# Patient Record
Sex: Female | Born: 1940
Health system: Southern US, Community
[De-identification: ages and names within clinical notes are randomized; demographics above are authoritative.]

## PROBLEM LIST (undated history)

## (undated) DIAGNOSIS — I2510 Atherosclerotic heart disease of native coronary artery without angina pectoris: Secondary | ICD-10-CM

## (undated) DIAGNOSIS — I10 Essential (primary) hypertension: Secondary | ICD-10-CM

## (undated) DIAGNOSIS — K089 Disorder of teeth and supporting structures, unspecified: Secondary | ICD-10-CM

## (undated) DIAGNOSIS — Z720 Tobacco use: Secondary | ICD-10-CM

## (undated) DIAGNOSIS — J45909 Unspecified asthma, uncomplicated: Secondary | ICD-10-CM

## (undated) DIAGNOSIS — F32A Depression, unspecified: Secondary | ICD-10-CM

## (undated) DIAGNOSIS — E079 Disorder of thyroid, unspecified: Secondary | ICD-10-CM

## (undated) DIAGNOSIS — F329 Major depressive disorder, single episode, unspecified: Secondary | ICD-10-CM

## (undated) DIAGNOSIS — J449 Chronic obstructive pulmonary disease, unspecified: Secondary | ICD-10-CM

## (undated) DIAGNOSIS — H269 Unspecified cataract: Secondary | ICD-10-CM

## (undated) HISTORY — PX: THYROID SURGERY: SHX805

## (undated) HISTORY — DX: Unspecified cataract: H26.9

## (undated) HISTORY — DX: Major depressive disorder, single episode, unspecified: F32.9

## (undated) HISTORY — DX: Atherosclerotic heart disease of native coronary artery without angina pectoris: I25.10

## (undated) HISTORY — DX: Unspecified asthma, uncomplicated: J45.909

## (undated) HISTORY — PX: ABDOMINAL HYSTERECTOMY: SHX81

## (undated) HISTORY — DX: Depression, unspecified: F32.A

## (undated) HISTORY — DX: Essential (primary) hypertension: I10

---

## 2011-05-31 ENCOUNTER — Other Ambulatory Visit: Payer: Self-pay | Admitting: Family Medicine

## 2011-05-31 DIAGNOSIS — J449 Chronic obstructive pulmonary disease, unspecified: Secondary | ICD-10-CM

## 2012-10-07 ENCOUNTER — Ambulatory Visit (HOSPITAL_COMMUNITY): Admit: 2012-10-07 | Payer: Self-pay | Admitting: Cardiology

## 2012-10-07 ENCOUNTER — Encounter (HOSPITAL_COMMUNITY): Admission: EM | Disposition: A | Discharge status: Home / Self Care | Payer: Self-pay | Source: Home / Self Care

## 2012-10-07 ENCOUNTER — Encounter (HOSPITAL_COMMUNITY): Payer: Self-pay

## 2012-10-07 ENCOUNTER — Inpatient Hospital Stay (HOSPITAL_COMMUNITY)
Admission: EM | Admit: 2012-10-07 | Discharge: 2012-10-09 | DRG: 282 | Disposition: A | Discharge status: Home / Self Care | Payer: Medicare HMO | Attending: Emergency Medicine | Admitting: Emergency Medicine

## 2012-10-07 DIAGNOSIS — Z7982 Long term (current) use of aspirin: Secondary | ICD-10-CM

## 2012-10-07 DIAGNOSIS — J449 Chronic obstructive pulmonary disease, unspecified: Secondary | ICD-10-CM | POA: Diagnosis present

## 2012-10-07 DIAGNOSIS — Z7902 Long term (current) use of antithrombotics/antiplatelets: Secondary | ICD-10-CM

## 2012-10-07 DIAGNOSIS — Z79899 Other long term (current) drug therapy: Secondary | ICD-10-CM

## 2012-10-07 DIAGNOSIS — I2129 ST elevation (STEMI) myocardial infarction involving other sites: Principal | ICD-10-CM | POA: Diagnosis present

## 2012-10-07 DIAGNOSIS — Z880 Allergy status to penicillin: Secondary | ICD-10-CM

## 2012-10-07 DIAGNOSIS — I10 Essential (primary) hypertension: Secondary | ICD-10-CM | POA: Diagnosis present

## 2012-10-07 DIAGNOSIS — I213 ST elevation (STEMI) myocardial infarction of unspecified site: Secondary | ICD-10-CM

## 2012-10-07 DIAGNOSIS — J4489 Other specified chronic obstructive pulmonary disease: Secondary | ICD-10-CM | POA: Diagnosis present

## 2012-10-07 DIAGNOSIS — F17200 Nicotine dependence, unspecified, uncomplicated: Secondary | ICD-10-CM | POA: Diagnosis present

## 2012-10-07 DIAGNOSIS — Z882 Allergy status to sulfonamides status: Secondary | ICD-10-CM

## 2012-10-07 DIAGNOSIS — E039 Hypothyroidism, unspecified: Secondary | ICD-10-CM | POA: Diagnosis present

## 2012-10-07 DIAGNOSIS — M1990 Unspecified osteoarthritis, unspecified site: Secondary | ICD-10-CM | POA: Diagnosis present

## 2012-10-07 DIAGNOSIS — I2510 Atherosclerotic heart disease of native coronary artery without angina pectoris: Secondary | ICD-10-CM | POA: Diagnosis present

## 2012-10-07 DIAGNOSIS — E7800 Pure hypercholesterolemia, unspecified: Secondary | ICD-10-CM | POA: Diagnosis present

## 2012-10-07 HISTORY — PX: LEFT HEART CATHETERIZATION WITH CORONARY ANGIOGRAM: SHX5451

## 2012-10-07 HISTORY — DX: Chronic obstructive pulmonary disease, unspecified: J44.9

## 2012-10-07 HISTORY — DX: Disorder of thyroid, unspecified: E07.9

## 2012-10-07 LAB — CBC WITH DIFFERENTIAL/PLATELET
Basophils Absolute: 0 10*3/uL (ref 0.0–0.1)
Basophils Relative: 0 % (ref 0–1)
Eosinophils Absolute: 0.1 10*3/uL (ref 0.0–0.7)
Eosinophils Relative: 2 % (ref 0–5)
HCT: 36 % (ref 36.0–46.0)
Hemoglobin: 12.6 g/dL (ref 12.0–15.0)
Lymphocytes Relative: 25 % (ref 12–46)
Lymphs Abs: 1.8 10*3/uL (ref 0.7–4.0)
MCH: 30.7 pg (ref 26.0–34.0)
MCHC: 35 g/dL (ref 30.0–36.0)
MCV: 87.8 fL (ref 78.0–100.0)
Monocytes Absolute: 0.4 10*3/uL (ref 0.1–1.0)
Monocytes Relative: 6 % (ref 3–12)
Neutro Abs: 4.7 10*3/uL (ref 1.7–7.7)
Neutrophils Relative %: 67 % (ref 43–77)
Platelets: 222 10*3/uL (ref 150–400)
RBC: 4.1 MIL/uL (ref 3.87–5.11)
RDW: 13.2 % (ref 11.5–15.5)
WBC: 7.1 10*3/uL (ref 4.0–10.5)

## 2012-10-07 LAB — COMPREHENSIVE METABOLIC PANEL
ALT: 19 U/L (ref 0–35)
AST: 116 U/L — ABNORMAL HIGH (ref 0–37)
Albumin: 3.4 g/dL — ABNORMAL LOW (ref 3.5–5.2)
Alkaline Phosphatase: 87 U/L (ref 39–117)
BUN: 14 mg/dL (ref 6–23)
CO2: 22 mEq/L (ref 19–32)
Calcium: 8.7 mg/dL (ref 8.4–10.5)
Chloride: 106 mEq/L (ref 96–112)
Creatinine, Ser: 0.68 mg/dL (ref 0.50–1.10)
GFR calc Af Amer: >90 mL/min (ref >90)
GFR calc non Af Amer: 87 mL/min — ABNORMAL LOW (ref >90)
Glucose, Bld: 105 mg/dL — ABNORMAL HIGH (ref 70–99)
Potassium: 3.7 mEq/L (ref 3.5–5.1)
Sodium: 137 mEq/L (ref 135–145)
Total Bilirubin: 0.5 mg/dL (ref 0.3–1.2)
Total Protein: 6 g/dL (ref 6.0–8.3)

## 2012-10-07 LAB — POCT I-STAT, CHEM 8
BUN: 17 mg/dL (ref 6–23)
Calcium, Ion: 1.19 mmol/L (ref 1.13–1.30)
Chloride: 109 mEq/L (ref 96–112)
Creatinine, Ser: 0.7 mg/dL (ref 0.50–1.10)
Glucose, Bld: 105 mg/dL — ABNORMAL HIGH (ref 70–99)
HCT: 34 % — ABNORMAL LOW (ref 36.0–46.0)
Hemoglobin: 11.6 g/dL — ABNORMAL LOW (ref 12.0–15.0)
Potassium: 3.5 mEq/L (ref 3.5–5.1)
Sodium: 141 mEq/L (ref 135–145)
TCO2: 19 mmol/L (ref 0–100)

## 2012-10-07 LAB — APTT: aPTT: 40 seconds — ABNORMAL HIGH (ref 24–37)

## 2012-10-07 LAB — POCT ACTIVATED CLOTTING TIME
Activated Clotting Time: 459 seconds
Activated Clotting Time: 129 seconds

## 2012-10-07 LAB — TROPONIN I: Troponin I: >20 ng/mL (ref <0.30)

## 2012-10-07 LAB — PROTIME-INR
INR: 1.14 (ref 0.00–1.49)
Prothrombin Time: 14.4 seconds (ref 11.6–15.2)

## 2012-10-07 LAB — MAGNESIUM: Magnesium: 2 mg/dL (ref 1.5–2.5)

## 2012-10-07 LAB — MRSA PCR SCREENING: MRSA by PCR: NEGATIVE

## 2012-10-07 SURGERY — LEFT HEART CATHETERIZATION WITH CORONARY ANGIOGRAM
Anesthesia: LOCAL

## 2012-10-07 MED ORDER — MIDAZOLAM HCL 2 MG/2ML IJ SOLN
INTRAMUSCULAR | Status: AC
  Filled 2012-10-07: qty 2

## 2012-10-07 MED ORDER — NITROGLYCERIN 0.2 MG/ML ON CALL CATH LAB
INTRAVENOUS | Status: AC
  Filled 2012-10-07: qty 1

## 2012-10-07 MED ORDER — ONDANSETRON HCL 4 MG/2ML IJ SOLN
4.0000 mg | Freq: Four times a day (QID) | INTRAMUSCULAR | Status: DC | PRN
  Administered 2012-10-07: 4 mg via INTRAVENOUS
  Filled 2012-10-07: qty 2

## 2012-10-07 MED ORDER — ASPIRIN 81 MG PO CHEW: 324.0000 mg | CHEWABLE_TABLET | ORAL | Status: AC

## 2012-10-07 MED ORDER — MORPHINE SULFATE 2 MG/ML IJ SOLN
2.0000 mg | Freq: Once | INTRAMUSCULAR | Status: AC
  Administered 2012-10-07: 2 mg via INTRAVENOUS
  Filled 2012-10-07: qty 1

## 2012-10-07 MED ORDER — PANTOPRAZOLE SODIUM 40 MG PO TBEC
40.0000 mg | DELAYED_RELEASE_TABLET | Freq: Every day | ORAL | Status: DC
  Administered 2012-10-08 – 2012-10-09 (×2): 40 mg via ORAL
  Filled 2012-10-07 (×2): qty 1

## 2012-10-07 MED ORDER — ATROPINE SULFATE 1 MG/ML IJ SOLN
INTRAMUSCULAR | Status: AC
  Filled 2012-10-07: qty 1

## 2012-10-07 MED ORDER — ACETAMINOPHEN 325 MG PO TABS: 650.0000 mg | ORAL_TABLET | ORAL | Status: DC | PRN

## 2012-10-07 MED ORDER — ATORVASTATIN CALCIUM 80 MG PO TABS
80.0000 mg | ORAL_TABLET | Freq: Every day | ORAL | Status: DC
  Administered 2012-10-08: 80 mg via ORAL
  Filled 2012-10-07 (×3): qty 1

## 2012-10-07 MED ORDER — LIDOCAINE HCL (PF) 1 % IJ SOLN
INTRAMUSCULAR | Status: AC
  Filled 2012-10-07: qty 30

## 2012-10-07 MED ORDER — SODIUM CHLORIDE 0.9 % IV SOLN
INTRAVENOUS | Status: DC
  Administered 2012-10-07: 17:00:00 via INTRAVENOUS

## 2012-10-07 MED ORDER — NITROGLYCERIN IN D5W 200-5 MCG/ML-% IV SOLN
INTRAVENOUS | Status: AC
  Administered 2012-10-07: 10 ug/min via INTRAVENOUS
  Filled 2012-10-07: qty 250

## 2012-10-07 MED ORDER — HEPARIN SODIUM (PORCINE) 1000 UNIT/ML DIALYSIS
60.0000 [IU]/kg | Freq: Once | INTRAMUSCULAR | Status: AC
  Administered 2012-10-07: 4000 [IU] via INTRAVENOUS

## 2012-10-07 MED ORDER — METOPROLOL TARTRATE 12.5 MG HALF TABLET
12.5000 mg | ORAL_TABLET | Freq: Two times a day (BID) | ORAL | Status: DC
  Administered 2012-10-07 – 2012-10-09 (×4): 12.5 mg via ORAL
  Filled 2012-10-07 (×5): qty 1

## 2012-10-07 MED ORDER — NITROGLYCERIN IN D5W 200-5 MCG/ML-% IV SOLN
10.0000 ug/min | INTRAVENOUS | Status: DC
  Administered 2012-10-07: 10 ug/min via INTRAVENOUS

## 2012-10-07 MED ORDER — RAMIPRIL 5 MG PO CAPS
5.0000 mg | ORAL_CAPSULE | Freq: Every day | ORAL | Status: DC
  Administered 2012-10-07 – 2012-10-09 (×3): 5 mg via ORAL
  Filled 2012-10-07 (×3): qty 1

## 2012-10-07 MED ORDER — ACETAMINOPHEN 325 MG PO TABS
650.0000 mg | ORAL_TABLET | ORAL | Status: DC | PRN
  Administered 2012-10-07 – 2012-10-09 (×3): 650 mg via ORAL
  Filled 2012-10-07: qty 1
  Filled 2012-10-07 (×3): qty 2

## 2012-10-07 MED ORDER — ASPIRIN 300 MG RE SUPP
300.0000 mg | RECTAL | Status: DC
  Filled 2012-10-07: qty 1

## 2012-10-07 MED ORDER — ONDANSETRON HCL 4 MG/2ML IJ SOLN: 4.0000 mg | Freq: Four times a day (QID) | INTRAMUSCULAR | Status: DC | PRN

## 2012-10-07 MED ORDER — ASPIRIN EC 81 MG PO TBEC
81.0000 mg | DELAYED_RELEASE_TABLET | Freq: Every day | ORAL | Status: DC
  Administered 2012-10-08 – 2012-10-09 (×2): 81 mg via ORAL
  Filled 2012-10-07 (×2): qty 1

## 2012-10-07 MED ORDER — NITROGLYCERIN 0.4 MG SL SUBL: 0.4000 mg | SUBLINGUAL_TABLET | SUBLINGUAL | Status: DC | PRN

## 2012-10-07 MED ORDER — FENTANYL CITRATE 0.05 MG/ML IJ SOLN
INTRAMUSCULAR | Status: AC
  Filled 2012-10-07: qty 2

## 2012-10-07 MED ORDER — BIVALIRUDIN 250 MG IV SOLR
INTRAVENOUS | Status: AC
  Filled 2012-10-07: qty 250

## 2012-10-07 NOTE — ED Provider Notes (Signed)
History     CSN: [id number]  Arrival date & time 10/07/12  1418   First MD Initiated Contact with Patient 10/07/12 1438      Chief Complaint  Patient presents with  . Chest Pain     HPI Sudden onset chest pain with nausea and sob  Given asa and ntg enroute   Past Medical History  Diagnosis Date  . Thyroid disease   . COPD (chronic obstructive pulmonary disease)     Past Surgical History  Procedure Date  . Thyroid surgery   . Abdominal hysterectomy     History reviewed. No pertinent family history.  History  Substance Use Topics  . Smoking status: Current Some Day Smoker  . Smokeless tobacco: Not on file  . Alcohol Use: No    OB History    Grav Para Term Preterm Abortions TAB SAB Ect Mult Living                  Review of Systems Level V caveat Allergies  Coconut fatty acids; Penicillins; and Sulfa antibiotics  Home Medications  No current outpatient prescriptions on file.  BP 150/82  Pulse 58  Temp 98 F (36.7 C) (Oral)  Resp 16  Ht 5\' 9"  (1.753 m)  Wt 163 lb 2.3 oz (74 kg)  BMI 24.09 kg/m2  SpO2 98%  Physical Exam  Nursing note and vitals reviewed. Constitutional: She is oriented to person, place, and time. She appears well-developed and well-nourished. No distress.  HENT:  Head: Normocephalic and atraumatic.  Eyes: Pupils are equal, round, and reactive to light.  Neck: Normal range of motion.  Cardiovascular: Normal rate and intact distal pulses.   Pulmonary/Chest: No respiratory distress.  Abdominal: Normal appearance. She exhibits no distension. There is no tenderness. There is no rebound.  Musculoskeletal: Normal range of motion.  Neurological: She is alert and oriented to person, place, and time. No cranial nerve deficit.  Skin: Skin is warm and dry. No rash noted.  Psychiatric: She has a normal mood and affect. Her behavior is normal.    ED Course  Procedures (including critical care time) CRITICAL CARE Performed by:  Nelva Nay L   Total critical care time: 30 min  Critical care time was exclusive of separately billable procedures and treating other patients.  Critical care was necessary to treat or prevent imminent or life-threatening deterioration.  Critical care was time spent personally by me on the following activities: development of treatment plan with patient and/or surrogate as well as nursing, discussions with consultants, evaluation of patient's response to treatment, examination of patient, obtaining history from patient or surrogate, ordering and performing treatments and interventions, ordering and review of laboratory studies, ordering and review of radiographic studies, pulse oximetry and re-evaluation of patient's condition.  Labs Reviewed  POCT I-STAT, CHEM 8 - Abnormal; Notable for the following:    Glucose, Bld 105 (*)     Hemoglobin 11.6 (*)     HCT 34.0 (*)     All other components within normal limits  TROPONIN I - Abnormal; Notable for the following:    Troponin I >20.00 (*)     All other components within normal limits  APTT - Abnormal; Notable for the following:    aPTT 40 (*)     All other components within normal limits  COMPREHENSIVE METABOLIC PANEL - Abnormal; Notable for the following:    Glucose, Bld 105 (*)     Albumin 3.4 (*)     AST 116 (*)  GFR calc non Af Amer 87 (*)     All other components within normal limits  POCT ACTIVATED CLOTTING TIME  PROTIME-INR  CBC WITH DIFFERENTIAL  MAGNESIUM  POCT ACTIVATED CLOTTING TIME  MRSA PCR SCREENING  TROPONIN I  TROPONIN I  TSH  CBC  BASIC METABOLIC PANEL  LIPID PANEL  ekg showed STEMI.  Code stemi called.  Dr Sharyn Lull took pt to cath lab.   No results found.   1. STEMI (ST elevation myocardial infarction)       MDM          Nelia Shi, MD 10/07/12 2356

## 2012-10-07 NOTE — H&P (Signed)
Phyllis Caldwell is an 71 y.o. female.   Chief Complaint: Chest pain  HPI: Patient is 71 year old female with past rectal history significant for hypertension, tobacco abuse, degenerative joint disease, hypothyroidism, came to the ER by EMS complaining of retrosternal chest pain described as pressure associated with nausea and diaphoresis her EKG done on the field showed a sinus bradycardia with ST elevation in lead 1 aVL and respiratory changes in lead 23 aVF and V4 to V6 suggestive of acute high lateral wall injury. Patient received aspirin and heparin and sublingual nitroglycerin in ED with relief of chest pain.Code STEMI  WAS CALLED. Patient denies such episodes of chest pain in the past denies any palpitation lightheadedness or syncope denies PND orthopnea leg swelling states takes blood pressure medicine occasionally off and on but does not recall the name of medications.  Past Medical History  Diagnosis Date  . Thyroid disease     Past Surgical History  Procedure Date  . Thyroid surgery   . Abdominal hysterectomy     History reviewed. No pertinent family history. Social History:  reports that she has been smoking.  She does not have any smokeless tobacco history on file. She reports that she does not drink alcohol. Her drug history not on file.  Allergies:  Allergies  Allergen Reactions  . Penicillins   . Sulfa Antibiotics     No prescriptions prior to admission    Results for orders placed during the hospital encounter of 10/07/12 (from the past 48 hour(s))  POCT ACTIVATED CLOTTING TIME     Status: Normal   Collection Time   10/07/12  3:07 PM      Component Value Range Comment   Activated Clotting Time 459      No results found.  Review of Systems  Constitutional: Negative for fever and weight loss.  Eyes: Negative for blurred vision and double vision.  Respiratory: Positive for shortness of breath. Negative for cough, hemoptysis and sputum production.     Cardiovascular: Positive for chest pain. Negative for palpitations, orthopnea, claudication, leg swelling and PND.  Gastrointestinal: Negative for heartburn, nausea and vomiting.  Neurological: Negative for dizziness and headaches.    Blood pressure 155/103, pulse 43, temperature 97.9 F (36.6 C), temperature source Oral, resp. rate 18, SpO2 99.00%. Physical Exam  Constitutional: She is oriented to person, place, and time. She appears well-developed and well-nourished.  HENT:  Head: Normocephalic and atraumatic.  Eyes: Conjunctivae normal and EOM are normal. Left eye exhibits no discharge.  Neck: Neck supple. No JVD present. No tracheal deviation present. No thyromegaly present.  Cardiovascular: Normal rate and regular rhythm.        Soft systolic murmur and S4 gallop noted  Respiratory: Effort normal and breath sounds normal. No respiratory distress. She has no wheezes. She has no rales.  GI: Soft. Bowel sounds are normal. She exhibits no distension. There is no tenderness. There is no rebound and no guarding.  Musculoskeletal: She exhibits no edema and no tenderness.  Neurological: She is alert and oriented to person, place, and time.     Assessment/Plan Acute high lateral wall myocardial infarction Uncontrolled hypertension Tobacco abuse Hypothyroidism Degenerative joint disease Plan Discussed with patient regarding emergency left cath possible PTCA stenting its risk and benefits i.e. death MI stroke need for emergency CABG local last complications etc. and consents for PCI.  Burnie Therien N 10/07/2012, 3:50 PM

## 2012-10-07 NOTE — CV Procedure (Signed)
Left cardiac cath report dictated on 10/07/2012 dictation number is [id number]

## 2012-10-07 NOTE — Progress Notes (Signed)
Right groin femoral sheath removed .  Pt pain withsheath pull .  Hematoma formed at sheath site.  Two nurses holding pressure.  Pam alvord and self.  Level 0 after holding 25 min.  Dressing applied.  vss throughout sheath pull. Pt c/o nausea during sheath pull.  Bedrest start at 1830pm

## 2012-10-07 NOTE — ED Notes (Signed)
Stemi paged per Dr. Radford Pax.

## 2012-10-07 NOTE — Progress Notes (Signed)
10/07/12 2100  Clinical Encounter Type  Visited With Patient  Visit Type Critical Care     10/07/12 2100  Clinical Encounter Type  Visited With Patient  Visit Type Critical Care   Visited patient in the CCU.  Vonzella Nipple, Chaplain.

## 2012-10-07 NOTE — ED Notes (Signed)
Pt from home at rest, right chest pain with nausea and SOB

## 2012-10-08 LAB — CBC
HCT: 34.7 % — ABNORMAL LOW (ref 36.0–46.0)
Hemoglobin: 11.6 g/dL — ABNORMAL LOW (ref 12.0–15.0)
MCH: 29.9 pg (ref 26.0–34.0)
MCHC: 33.4 g/dL (ref 30.0–36.0)
MCV: 89.4 fL (ref 78.0–100.0)
Platelets: 219 10*3/uL (ref 150–400)
RBC: 3.88 MIL/uL (ref 3.87–5.11)
RDW: 13.5 % (ref 11.5–15.5)
WBC: 8.6 10*3/uL (ref 4.0–10.5)

## 2012-10-08 LAB — BASIC METABOLIC PANEL
BUN: 14 mg/dL (ref 6–23)
CO2: 22 mEq/L (ref 19–32)
Calcium: 9.1 mg/dL (ref 8.4–10.5)
Chloride: 106 mEq/L (ref 96–112)
Creatinine, Ser: 0.84 mg/dL (ref 0.50–1.10)
GFR calc Af Amer: 80 mL/min — ABNORMAL LOW (ref >90)
GFR calc non Af Amer: 69 mL/min — ABNORMAL LOW (ref >90)
Glucose, Bld: 101 mg/dL — ABNORMAL HIGH (ref 70–99)
Potassium: 3.6 mEq/L (ref 3.5–5.1)
Sodium: 139 mEq/L (ref 135–145)

## 2012-10-08 LAB — TROPONIN I
Troponin I: >20 ng/mL (ref <0.30)
Troponin I: >20 ng/mL (ref <0.30)

## 2012-10-08 LAB — LIPID PANEL
Cholesterol: 161 mg/dL (ref 0–200)
HDL: 40 mg/dL (ref >39)
LDL Cholesterol: 97 mg/dL (ref 0–99)
Total CHOL/HDL Ratio: 4 RATIO
Triglycerides: 120 mg/dL (ref <150)
VLDL: 24 mg/dL (ref 0–40)

## 2012-10-08 LAB — TSH: TSH: 0.879 u[IU]/mL (ref 0.350–4.500)

## 2012-10-08 MED ORDER — CLOPIDOGREL BISULFATE 75 MG PO TABS
75.0000 mg | ORAL_TABLET | Freq: Every day | ORAL | Status: DC
  Administered 2012-10-08 – 2012-10-09 (×2): 75 mg via ORAL
  Filled 2012-10-08 (×2): qty 1

## 2012-10-08 MED FILL — Dextrose Inj 5%: INTRAVENOUS | Qty: 50 | Status: AC

## 2012-10-08 NOTE — Progress Notes (Addendum)
Pt having active chest tightness at 1000. Will f/u tomorrow.  Ethelda Chick CES, ACSM

## 2012-10-08 NOTE — Op Note (Signed)
NAMEROZELIA, CATAPANO NO.:  [id number]  MEDICAL RECORD NO.:  [id number]  LOCATION:  2901                         FACILITY:  [facility]  PHYSICIAN:  Eduardo Osier. Sharyn Lull, M.D. DATE OF BIRTH:  [birth year]  DATE OF PROCEDURE:  10/07/2012 DATE OF DISCHARGE:                              OPERATIVE REPORT   PROCEDURE:  Left cardiac cath with selective left and right coronary angiography, left ventriculography via right groin using Judkins technique.  INDICATION FOR THE PROCEDURE:  Ms. Pember is a 71 year old female with past medical history significant for hypertension, tobacco abuse, degenerative joint disease, hypothyroidism came to the ER by EMS complaining of retrosternal chest pain, described as pressure associated with nausea and diaphoresis.  EKG done on the field showed sinus bradycardia with ST elevation in lead 1, aVL, and reciprocal changes in lead II, III, aVF and V4 to V6, suggestive of acute bilateral wall injury.  The patient received aspirin, heparin, sublingual nitro in the ED with relief of chest pain.  Code STEMI was called.  The patient denies such episodes of chest pain in the past.  Denies any palpitation, lightheadedness or syncope.  Denies PND, orthopnea, or leg swelling. States she takes some blood pressure medication occasionally off and on. Due to typical anginal chest pain and EKG changes suggestive of acute high lateral wall MI, discussed with the patient regarding emergency left cath, possible PTCA, stenting, its risks and benefits i.e. death, MI, stroke, need for emergency CABG, local vascular complications etc and consented for the procedure.  PROCEDURE IN DETAIL:  After obtaining the informed consent, the patient was brought to the cath lab and was placed on fluoroscopy table.  Right groin was prepped and draped in usual fashion.  Xylocaine 1% was used for local anesthesia in the right groin.  With the help of thin wall needle, 6-French  arterial sheath was placed.  The sheath was aspirated and flushed.  Next, 6-French right Judkins catheter was advanced over the wire under fluoroscopic guidance up to the ascending aorta.  Wire was pulled out.  The catheter was aspirated and connected to the Manifold.  Catheter was further advanced and engaged into right coronary ostium.  A single view of right coronary artery was obtained.  Next, the catheter was disengaged and was pulled out over the wire and was replaced with 6-French 3.5 XB LAD guiding catheter which was advanced over the wire under fluoroscopic guidance up to the ascending aorta. Wire was pulled out.  The catheter was aspirated and connected to the Manifold.  Catheter was further advanced and engaged into left coronary ostium.  Multiple views of the left system were taken.  Next, catheter was disengaged and was pulled out over the wire and was replaced with 6- French pigtail catheter which was advanced over the wire under fluoroscopic guidance up to the ascending aorta.  Wire was pulled out. The catheter was aspirated and connected to the Manifold.  Catheter was further advanced across the aortic valve into the LV.  LV pressures were recorded.  Next, LV graft was done in 30-degree RAO position.  Post angiographic pressures were recorded from LV and then pullback pressures were recorded from  the aorta.  There was no gradient across the aortic valve.  Next, the pigtail catheter was pulled out over the wire. Sheaths were aspirated and flushed.  FINDINGS:  LV showed mild lateral wall hypokinesia,  good LV systolic function, LVH, EF of 09-81%.  Left main was patent.  LAD has 30-40% mid and distal stenosis.  Diagonal 1 branches in the proximal portion close to LAD into superior and inferior branch.  Superior branch is large which is patent.  Inferior branch is very small which is 100% occluded right after the bifurcation which is the culprit vessel for small high lateral  wall MI.  This vessel was felt not suitable for PCI.  Diagonal 2 was small which was patent.  Left circumflex has 20-25% proximal stenosis and then tapers down and diffusely diseased in AV groove.  OM 1 is very very small.  OM 2 is very small which is diffusely diseased.  OM 3 is moderate size which has mild disease.  RCA has 20-25% proximal and 15-20% mid stenosis.  PDA has 60-70% ostial stenosis which is smooth with TIMI grade 3 distal flow.  PLV branches have mild disease.  The patient tolerated the procedure well.  There were no complications. Plan is to treat medically.  The patient is chest pain-free during the procedure.  The patient tolerated procedure well.  There were no complications.  The patient was transferred to recovery room in stable condition.     Eduardo Osier. Sharyn Lull, M.D.     MNH/MEDQ  D:  10/07/2012  T:  10/08/2012  Job:  [id number]

## 2012-10-08 NOTE — Progress Notes (Signed)
Chaplain visited patient at CICU Room 2901. Patient was referred by On call Chaplain. Patient had experienced chest pain. Patient was responsive, alert and was lying on her back in bed during Chaplain's visit. Patient was in the process of being transferred to a Step down Unit. Chaplain shared words of hope, comfort and encouragement with patient. Chaplain gave a copy of New Testament to patient upon her request. Chaplain also listened empathetically to patient's cares and concerns. Patient expressed her appreciation for Chaplain's visit and spiritual support. Chaplain will continue to visit and provide spiritual care as needed to both patient and family.

## 2012-10-08 NOTE — Progress Notes (Signed)
Subjective:  Patient complains of mild soreness in the chest without associated symptoms overall feels better. Denies any palpitation lightheadedness or shortness of breath  Objective:  Vital Signs in the last 24 hours: Temp:  [97.7 F (36.5 C)-98.5 F (36.9 C)] 98.5 F (36.9 C) (10/29 0400) Pulse Rate:  [43-106] 54  (10/29 0700) Resp:  [10-20] 14  (10/29 0700) BP: (106-160)/(62-110) 118/69 mmHg (10/29 0600) SpO2:  [94 %-100 %] 97 % (10/29 0700) Weight:  [74 kg (163 lb 2.3 oz)] 74 kg (163 lb 2.3 oz) (10/28 1709)  Intake/Output from previous day: 10/28 0701 - 10/29 0700 In: 955.4 [P.O.:100; I.V.:855.4] Out: 600 [Urine:600] Intake/Output from this shift:    Physical Exam: Neck: no adenopathy, no carotid bruit, no JVD and supple, symmetrical, trachea midline Lungs: clear to auscultation bilaterally Heart: regular rate and rhythm, S1, S2 normal and Soft systolic murmur noted Abdomen: soft, non-tender; bowel sounds normal; no masses,  no organomegaly Extremities: extremities normal, atraumatic, no cyanosis or edema and Right groin is stable  Lab Results:  Basename 10/08/12 0500 10/07/12 1743  WBC 8.6 7.1  HGB 11.6* 12.6  PLT 219 222    Basename 10/08/12 0500 10/07/12 1743  NA 139 137  K 3.6 3.7  CL 106 106  CO2 22 22  GLUCOSE 101* 105*  BUN 14 14  CREATININE 0.84 0.68    Basename 10/07/12 2328 10/07/12 1738  TROPONINI >20.00* >20.00*   Hepatic Function Panel  Basename 10/07/12 1743  PROT 6.0  ALBUMIN 3.4*  AST 116*  ALT 19  ALKPHOS 87  BILITOT 0.5  BILIDIR --  IBILI --    Basename 10/08/12 0500  CHOL 161   No results found for this basename: PROTIME in the last 72 hours  Imaging: Imaging results have been reviewed and No results found.  Cardiac Studies:  Assessment/Plan:  Status post high lateral wall myocardial infarction with occlusion of her very small branch of diagonal 1. Multivessel small vessel disease Hypertension Tobacco  abuse Hypothyroidism Degenerative joint disease Plan Continue present management Add Plavix as per orders Phase I cardiac rehabilitation Okay to ambulate Check labs in a.m.  LOS: 1 day    Phyllis Caldwell N 10/08/2012, 8:02 AM

## 2012-10-08 NOTE — Care Management Note (Addendum)
    Page 1 of 1   10/09/2012     9:35:22 AM   CARE MANAGEMENT NOTE 10/09/2012  Patient:  Phyllis Caldwell, Phyllis Caldwell   Account Number:  [id number]  Date Initiated:  10/08/2012  Documentation initiated by:  Junius Creamer  Subjective/Objective Assessment:   adm w mi     Action/Plan:   lives w fam-grandson, da   Anticipated DC Date:     Anticipated DC Plan:  HOME/SELF CARE      DC Planning Services  CM consult      Choice offered to / List presented to:             Status of service:   Medicare Important Message given?   (If response is "NO", the following Medicare IM given date fields will be blank) Date Medicare IM given:   Date Additional Medicare IM given:    Discharge Disposition:  HOME/SELF CARE  Per UR Regulation:  Reviewed for med. necessity/level of care/duration of stay  If discussed at Long Length of Stay Meetings, dates discussed:    Comments:  10/30 9:33a debbie Sharae Zappulla rn,bsn spoke w pt. she lives [place] grandson who [place]. her da and her fam also moved in w pt. she does not anticipate any need for hhc. she has [employer] ins which she has copay for meds.  10/29 9:43a debbie Noela Brothers rn,bsn [phone]

## 2012-10-09 LAB — CBC
HCT: 33.7 % — ABNORMAL LOW (ref 36.0–46.0)
Hemoglobin: 11.7 g/dL — ABNORMAL LOW (ref 12.0–15.0)
MCH: 30.9 pg (ref 26.0–34.0)
MCHC: 34.7 g/dL (ref 30.0–36.0)
MCV: 88.9 fL (ref 78.0–100.0)
Platelets: 197 10*3/uL (ref 150–400)
RBC: 3.79 MIL/uL — ABNORMAL LOW (ref 3.87–5.11)
RDW: 13.3 % (ref 11.5–15.5)
WBC: 8.4 10*3/uL (ref 4.0–10.5)

## 2012-10-09 LAB — BASIC METABOLIC PANEL
BUN: 18 mg/dL (ref 6–23)
CO2: 22 mEq/L (ref 19–32)
Calcium: 9.2 mg/dL (ref 8.4–10.5)
Chloride: 109 mEq/L (ref 96–112)
Creatinine, Ser: 0.89 mg/dL (ref 0.50–1.10)
GFR calc Af Amer: 74 mL/min — ABNORMAL LOW (ref >90)
GFR calc non Af Amer: 64 mL/min — ABNORMAL LOW (ref >90)
Glucose, Bld: 101 mg/dL — ABNORMAL HIGH (ref 70–99)
Potassium: 3.4 mEq/L — ABNORMAL LOW (ref 3.5–5.1)
Sodium: 140 mEq/L (ref 135–145)

## 2012-10-09 LAB — CK TOTAL AND CKMB (NOT AT ARMC)
CK, MB: 9.2 ng/mL (ref 0.3–4.0)
Relative Index: 5.9 — ABNORMAL HIGH (ref 0.0–2.5)
Total CK: 155 U/L (ref 7–177)

## 2012-10-09 LAB — TROPONIN I: Troponin I: 10.47 ng/mL (ref <0.30)

## 2012-10-09 MED ORDER — CLOPIDOGREL BISULFATE 75 MG PO TABS: 75.0000 mg | ORAL_TABLET | Freq: Every day | ORAL | Status: DC

## 2012-10-09 MED ORDER — ASPIRIN 81 MG PO TBEC: 81.0000 mg | DELAYED_RELEASE_TABLET | Freq: Every day | ORAL | Status: DC

## 2012-10-09 MED ORDER — NITROGLYCERIN 0.4 MG SL SUBL: 0.4000 mg | SUBLINGUAL_TABLET | SUBLINGUAL | Status: DC | PRN

## 2012-10-09 MED ORDER — RAMIPRIL 5 MG PO CAPS: 5.0000 mg | ORAL_CAPSULE | Freq: Every day | ORAL | Status: DC

## 2012-10-09 MED ORDER — ATORVASTATIN CALCIUM 20 MG PO TABS: 20.0000 mg | ORAL_TABLET | Freq: Every day | ORAL | Status: DC

## 2012-10-09 MED ORDER — PANTOPRAZOLE SODIUM 40 MG PO TBEC: 40.0000 mg | DELAYED_RELEASE_TABLET | Freq: Every day | ORAL | Status: DC

## 2012-10-09 MED ORDER — METOPROLOL TARTRATE 12.5 MG HALF TABLET: 12.5000 mg | ORAL_TABLET | Freq: Two times a day (BID) | ORAL | Status: DC

## 2012-10-09 NOTE — Progress Notes (Signed)
Patient being discharged with medication and discharge instructions.

## 2012-10-09 NOTE — Progress Notes (Signed)
CARDIAC REHAB PHASE I   PRE:  Rate/Rhythm:58 SR    BP: sitting 126/84    SaO2: 95 RA  MODE:  Ambulation: 700 ft   POST:  Rate/Rhythm: 65 SR    BP: sitting 127/69     SaO2: 100 RA  Tolerated very well. No c/o. Anxious to be up and moving (had walked last night). Ed completed. Pt sts she can quit smoking, has multiple times before, cold [place]. Gave resources. Interested in [facility] and requests her name be sent to [facility] although it will depend if she can get transportation. Told pt about [facility] program. [phone]  Harriet Masson CES, ACSM

## 2012-10-09 NOTE — Discharge Summary (Signed)
  Discharge summary dictated on 10/09/2012 dictation number is [phone]

## 2012-10-10 NOTE — Discharge Summary (Signed)
Phyllis Caldwell, Phyllis Caldwell NO.:  [id number]  MEDICAL RECORD NO.:  [id number]  LOCATION:  2901                         FACILITY:  [facility]  PHYSICIAN:  Eduardo Osier. Sharyn Lull, M.D. DATE OF BIRTH:  11/24/1941  DATE OF ADMISSION:  10/07/2012 DATE OF DISCHARGE:  10/09/2012                              DISCHARGE SUMMARY   ADMITTING DIAGNOSES: 1. Acute high lateral wall myocardial infarction. 2. Uncontrolled hypertension. 3. Tobacco abuse. 4. Hypothyroidism. 5. Degenerative joint disease.  DISCHARGE DIAGNOSES: 1. Status post acute high lateral wall myocardial infarction due to     occlusion of small branch of the diagonal 1, status post left     catheterization. 2. Hypertension. 3. Hypercholesteremia. 4. Hypothyroidism. 5. History of tobacco abuse. 6. Degenerative joint disease.  DISCHARGE HOME MEDICATIONS: 1. Enteric-coated aspirin 81 mg 1 tablet daily. 2. Atorvastatin 20 mg 1 tablet daily. 3. Clopidogrel 75 mg 1 tablet daily. 4. Lopressor 12.5 mg twice daily. 5. Nitrostat 0.4 mg sublingual use as directed. 6. Protonix 40 mg 1 tablet daily. 7. Ramipril 5 mg 1 capsule daily. 8. Synthroid 50 mcg 1 tablet daily.  DIET:  Low salt, low cholesterol, ovoid sweets.  Post cath instructions have been given.  The patient will be scheduled for phase 2 cardiac rehab as outpatient.  CONDITION AT DISCHARGE:  Stable.  BRIEF HISTORY AND HOSPITAL COURSE:  Phyllis Caldwell is a 71 year old female with past medical history significant for hypertension, tobacco abuse, degenerative joint disease, hypothyroidism.  She came to the ER by EMS complaining of retrosternal chest pain described as pressure, associated with nausea, diaphoresis.  EKG done on the field shows sinus bradycardia with ST elevation in lead 1 and aVL with reciprocal changes in leads 2, 3, aVF and V4 to V6 suggestive of acute high lateral wall injury.  The patient received aspirin, heparin, and sublingual nitroglycerin in  the ED with relief of chest pain.  Code STEMI was called.  The patient denies any such episodes of chest pain in the past.  Denies any palpitation, lightheadedness, or syncope.  Denies PND, orthopnea, or leg swelling.  States she takes her blood pressure medication occasionally off and on, but does not recall the name of the medication.  PAST MEDICAL HISTORY:  As above.  PAST SURGICAL HISTORY:  She had abdominal hysterectomy and subtotal thyroidectomy in the past.  ALLERGIES:  She is allergic to PENICILLIN and SULFA.  PHYSICAL EXAMINATION:  VITAL SIGNS:  Her blood pressure was 155/103, pulse was 43.  She was afebrile. HEENT:  Conjunctiva was pink. NECK:  Supple.  No JVD.  No bruit. LUNGS:  Clear to auscultation without rhonchi or rales. CARDIOVASCULAR:  S1, S2 was normal.  There was soft systolic murmur and S4 gallop noted. ABDOMEN:  Soft.  Bowel sounds were present.  Nontender. EXTREMITIES:  There was no clubbing, cyanosis, or edema.  LABORATORY DATA:  Sodium was 141, potassium 3.5, BUN 17, creatinine 0.70, glucose was 105.  Repeat fasting sugar was 101.  Troponin-I first three sets were above 20.  Today, troponin-I is 10.47.  CK-MB; CK is 155, MB is 9.2.  Today, her sodium is 140, potassium 3.4, which has been replaced.  BUN  is 18, creatinine 0.89.  Hemoglobin is 11.7, hematocrit 33.7, white count of 8.4, which is stable.  Her TSH was 0.87, which was in therapeutic range.  BRIEF HOSPITAL COURSE:  The patient was directly taken to the Cath Lab and underwent left cardiac cath with selective left and right coronary angiography and left ventriculography as per procedure report.  The patient was noted to have occluded branch of diagonal 1, which was small branch and was felt not suitable for PCI.  The patient remained chest pain free during the procedure and afterwards.  The patient did not have any episodes of chest pain during the hospital stay.  Her groin is stable with minimal  area of ecchymosis with no hematoma or bruit.  Phase 1 cardiac rehab was called.  The patient has been ambulating in hallway without any problems.  The patient will be scheduled for phase 2 cardiac rehab as outpatient.     Eduardo Osier. Sharyn Lull, M.D.     MNH/MEDQ  D:  10/09/2012  T:  10/10/2012  Job:  [id number]

## 2013-12-24 ENCOUNTER — Ambulatory Visit: Payer: Medicare HMO | Admitting: Interventional Cardiology

## 2013-12-30 ENCOUNTER — Encounter: Payer: Self-pay | Admitting: Interventional Cardiology

## 2013-12-30 ENCOUNTER — Encounter (INDEPENDENT_AMBULATORY_CARE_PROVIDER_SITE_OTHER): Payer: Self-pay

## 2013-12-30 ENCOUNTER — Ambulatory Visit (INDEPENDENT_AMBULATORY_CARE_PROVIDER_SITE_OTHER): Payer: Medicare HMO | Admitting: Interventional Cardiology

## 2013-12-30 VITALS — BP 120/78 | HR 60 | Ht 69.0 in | Wt 163.0 lb

## 2013-12-30 DIAGNOSIS — F17200 Nicotine dependence, unspecified, uncomplicated: Secondary | ICD-10-CM

## 2013-12-30 DIAGNOSIS — Z720 Tobacco use: Secondary | ICD-10-CM

## 2013-12-30 DIAGNOSIS — I2510 Atherosclerotic heart disease of native coronary artery without angina pectoris: Secondary | ICD-10-CM | POA: Insufficient documentation

## 2013-12-30 DIAGNOSIS — I10 Essential (primary) hypertension: Secondary | ICD-10-CM

## 2013-12-30 DIAGNOSIS — I252 Old myocardial infarction: Secondary | ICD-10-CM

## 2013-12-30 DIAGNOSIS — J449 Chronic obstructive pulmonary disease, unspecified: Secondary | ICD-10-CM

## 2013-12-30 MED ORDER — NITROGLYCERIN 0.4 MG SL SUBL: 0.4000 mg | SUBLINGUAL_TABLET | SUBLINGUAL | Status: DC | PRN

## 2013-12-30 NOTE — Patient Instructions (Signed)
Your physician recommends that you continue on your current medications as directed. Please refer to the Current Medication list given to you today.  A refill for Nitro has been sent to your pharmacy  Your physician wants you to follow-up in: 1year You will receive a reminder letter in the mail two months in advance. If you don't receive a letter, please call our office to schedule the follow-up appointment.

## 2013-12-30 NOTE — Progress Notes (Signed)
Patient ID: Phyllis Caldwell, female   DOB: 1941-08-30, 73 y.o.   MRN: [id number] Medical History: HTN, Cataracts, COPD, Asthma, Surgical hypothyroidism, Depression, DECLINES flu/pneumococcal/zostavax, DECLINES mammogram/colonoscopy, STEMI, CAD, 09/2012. Infarct was do to occlusion of a diagonal branch and intervention not performed , Arthritis.     [address] [place], [place]  [address] Phone: [phone] Fax:  [phone]  Date:  12/30/2013   ID:  Phyllis Caldwell, DOB 03-12-1941, MRN [id number]  PCP:  Emeterio Reeve, MD   ASSESSMENT:  1. Coronary artery disease with history of lateral infarction due to diagonal occlusion October 2013. Stable angina 2. Tobacco abuse, continuous 3. COPD 4. Hypertension  PLAN:  1. Encouraged cigarette smoking cessation 2. Continue active lifestyle including walking 3. Refill nitroglycerin 4. Clinical followup in one year. Earlier if symptoms recur.   SUBJECTIVE: Phyllis Caldwell is a 73 y.o. female with history of prior lateral wall infarct due to diagonal occlusion. She has occasional episodes of angina. She continues to smoke cigarettes. She walks on a near daily basis and has not experienced significant angina. Over the past year in several months she is use nitroglycerin maybe 3 times. She denies orthopnea, PND, palpitations, and syncope. There is no peripheral edema.   Wt Readings from Last 3 Encounters:  12/30/13 163 lb (73.936 kg)  10/07/12 163 lb 2.3 oz (74 kg)  10/07/12 163 lb 2.3 oz (74 kg)     Past Medical History  Diagnosis Date  . Thyroid disease   . COPD (chronic obstructive pulmonary disease)     Current Outpatient Prescriptions  Medication Sig Dispense Refill  . amLODipine (NORVASC) 5 MG tablet TAKE ONE TABLET DAILY      . aspirin EC 81 MG EC tablet Take 1 tablet (81 mg total) by mouth daily.  30 tablet  3  . atorvastatin (LIPITOR) 20 MG tablet Take 1 tablet (20 mg total) by mouth daily at 6 PM.  30 tablet   3  . HYDROcodone-acetaminophen (NORCO/VICODIN) 5-325 MG per tablet TAKE ONE PILL AS NEEDED      . levothyroxine (SYNTHROID, LEVOTHROID) 50 MCG tablet Take 50 mcg by mouth daily.      . nitroGLYCERIN (NITROSTAT) 0.4 MG SL tablet Place 1 tablet (0.4 mg total) under the tongue every 5 (five) minutes x 3 doses as needed for chest pain.  25 tablet  3  . pantoprazole (PROTONIX) 40 MG tablet Take 1 tablet (40 mg total) by mouth daily at 12 noon.  30 tablet  3   No current facility-administered medications for this visit.    Allergies:    Allergies  Allergen Reactions  . Coconut Fatty Acids     unknown  . Penicillins     unknown  . Sulfa Antibiotics     unknown    Social History:  The patient  reports that she has been smoking.  She does not have any smokeless tobacco history on file. She reports that she does not drink alcohol.   ROS:  Please see the history of present illness.   No palpitations. No syncope. Appetite is stable. Denies hemoptysis. Denies weight loss.   All other systems reviewed and negative.   OBJECTIVE: VS:  BP 120/78  Pulse 60  Ht 5\' 9"  (1.753 m)  Wt 163 lb (73.936 kg)  BMI 24.06 kg/m2 Well nourished, well developed, in no acute distress, smells of cigarette smoke HEENT: normal Neck: JVD flat. Carotid bruit absent  Cardiac:  normal  S1, S2; RRR; no murmur Lungs:  clear to auscultation bilaterally, no wheezing, rhonchi or rales Abd: soft, nontender, no hepatomegaly Ext: Edema absent. Pulses 2+ bilateral Skin: warm and dry Neuro:  CNs 2-12 intact, no focal abnormalities noted  EKG:  Normal sinus rhythm with nonspecific T wave flattening. Sinus bradycardia.       Signed, Darci NeedleHenry W. B. Arnola Crittendon III, MD 12/30/2013 1:54 PM

## 2014-11-19 ENCOUNTER — Encounter (HOSPITAL_COMMUNITY): Payer: Self-pay | Admitting: Cardiology

## 2014-12-31 DIAGNOSIS — E559 Vitamin D deficiency, unspecified: Secondary | ICD-10-CM | POA: Diagnosis not present

## 2014-12-31 DIAGNOSIS — M129 Arthropathy, unspecified: Secondary | ICD-10-CM | POA: Diagnosis not present

## 2014-12-31 DIAGNOSIS — E039 Hypothyroidism, unspecified: Secondary | ICD-10-CM | POA: Diagnosis not present

## 2015-02-23 DIAGNOSIS — R609 Edema, unspecified: Secondary | ICD-10-CM | POA: Diagnosis not present

## 2015-02-23 DIAGNOSIS — R1310 Dysphagia, unspecified: Secondary | ICD-10-CM | POA: Diagnosis not present

## 2015-02-26 ENCOUNTER — Other Ambulatory Visit: Payer: Self-pay | Admitting: Family Medicine

## 2015-02-26 DIAGNOSIS — R1310 Dysphagia, unspecified: Secondary | ICD-10-CM

## 2015-03-03 ENCOUNTER — Inpatient Hospital Stay: Admission: RE | Admit: 2015-03-03 | Payer: Medicare HMO | Source: Ambulatory Visit

## 2015-03-08 ENCOUNTER — Encounter: Payer: Self-pay | Admitting: Interventional Cardiology

## 2015-03-08 ENCOUNTER — Ambulatory Visit (INDEPENDENT_AMBULATORY_CARE_PROVIDER_SITE_OTHER): Payer: Commercial Managed Care - HMO | Admitting: Interventional Cardiology

## 2015-03-08 VITALS — BP 126/88 | HR 58 | Ht 69.0 in | Wt 158.4 lb

## 2015-03-08 DIAGNOSIS — I2510 Atherosclerotic heart disease of native coronary artery without angina pectoris: Secondary | ICD-10-CM | POA: Diagnosis not present

## 2015-03-08 DIAGNOSIS — Z720 Tobacco use: Secondary | ICD-10-CM

## 2015-03-08 DIAGNOSIS — I252 Old myocardial infarction: Secondary | ICD-10-CM | POA: Diagnosis not present

## 2015-03-08 DIAGNOSIS — I10 Essential (primary) hypertension: Secondary | ICD-10-CM | POA: Diagnosis not present

## 2015-03-08 MED ORDER — NITROGLYCERIN 0.4 MG SL SUBL: 0.4000 mg | SUBLINGUAL_TABLET | SUBLINGUAL | Status: DC | PRN

## 2015-03-08 NOTE — Patient Instructions (Signed)
Your physician recommends that you continue on your current medications as directed. Please refer to the Current Medication list given to you today.  Your physician has requested that you have a lexiscan myoview. For further information please visit [email or web address]. Please follow instruction sheet, as given.   Your physician wants you to follow-up in: 1 year with Dr.Smith You will receive a reminder letter in the mail two months in advance. If you don't receive a letter, please call our office to schedule the follow-up appointment.  

## 2015-03-08 NOTE — Progress Notes (Signed)
Cardiology Office Note   Date:  03/08/2015   ID:  Phyllis Caldwell, DOB [birth year], MRN [id number]  PCP:  Emeterio Reeve, MD  Cardiologist:   Lesleigh Noe, MD   No chief complaint on file.     History of Present Illness: Phyllis Caldwell is a 75 y.o. female who presents for 74 year old female with history of prior myocardial infarction. She had an anterior wall infarct due to occlusion of a small anterolateral branch. PCI was not performed. She had 30-40% distal left main and scattered obstructions in both the LAD and circumflex all less than 50%. There was a 60-70% ostial PDA noted.  She now notes chest tightness with emotional of set. Climbing stairs causes dyspnea and mild chest tightness. She has had no prolonged episodes of discomfort. She has not typically use nitroglycerin for the discomfort.    Past Medical History  Diagnosis Date  . Thyroid disease   . COPD (chronic obstructive pulmonary disease)   . Hypertension   . Coronary artery disease   . Cataract   . Asthma     Past Surgical History  Procedure Laterality Date  . Thyroid surgery    . Abdominal hysterectomy    . Left heart catheterization with coronary angiogram N/A 10/07/2012    Procedure: LEFT HEART CATHETERIZATION WITH CORONARY ANGIOGRAM;  Surgeon: Robynn Pane, MD;  Location: [facility] CATH LAB;  Service: Cardiovascular;  Laterality: N/A;     Current Outpatient Prescriptions  Medication Sig Dispense Refill  . ALBUTEROL SULFATE HFA IN Inhale into the lungs as needed.    Marland Kitchen amLODipine (NORVASC) 5 MG tablet TAKE ONE TABLET DAILY    . aspirin EC 81 MG EC tablet Take 1 tablet (81 mg total) by mouth daily. 30 tablet 3  . atorvastatin (LIPITOR) 20 MG tablet Take 1 tablet (20 mg total) by mouth daily at 6 PM. 30 tablet 3  . HYDROcodone-acetaminophen (NORCO/VICODIN) 5-325 MG per tablet TAKE ONE PILL AS NEEDED    . Ipratropium-Albuterol (COMBIVENT IN) Inhale into the lungs as needed.    Marland Kitchen levothyroxine  (SYNTHROID, LEVOTHROID) 50 MCG tablet Take 50 mcg by mouth daily.    . nitroGLYCERIN (NITROSTAT) 0.4 MG SL tablet Place 1 tablet (0.4 mg total) under the tongue every 5 (five) minutes x 3 doses as needed for chest pain. 25 tablet 4  . pantoprazole (PROTONIX) 40 MG tablet Take 1 tablet (40 mg total) by mouth daily at 12 noon. 30 tablet 3   No current facility-administered medications for this visit.    Allergies:   Coconut fatty acids; Penicillins; and Sulfa antibiotics    Social History:  The patient  reports that she has quit smoking. She does not have any smokeless tobacco history on file. She reports that she does not drink alcohol.   Family History:  The patient's family history includes Heart attack in her father and mother; Heart disease in her mother; Prostate cancer in her father; Stroke in her mother.    ROS:  Please see the history of present illness.   Otherwise, review of systems are positive for has occasional lower extremity swelling, vision disturbance, cough, dyspnea on exertion, bilateral hip and back discomfort, occasional dizziness, and excessive fatigue..   All other systems are reviewed and negative.    PHYSICAL EXAM: VS:  BP 126/88 mmHg  Pulse 58  Ht  (1.753 m)  Wt 158 lb 6.4 oz (71.85 kg)  BMI 23.38 kg/m2 , BMI Body mass index  is 23.38 kg/(m^2). GEN: Well nourished, well developed, in no acute distress HEENT: Terrible dental health with multiple caries noted Neck: no JVD, carotid bruits, or masses Cardiac: RRR; no murmurs, rubs, or gallops,no edema  Respiratory:  clear to auscultation bilaterally, normal work of breathing GI: soft, nontender, nondistended, + BS MS: no deformity or atrophy Skin: warm and dry, no rash Neuro:  Strength and sensation are intact Psych: euthymic mood, full affect   EKG:  EKG is ordered today. The ekg ordered today demonstrates sinus bradycardia but otherwise normal   Recent Labs: No results found for requested labs  within last 365 days.    Lipid Panel    Component Value Date/Time   CHOL 161 10/08/2012 0500   TRIG 120 10/08/2012 0500   HDL 40 10/08/2012 0500   CHOLHDL 4.0 10/08/2012 0500   VLDL 24 10/08/2012 0500   LDLCALC 97 10/08/2012 0500      Wt Readings from Last 3 Encounters:  03/08/15 158 lb 6.4 oz (71.85 kg)  12/30/13 163 lb (73.936 kg)  10/07/12 163 lb 2.3 oz (74 kg)      Other studies Reviewed: Additional studies/ records that were reviewed today include: Reviewed the angiogram from Dr Sharyn LullHarwani from 2013. Results outlined above.. Review of the above records demonstrates: Noted above.   ASSESSMENT AND PLAN:  CAD in native artery: With angina. Rule out progression of native disease.  Essential hypertension: Controlled  Old MI (myocardial infarction)  Tobacco use: Denies continued use     Current medicines are reviewed at length with the patient today.  The patient has concerns regarding medicines.  The following changes have been made:  We discussed the exact need an indication for each medication. I discouraged use of nonsteroidal anti-inflammatory drugs  Labs/ tests ordered today include:   Orders Placed This Encounter  Procedures  . Myocardial Perfusion Imaging  . EKG 12-Lead     Disposition:   FU with Mendel RyderH. Smith in one year. Earlier if myocardial perfusion study is moderate to high risk.   Signed, Lesleigh NoeSMITH III,HENRY W, MD  03/08/2015 3:06 PM    [facility] [address], [place], [place]  [address] Phone: [phone]; Fax: [phone]

## 2015-03-17 ENCOUNTER — Ambulatory Visit (HOSPITAL_COMMUNITY): Payer: Commercial Managed Care - HMO | Attending: Cardiology | Admitting: Radiology

## 2015-03-17 DIAGNOSIS — I2510 Atherosclerotic heart disease of native coronary artery without angina pectoris: Secondary | ICD-10-CM | POA: Insufficient documentation

## 2015-03-17 MED ORDER — TECHNETIUM TC 99M SESTAMIBI GENERIC - CARDIOLITE
33.0000 | Freq: Once | INTRAVENOUS | Status: AC | PRN
  Administered 2015-03-17: 33 via INTRAVENOUS

## 2015-03-17 MED ORDER — REGADENOSON 0.4 MG/5ML IV SOLN
0.4000 mg | Freq: Once | INTRAVENOUS | Status: AC
  Administered 2015-03-17: 0.4 mg via INTRAVENOUS

## 2015-03-17 MED ORDER — TECHNETIUM TC 99M SESTAMIBI GENERIC - CARDIOLITE
11.0000 | Freq: Once | INTRAVENOUS | Status: AC | PRN
  Administered 2015-03-17: 11 via INTRAVENOUS

## 2015-03-17 NOTE — Progress Notes (Signed)
[facility] SITE 3 NUCLEAR MED [address] , [place] [address] [phone]    Cardiology Nuclear Med Study  Alonna BucklerSharon L Caldwell is a 74 y.o. female     MRN : [id number]     DOB: 11/01/1941  Procedure Date: 03/17/2015  Nuclear Med Background Indication for Stress Test:  Evaluation for Ischemia History:  CAD, Asthma, COPD Cardiac Risk Factors: Hypertension  Symptoms:  Chest Pain, DOE and Light-Headedness   Nuclear Pre-Procedure Caffeine/Decaff Intake:  None NPO After: 8:00pm   Lungs:  clear O2 Sat: 98% on room air. IV 0.9% NS with Angio Cath:  22g  IV Site: R Hand  IV Started by:  Bonnita LevanJackie Smith, RN  Chest Size (in):  42 Cup Size: C  Height: 5\' 9"  (1.753 m)  Weight:  156 lb (70.761 kg)  BMI:  Body mass index is 23.03 kg/(m^2). Tech Comments:  N/A    Nuclear Med Study 1 or 2 day study: 1 day  Stress Test Type:  Lexiscan  Reading MD: N/A  Order Authorizing Provider:  Verdis PrimeHenry Smith, MD  Resting Radionuclide: Technetium 4649m Sestamibi  Resting Radionuclide Dose: 11.0 mCi   Stress Radionuclide:  Technetium 449m Sestamibi  Stress Radionuclide Dose: 33.0 mCi           Stress Protocol Rest HR: 50 Stress HR: 85  Rest BP: 124/81 Stress BP: 136/82  Exercise Time (min): n/a METS: n/a   Predicted Max HR: 147 bpm % Max HR: 57.82 bpm Rate Pressure Product: [id number]   Dose of Adenosine (mg):  n/a Dose of Lexiscan: 0.4 mg  Dose of Atropine (mg): n/a Dose of Dobutamine: n/a mcg/kg/min (at max HR)  Stress Test Technologist: Nelson ChimesSharon Goswami, BS-ES  Nuclear Technologist:  Doyne Keelonya Yount, CNMT     Rest Procedure:  Myocardial perfusion imaging was performed at rest 45 minutes following the intravenous administration of Technetium 4649m Sestamibi. Rest ECG: NSR - Normal EKG  Stress Procedure:  The patient received IV Lexiscan 0.4 mg over 15-seconds.  Technetium 3249m Sestamibi injected at 30-seconds.  Quantitative spect images were obtained after a 45 minute delay.  During the infusion of  Lexiscan the patient complained of cough, SOB, stomach pains and a headache.  These symtoms slowly began to resolve in recovery.  Stress ECG: No significant change from baseline ECG  QPS Raw Data Images:  Normal; no motion artifact; normal heart/lung ratio. Stress Images:  Normal homogeneous uptake in all areas of the myocardium. Rest Images:  Normal homogeneous uptake in all areas of the myocardium. Subtraction (SDS):  No evidence of ischemia. Transient Ischemic Dilatation (Normal <1.22):  0.92 Lung/Heart Ratio (Normal <0.45):  0.25  Quantitative Gated Spect Images QGS EDV:  104 ml QGS ESV:  42 ml  Impression Exercise Capacity:  Lexiscan with no exercise. BP Response:  Normal blood pressure response. Clinical Symptoms:  No chest pain. ECG Impression:  No significant ST segment change suggestive of ischemia. Comparison with Prior Nuclear Study: No images to compare  Overall Impression:  Normal stress nuclear study.  LV Ejection Fraction: 60%.  LV Wall Motion:  NL LV Function; NL Wall Motion  Cassell Clementhomas Onnie Hatchel  MD

## 2015-03-31 ENCOUNTER — Emergency Department (HOSPITAL_COMMUNITY): Payer: Commercial Managed Care - HMO

## 2015-03-31 ENCOUNTER — Encounter (HOSPITAL_COMMUNITY): Payer: Self-pay | Admitting: Emergency Medicine

## 2015-03-31 ENCOUNTER — Emergency Department (HOSPITAL_COMMUNITY)
Admission: EM | Admit: 2015-03-31 | Discharge: 2015-03-31 | Disposition: A | Discharge status: Home / Self Care | Payer: Commercial Managed Care - HMO | Attending: Emergency Medicine | Admitting: Emergency Medicine

## 2015-03-31 DIAGNOSIS — R079 Chest pain, unspecified: Secondary | ICD-10-CM | POA: Diagnosis not present

## 2015-03-31 DIAGNOSIS — R0789 Other chest pain: Secondary | ICD-10-CM | POA: Diagnosis not present

## 2015-03-31 DIAGNOSIS — I2510 Atherosclerotic heart disease of native coronary artery without angina pectoris: Secondary | ICD-10-CM | POA: Diagnosis not present

## 2015-03-31 DIAGNOSIS — Z7982 Long term (current) use of aspirin: Secondary | ICD-10-CM | POA: Diagnosis not present

## 2015-03-31 DIAGNOSIS — Z8669 Personal history of other diseases of the nervous system and sense organs: Secondary | ICD-10-CM | POA: Diagnosis not present

## 2015-03-31 DIAGNOSIS — J449 Chronic obstructive pulmonary disease, unspecified: Secondary | ICD-10-CM | POA: Insufficient documentation

## 2015-03-31 DIAGNOSIS — Z9889 Other specified postprocedural states: Secondary | ICD-10-CM | POA: Insufficient documentation

## 2015-03-31 DIAGNOSIS — E079 Disorder of thyroid, unspecified: Secondary | ICD-10-CM | POA: Insufficient documentation

## 2015-03-31 DIAGNOSIS — Z880 Allergy status to penicillin: Secondary | ICD-10-CM | POA: Diagnosis not present

## 2015-03-31 DIAGNOSIS — Z79899 Other long term (current) drug therapy: Secondary | ICD-10-CM | POA: Diagnosis not present

## 2015-03-31 DIAGNOSIS — I10 Essential (primary) hypertension: Secondary | ICD-10-CM | POA: Diagnosis not present

## 2015-03-31 DIAGNOSIS — R51 Headache: Secondary | ICD-10-CM | POA: Diagnosis not present

## 2015-03-31 LAB — BASIC METABOLIC PANEL WITH GFR
Anion gap: 8 (ref 5–15)
BUN: 18 mg/dL (ref 6–23)
CO2: 24 mmol/L (ref 19–32)
Calcium: 9.7 mg/dL (ref 8.4–10.5)
Chloride: 109 mmol/L (ref 96–112)
Creatinine, Ser: 0.93 mg/dL (ref 0.50–1.10)
GFR calc Af Amer: 69 mL/min — ABNORMAL LOW
GFR calc non Af Amer: 60 mL/min — ABNORMAL LOW
Glucose, Bld: 97 mg/dL (ref 70–99)
Potassium: 4 mmol/L (ref 3.5–5.1)
Sodium: 141 mmol/L (ref 135–145)

## 2015-03-31 LAB — I-STAT TROPONIN, ED
Troponin i, poc: 0 ng/mL (ref 0.00–0.08)
Troponin i, poc: 0 ng/mL (ref 0.00–0.08)

## 2015-03-31 LAB — CBG MONITORING, ED: Glucose-Capillary: 96 mg/dL (ref 70–99)

## 2015-03-31 LAB — CBC
HCT: 39.4 % (ref 36.0–46.0)
Hemoglobin: 13.1 g/dL (ref 12.0–15.0)
MCH: 30.4 pg (ref 26.0–34.0)
MCHC: 33.2 g/dL (ref 30.0–36.0)
MCV: 91.4 fL (ref 78.0–100.0)
Platelets: 231 10*3/uL (ref 150–400)
RBC: 4.31 MIL/uL (ref 3.87–5.11)
RDW: 13.5 % (ref 11.5–15.5)
WBC: 7.4 10*3/uL (ref 4.0–10.5)

## 2015-03-31 LAB — BRAIN NATRIURETIC PEPTIDE: B Natriuretic Peptide: 87.1 pg/mL (ref 0.0–100.0)

## 2015-03-31 MED ORDER — ASPIRIN 81 MG PO CHEW
324.0000 mg | CHEWABLE_TABLET | Freq: Once | ORAL | Status: AC
  Administered 2015-03-31: 324 mg via ORAL
  Filled 2015-03-31: qty 4

## 2015-03-31 NOTE — ED Provider Notes (Signed)
CSN: [id number]     Arrival date & time 03/31/15  1446 History   First MD Initiated Contact with Patient 03/31/15 1529     Chief Complaint  Patient presents with  . Chest Pain     (Consider location/radiation/quality/duration/timing/severity/associated sxs/prior Treatment) Patient is a 74 y.o. female presenting with chest pain. The history is provided by the patient. No language interpreter was used.  Chest Pain Pain location:  R chest Pain quality: pressure and shooting   Pain radiates to:  Does not radiate Pain radiates to the back: no   Pain severity:  Moderate Onset quality:  Sudden Timing:  Constant Progression:  Resolved Chronicity:  New Context: at rest   Relieved by:  Rest Worsened by:  Nothing tried Ineffective treatments:  None tried Associated symptoms: headache   Associated symptoms: no abdominal pain, no altered mental status, no anorexia, no anxiety, no back pain, no cough, no fatigue, no nausea, no palpitations, no shortness of breath, not vomiting and no weakness   Risk factors: coronary artery disease and hypertension   Risk factors: no aortic disease and no prior DVT/PE     Past Medical History  Diagnosis Date  . Thyroid disease   . COPD (chronic obstructive pulmonary disease)   . Hypertension   . Coronary artery disease   . Cataract   . Asthma    Past Surgical History  Procedure Laterality Date  . Thyroid surgery    . Abdominal hysterectomy    . Left heart catheterization with coronary angiogram N/A 10/07/2012    Procedure: LEFT HEART CATHETERIZATION WITH CORONARY ANGIOGRAM;  Surgeon: Robynn Pane, MD;  Location: [facility] CATH LAB;  Service: Cardiovascular;  Laterality: N/A;   Family History  Problem Relation Age of Onset  . Heart disease Mother   . Heart attack Mother   . Stroke Mother   . Heart attack Father   . Prostate cancer Father    History  Substance Use Topics  . Smoking status: Former Games developer  . Smokeless tobacco: Not on file  .  Alcohol Use: No   OB History    No data available     Review of Systems  Constitutional: Negative for fatigue.  Eyes: Negative for visual disturbance.  Respiratory: Negative for cough, chest tightness, shortness of breath and wheezing.   Cardiovascular: Positive for chest pain. Negative for palpitations and leg swelling.  Gastrointestinal: Negative for nausea, vomiting, abdominal pain and anorexia.  Musculoskeletal: Negative for back pain.  Neurological: Positive for headaches. Negative for tremors, facial asymmetry, speech difficulty, weakness and light-headedness.  Psychiatric/Behavioral: Negative for confusion.  All other systems reviewed and are negative.     Allergies  Coconut fatty acids; Penicillins; and Sulfa antibiotics  Home Medications   Prior to Admission medications   Medication Sig Start Date End Date Taking? Authorizing Provider  ALBUTEROL SULFATE HFA IN Inhale into the lungs as needed.    Historical Provider, MD  amLODipine (NORVASC) 5 MG tablet TAKE ONE TABLET DAILY 12/18/13   Historical Provider, MD  aspirin EC 81 MG EC tablet Take 1 tablet (81 mg total) by mouth daily. 10/09/12   Rinaldo Cloud, MD  atorvastatin (LIPITOR) 20 MG tablet Take 1 tablet (20 mg total) by mouth daily at 6 PM. 10/09/12   Rinaldo Cloud, MD  HYDROcodone-acetaminophen (NORCO/VICODIN) 5-325 MG per tablet TAKE ONE PILL AS NEEDED 12/23/13   Historical Provider, MD  Ipratropium-Albuterol (COMBIVENT IN) Inhale into the lungs as needed.    Historical Provider, MD  levothyroxine (SYNTHROID, LEVOTHROID) 50 MCG tablet Take 50 mcg by mouth daily.    Historical Provider, MD  nitroGLYCERIN (NITROSTAT) 0.4 MG SL tablet Place 1 tablet (0.4 mg total) under the tongue every 5 (five) minutes x 3 doses as needed for chest pain. 03/08/15   Lyn Records, MD  pantoprazole (PROTONIX) 40 MG tablet Take 1 tablet (40 mg total) by mouth daily at 12 noon. 10/09/12   Rinaldo Cloud, MD   BP 120/79 mmHg  Pulse 52   Temp(Src) 97.7 F (36.5 C) (Oral)  Resp 18  SpO2 100% Physical Exam  Constitutional: She appears well-developed and well-nourished. She is cooperative. She does not appear ill. No distress.  HENT:  Head: Normocephalic and atraumatic.  Nose: Nose normal.  Mouth/Throat: Oropharynx is clear and moist. No oropharyngeal exudate.  Eyes: EOM are normal. Pupils are equal, round, and reactive to light.  Neck: Normal range of motion. Neck supple.  Cardiovascular: Regular rhythm, normal heart sounds and intact distal pulses.  Bradycardia present.   No murmur heard. Pulmonary/Chest: Effort normal and breath sounds normal. No respiratory distress. She has no wheezes. She exhibits no tenderness.  No reproducible chest tenderness.  Abdominal: Soft. There is no tenderness. There is no rebound and no guarding.  Musculoskeletal: Normal range of motion. She exhibits no tenderness.  Lymphadenopathy:    She has no cervical adenopathy.  Neurological: She is alert. No cranial nerve deficit. Coordination normal.  Skin: Skin is warm and dry. She is not diaphoretic.  Psychiatric: She has a normal mood and affect. Her behavior is normal. Judgment and thought content normal.  Nursing note and vitals reviewed.   ED Course  Procedures (including critical care time) Labs Review Labs Reviewed  BASIC METABOLIC PANEL - Abnormal; Notable for the following:    GFR calc non Af Amer 60 (*)    GFR calc Af Amer 69 (*)    All other components within normal limits  CBC  BRAIN NATRIURETIC PEPTIDE  I-STAT TROPOININ, ED  CBG MONITORING, ED    Imaging Review Dg Chest 2 View  03/31/2015   CLINICAL DATA:  Chest pain.  COPD.  EXAM: CHEST  2 VIEW  COMPARISON:  None.  FINDINGS: Heart size and pulmonary vascularity are normal. The lungs are hyperinflated. No infiltrates or effusions. Tortuosity of the thoracic aorta. Slight accentuation of the thoracic kyphosis.  Surgical clips in the lower neck consistent with previous  thyroid surgery.  IMPRESSION: Emphysema.  No acute abnormality.   Electronically Signed   By: Francene Boyers M.D.   On: 03/31/2015 15:53     EKG Interpretation   Date/Time:  Wednesday March 31 2015 14:57:43 EDT Ventricular Rate:  49 PR Interval:  130 QRS Duration: 86 QT Interval:  514 QTC Calculation: 464 R Axis:   31 Text Interpretation:  Sinus bradycardia Otherwise normal ECG compared to  nonspecific ST/T abnormality no longer present Confirmed by [facility]  MD,  TREY ([id number]) on 03/31/2015 3:45:52 PM      MDM   Final diagnoses:  Chest pain, unspecified chest pain type   Pt is a 74 yo F with hx of COPD, HTN, CAD, cervical DJD, and hx of thyroid cancer s/p resection who presents with chest pain.  Was with her granddaughter in the ED when she developed sharp right lateral chest pain that radiated to sternal area.  Associated with a left sided frontal headache that occurred at the same time.  No SOB, nausea, lightheadedness.  Didn't feel like her  previous chest pain.   The pain completely resolved after a few minutes prior to being roomed in the ED.  Has a hx of CAD but had a negative stress test 2 weeks ago.    Appears well.  On room air. Bradycardia in 40-50s but otherwise normal vitals.  No reproducible chest tenderness.  No skin changes noted on right breast/chest wall.    Given ASA 325 due to risk of ACS.   EKG with sinus brady at 49, no signs of ischemia.   Will send delta trops.    CXR benign.   First set of labs were normal.   Second trop negative at [id number].   No indication that patient is having an acute emergent cause of her chest pain.  Considered stable for discharge home.  Advised that patient stay well hydrated.  NSAIDs prn for pain.  To f/u with PCP as needed.    Patient was seen with ED Attending, Dr. Loretha StaplerWofford.   Lenell AntuJamie Chanceler Pullin, MD  Lenell AntuJamie Santiago Graf, MD 04/01/15 Josefa [id number]  Blake DivineJohn Wofford, MD 04/01/15 [phone]

## 2015-03-31 NOTE — ED Notes (Signed)
Pt was in ED with family member-- with c/o chest pain that started while family member was being discharged, states "right sided near breast, radiating across midchest, with sudden sharp head pain, and ears ringing"  "tight feeling at present"

## 2015-03-31 NOTE — Discharge Instructions (Signed)

## 2015-06-22 DIAGNOSIS — E785 Hyperlipidemia, unspecified: Secondary | ICD-10-CM | POA: Diagnosis not present

## 2015-06-22 DIAGNOSIS — K219 Gastro-esophageal reflux disease without esophagitis: Secondary | ICD-10-CM | POA: Diagnosis not present

## 2015-06-22 DIAGNOSIS — J449 Chronic obstructive pulmonary disease, unspecified: Secondary | ICD-10-CM | POA: Diagnosis not present

## 2015-06-22 DIAGNOSIS — E039 Hypothyroidism, unspecified: Secondary | ICD-10-CM | POA: Diagnosis not present

## 2015-06-22 DIAGNOSIS — I10 Essential (primary) hypertension: Secondary | ICD-10-CM | POA: Diagnosis not present

## 2015-06-22 DIAGNOSIS — R739 Hyperglycemia, unspecified: Secondary | ICD-10-CM | POA: Diagnosis not present

## 2015-06-22 DIAGNOSIS — E559 Vitamin D deficiency, unspecified: Secondary | ICD-10-CM | POA: Diagnosis not present

## 2015-06-22 DIAGNOSIS — M129 Arthropathy, unspecified: Secondary | ICD-10-CM | POA: Diagnosis not present

## 2015-06-24 DIAGNOSIS — I10 Essential (primary) hypertension: Secondary | ICD-10-CM | POA: Diagnosis not present

## 2015-06-24 DIAGNOSIS — M129 Arthropathy, unspecified: Secondary | ICD-10-CM | POA: Diagnosis not present

## 2015-06-24 DIAGNOSIS — N393 Stress incontinence (female) (male): Secondary | ICD-10-CM | POA: Diagnosis not present

## 2015-06-24 DIAGNOSIS — E039 Hypothyroidism, unspecified: Secondary | ICD-10-CM | POA: Diagnosis not present

## 2015-06-24 DIAGNOSIS — Z0000 Encounter for general adult medical examination without abnormal findings: Secondary | ICD-10-CM | POA: Diagnosis not present

## 2015-06-24 DIAGNOSIS — E559 Vitamin D deficiency, unspecified: Secondary | ICD-10-CM | POA: Diagnosis not present

## 2015-08-11 ENCOUNTER — Emergency Department (HOSPITAL_COMMUNITY): Payer: Commercial Managed Care - HMO

## 2015-08-11 ENCOUNTER — Encounter (HOSPITAL_COMMUNITY): Payer: Self-pay | Admitting: Emergency Medicine

## 2015-08-11 ENCOUNTER — Emergency Department (HOSPITAL_COMMUNITY)
Admission: EM | Admit: 2015-08-11 | Discharge: 2015-08-11 | Disposition: A | Discharge status: Home / Self Care | Payer: Commercial Managed Care - HMO | Attending: Emergency Medicine | Admitting: Emergency Medicine

## 2015-08-11 DIAGNOSIS — Y929 Unspecified place or not applicable: Secondary | ICD-10-CM | POA: Diagnosis not present

## 2015-08-11 DIAGNOSIS — S8002XA Contusion of left knee, initial encounter: Secondary | ICD-10-CM | POA: Diagnosis not present

## 2015-08-11 DIAGNOSIS — S0003XA Contusion of scalp, initial encounter: Secondary | ICD-10-CM | POA: Insufficient documentation

## 2015-08-11 DIAGNOSIS — Z79899 Other long term (current) drug therapy: Secondary | ICD-10-CM | POA: Insufficient documentation

## 2015-08-11 DIAGNOSIS — S199XXA Unspecified injury of neck, initial encounter: Secondary | ICD-10-CM | POA: Diagnosis not present

## 2015-08-11 DIAGNOSIS — Z880 Allergy status to penicillin: Secondary | ICD-10-CM | POA: Diagnosis not present

## 2015-08-11 DIAGNOSIS — S6991XA Unspecified injury of right wrist, hand and finger(s), initial encounter: Secondary | ICD-10-CM | POA: Diagnosis not present

## 2015-08-11 DIAGNOSIS — J449 Chronic obstructive pulmonary disease, unspecified: Secondary | ICD-10-CM | POA: Insufficient documentation

## 2015-08-11 DIAGNOSIS — Z87891 Personal history of nicotine dependence: Secondary | ICD-10-CM | POA: Diagnosis not present

## 2015-08-11 DIAGNOSIS — M25531 Pain in right wrist: Secondary | ICD-10-CM

## 2015-08-11 DIAGNOSIS — Y9389 Activity, other specified: Secondary | ICD-10-CM | POA: Diagnosis not present

## 2015-08-11 DIAGNOSIS — Z7982 Long term (current) use of aspirin: Secondary | ICD-10-CM | POA: Insufficient documentation

## 2015-08-11 DIAGNOSIS — Z8669 Personal history of other diseases of the nervous system and sense organs: Secondary | ICD-10-CM | POA: Insufficient documentation

## 2015-08-11 DIAGNOSIS — W109XXA Fall (on) (from) unspecified stairs and steps, initial encounter: Secondary | ICD-10-CM | POA: Diagnosis not present

## 2015-08-11 DIAGNOSIS — I2510 Atherosclerotic heart disease of native coronary artery without angina pectoris: Secondary | ICD-10-CM | POA: Insufficient documentation

## 2015-08-11 DIAGNOSIS — S0990XA Unspecified injury of head, initial encounter: Secondary | ICD-10-CM | POA: Insufficient documentation

## 2015-08-11 DIAGNOSIS — Y998 Other external cause status: Secondary | ICD-10-CM | POA: Diagnosis not present

## 2015-08-11 DIAGNOSIS — I10 Essential (primary) hypertension: Secondary | ICD-10-CM | POA: Insufficient documentation

## 2015-08-11 DIAGNOSIS — E079 Disorder of thyroid, unspecified: Secondary | ICD-10-CM | POA: Diagnosis not present

## 2015-08-11 DIAGNOSIS — W19XXXA Unspecified fall, initial encounter: Secondary | ICD-10-CM

## 2015-08-11 DIAGNOSIS — J45909 Unspecified asthma, uncomplicated: Secondary | ICD-10-CM | POA: Diagnosis not present

## 2015-08-11 MED ORDER — HYDROCODONE-ACETAMINOPHEN 5-325 MG PO TABS: 1.0000 | ORAL_TABLET | Freq: Four times a day (QID) | ORAL | Status: DC | PRN

## 2015-08-11 MED ORDER — HYDROCODONE-ACETAMINOPHEN 5-325 MG PO TABS
2.0000 | ORAL_TABLET | Freq: Once | ORAL | Status: AC
  Administered 2015-08-11: 1 via ORAL
  Filled 2015-08-11: qty 2

## 2015-08-11 NOTE — Discharge Instructions (Signed)

## 2015-08-11 NOTE — ED Provider Notes (Signed)
CSN: [id number]     Arrival date & time 08/11/15  1142 History   First MD Initiated Contact with Patient 08/11/15 1145     Chief Complaint  Patient presents with  . Fall     (Consider location/radiation/quality/duration/timing/severity/associated sxs/prior Treatment) HPI Comments: Patient fell today. She has foot-drop chronically, and while walking up the steps she ahd a foot dr  Patient is a 74 y.o. female presenting with fall. The history is provided by the patient.  Fall This is a new problem. The current episode started less than 1 hour ago. Episode frequency: once. The problem has not changed since onset.Pertinent negatives include no chest pain, no abdominal pain and no shortness of breath. Nothing aggravates the symptoms. Nothing relieves the symptoms.    Past Medical History  Diagnosis Date  . Thyroid disease   . COPD (chronic obstructive pulmonary disease)   . Hypertension   . Coronary artery disease   . Cataract   . Asthma    Past Surgical History  Procedure Laterality Date  . Thyroid surgery    . Abdominal hysterectomy    . Left heart catheterization with coronary angiogram N/A 10/07/2012    Procedure: LEFT HEART CATHETERIZATION WITH CORONARY ANGIOGRAM;  Surgeon: Robynn Pane, MD;  Location: [facility] CATH LAB;  Service: Cardiovascular;  Laterality: N/A;   Family History  Problem Relation Age of Onset  . Heart disease Mother   . Heart attack Mother   . Stroke Mother   . Heart attack Father   . Prostate cancer Father    Social History  Substance Use Topics  . Smoking status: Former Games developer  . Smokeless tobacco: None  . Alcohol Use: No   OB History    No data available     Review of Systems  Constitutional: Negative for fever.  Respiratory: Negative for cough and shortness of breath.   Cardiovascular: Negative for chest pain.  Gastrointestinal: Negative for abdominal pain.  All other systems reviewed and are negative.     Allergies  Coconut fatty  acids; Penicillins; and Sulfa antibiotics  Home Medications   Prior to Admission medications   Medication Sig Start Date End Date Taking? Authorizing Provider  albuterol (PROVENTIL HFA;VENTOLIN HFA) 108 (90 BASE) MCG/ACT inhaler Inhale 1 puff into the lungs every 6 (six) hours as needed for wheezing or shortness of breath.    Historical Provider, MD  ALBUTEROL SULFATE HFA IN Inhale into the lungs as needed.    Historical Provider, MD  albuterol-ipratropium (COMBIVENT) 18-103 MCG/ACT inhaler Inhale 1 puff into the lungs every 4 (four) hours as needed for wheezing or shortness of breath.    Historical Provider, MD  amLODipine (NORVASC) 5 MG tablet TAKE ONE TABLET DAILY 12/18/13   Historical Provider, MD  aspirin EC 81 MG EC tablet Take 1 tablet (81 mg total) by mouth daily. 10/09/12   Rinaldo Cloud, MD  atorvastatin (LIPITOR) 20 MG tablet Take 1 tablet (20 mg total) by mouth daily at 6 PM. 10/09/12   Rinaldo Cloud, MD  HYDROcodone-acetaminophen (NORCO/VICODIN) 5-325 MG per tablet Take 1 tablet by mouth every 4 (four) hours as needed for moderate pain. TAKE ONE PILL AS NEEDED 12/23/13   Historical Provider, MD  levothyroxine (SYNTHROID, LEVOTHROID) 50 MCG tablet Take 50 mcg by mouth daily.    Historical Provider, MD  nitroGLYCERIN (NITROSTAT) 0.4 MG SL tablet Place 1 tablet (0.4 mg total) under the tongue every 5 (five) minutes x 3 doses as needed for chest pain. 03/08/15  Lyn Records, MD  pantoprazole (PROTONIX) 40 MG tablet Take 1 tablet (40 mg total) by mouth daily at 12 noon. 10/09/12   Rinaldo Cloud, MD   BP 125/77 mmHg  Pulse 53  Temp(Src) 98.3 F (36.8 C) (Oral)  Resp 20  SpO2 99% Physical Exam  Constitutional: She is oriented to person, place, and time. She appears well-developed and well-nourished. No distress.  HENT:  Head: Normocephalic.    Mouth/Throat: Oropharynx is clear and moist.  Eyes: EOM are normal. Pupils are equal, round, and reactive to light.  Neck: Normal range of  motion. Neck supple.  Cardiovascular: Normal rate and regular rhythm.  Exam reveals no friction rub.   No murmur heard. Pulmonary/Chest: Effort normal and breath sounds normal. No respiratory distress. She has no wheezes. She has no rales.  Abdominal: Soft. She exhibits no distension. There is no tenderness. There is no rebound.  Musculoskeletal: Normal range of motion. She exhibits no edema.       Right wrist: She exhibits bony tenderness (distal radius, snuffbox).       Cervical back: She exhibits bony tenderness (mild, mid cervical).       Right hand: She exhibits bony tenderness (1st metacarpal). Normal sensation noted. Normal strength noted.       Legs: Neurological: She is alert and oriented to person, place, and time.  Skin: No rash noted. She is not diaphoretic.  Nursing note and vitals reviewed.   ED Course  Procedures (including critical care time) Labs Review Labs Reviewed - No data to display  Imaging Review Dg Wrist Complete Right  08/11/2015   CLINICAL DATA:  Pain following fall  EXAM: RIGHT WRIST - COMPLETE 3+ VIEW  COMPARISON:  None.  FINDINGS: Frontal, oblique, lateral, and ulnar deviation scaphoid images were obtained. There is no fracture or dislocation. Joint spaces appear intact. No erosive change or intra-articular calcification.  IMPRESSION: No fracture or dislocation.  No appreciable arthropathy.   Electronically Signed   By: Bretta Bang III M.D.   On: 08/11/2015 13:24   Dg Knee 2 Views Left  08/11/2015   CLINICAL DATA:  74 year old female who fell with anterior left knee bruising. Initial encounter.  EXAM: LEFT KNEE - 1-2 VIEW  COMPARISON:  None.  FINDINGS: AP and Cross-table lateral view. No definite joint effusion. Patella appears intact. Anterior and medial soft tissue swelling. No acute fracture identified.  IMPRESSION: No acute fracture or dislocation identified about the left knee.   Electronically Signed   By: Odessa Fleming M.D.   On: 08/11/2015 13:24   Ct  Head Wo Contrast  08/11/2015   CLINICAL DATA:  74 year old female who fell face first into dry wall. Initial encounter.  EXAM: CT HEAD WITHOUT CONTRAST  CT CERVICAL SPINE WITHOUT CONTRAST  TECHNIQUE: Multidetector CT imaging of the head and cervical spine was performed following the standard protocol without intravenous contrast. Multiplanar CT image reconstructions of the cervical spine were also generated.  COMPARISON:  None.  FINDINGS: CT HEAD FINDINGS  Minor ethmoid sinus mucosal thickening. Other Visualized paranasal sinuses and mastoids are clear. Trace retained secretions in the nasopharynx. No acute orbit or scalp soft tissue finding.  Degenerative changes at the TMJ. No acute skull fracture. There is a smooth erosion of the outer table of the right occipital bone (series 3, image 25). This has a benign appearance and might be vascular related. No similar lesion elsewhere about the skull.  Calcified atherosclerosis at the skull base. Cerebral volume is within  normal limits for age. Incidental choroid plexus cysts. Patchy bilateral cerebral white matter hypodensity. No ventriculomegaly. No midline shift, mass effect, or evidence of intracranial mass lesion. No cortically based acute infarct identified. No acute intracranial hemorrhage identified. No suspicious intracranial vascular hyperdensity.  CT CERVICAL SPINE FINDINGS  Osteopenia appears more advanced in the cervical spine. No destructive osseous lesion is identified in the cervical spine or at the thoracic inlet. Tortuous vertebral arteries with occasional mild vertebral body scalloping is noted.  Visualized skull base is intact. No atlanto-occipital dissociation. Cervicothoracic junction alignment is within normal limits. Bilateral posterior element alignment is within normal limits. Chronic disc and endplate degeneration at C5-C6 and C6-C7. No acute cervical spine fracture identified. Grossly intact visualized upper thoracic levels.  Negative lung  apices. Sequelae of partial thyroidectomy. Otherwise negative noncontrast paraspinal soft tissues.  IMPRESSION: 1. No acute intracranial abnormality. Mild to moderate for age nonspecific cerebral white matter changes, most commonly due to chronic small vessel disease. 2. No acute fracture or listhesis identified in the cervical spine. Ligamentous injury is not excluded. 3. Advanced osteopenia in the cervical spine. 9 mm benign right occipital bone lesion. No destructive or suspicious osseous lesion identified.   Electronically Signed   By: Odessa Fleming M.D.   On: 08/11/2015 15:59   Ct Cervical Spine Wo Contrast  08/11/2015   CLINICAL DATA:  74 year old female who fell face first into dry wall. Initial encounter.  EXAM: CT HEAD WITHOUT CONTRAST  CT CERVICAL SPINE WITHOUT CONTRAST  TECHNIQUE: Multidetector CT imaging of the head and cervical spine was performed following the standard protocol without intravenous contrast. Multiplanar CT image reconstructions of the cervical spine were also generated.  COMPARISON:  None.  FINDINGS: CT HEAD FINDINGS  Minor ethmoid sinus mucosal thickening. Other Visualized paranasal sinuses and mastoids are clear. Trace retained secretions in the nasopharynx. No acute orbit or scalp soft tissue finding.  Degenerative changes at the TMJ. No acute skull fracture. There is a smooth erosion of the outer table of the right occipital bone (series 3, image 25). This has a benign appearance and might be vascular related. No similar lesion elsewhere about the skull.  Calcified atherosclerosis at the skull base. Cerebral volume is within normal limits for age. Incidental choroid plexus cysts. Patchy bilateral cerebral white matter hypodensity. No ventriculomegaly. No midline shift, mass effect, or evidence of intracranial mass lesion. No cortically based acute infarct identified. No acute intracranial hemorrhage identified. No suspicious intracranial vascular hyperdensity.  CT CERVICAL SPINE  FINDINGS  Osteopenia appears more advanced in the cervical spine. No destructive osseous lesion is identified in the cervical spine or at the thoracic inlet. Tortuous vertebral arteries with occasional mild vertebral body scalloping is noted.  Visualized skull base is intact. No atlanto-occipital dissociation. Cervicothoracic junction alignment is within normal limits. Bilateral posterior element alignment is within normal limits. Chronic disc and endplate degeneration at C5-C6 and C6-C7. No acute cervical spine fracture identified. Grossly intact visualized upper thoracic levels.  Negative lung apices. Sequelae of partial thyroidectomy. Otherwise negative noncontrast paraspinal soft tissues.  IMPRESSION: 1. No acute intracranial abnormality. Mild to moderate for age nonspecific cerebral white matter changes, most commonly due to chronic small vessel disease. 2. No acute fracture or listhesis identified in the cervical spine. Ligamentous injury is not excluded. 3. Advanced osteopenia in the cervical spine. 9 mm benign right occipital bone lesion. No destructive or suspicious osseous lesion identified.   Electronically Signed   By: Althea Grimmer.D.  On: 08/11/2015 15:59   I have personally reviewed and evaluated these images and lab results as part of my medical decision-making.   EKG Interpretation None      MDM   Final diagnoses:  Fall, initial encounter  Right wrist pain    52F here after a fall. Mechanical. Hit her head on the wall and hurt her R wrist and L knee on the fall. No LOC, however was dazed. Denies any N/V. L parietal hematoma. R wrist tenderness along the distal radius and in the snuffbox. L knee hematoma. Will obtain imaging and give vicodin.  CTs negative. Xrays negative. Given velcro thumb spica for possible scaphoid fracture.  Elwin Mocha, MD 08/11/15 [phone]

## 2015-08-11 NOTE — ED Notes (Signed)
Pt requested to only receive one NORCO at this time.

## 2015-08-11 NOTE — ED Notes (Signed)
Larey Seat today after tripping.  Fell face first into drywall.    States she saw starts but no loc.  C/o of neck pain but no more than usual as she has chronic neck pain.  Also clo right elbow pain and right knee pain.

## 2015-11-29 DIAGNOSIS — M5090 Cervical disc disorder, unspecified, unspecified cervical region: Secondary | ICD-10-CM | POA: Diagnosis not present

## 2015-11-29 DIAGNOSIS — Z9119 Patient's noncompliance with other medical treatment and regimen: Secondary | ICD-10-CM | POA: Diagnosis not present

## 2015-11-29 DIAGNOSIS — J441 Chronic obstructive pulmonary disease with (acute) exacerbation: Secondary | ICD-10-CM | POA: Diagnosis not present

## 2015-11-29 DIAGNOSIS — I25119 Atherosclerotic heart disease of native coronary artery with unspecified angina pectoris: Secondary | ICD-10-CM | POA: Diagnosis not present

## 2015-11-29 DIAGNOSIS — F322 Major depressive disorder, single episode, severe without psychotic features: Secondary | ICD-10-CM | POA: Diagnosis not present

## 2015-11-29 DIAGNOSIS — M129 Arthropathy, unspecified: Secondary | ICD-10-CM | POA: Diagnosis not present

## 2015-11-29 DIAGNOSIS — I672 Cerebral atherosclerosis: Secondary | ICD-10-CM | POA: Diagnosis not present

## 2015-11-29 DIAGNOSIS — M5410 Radiculopathy, site unspecified: Secondary | ICD-10-CM | POA: Diagnosis not present

## 2015-12-27 ENCOUNTER — Other Ambulatory Visit: Payer: Self-pay

## 2015-12-27 DIAGNOSIS — Z79899 Other long term (current) drug therapy: Secondary | ICD-10-CM | POA: Diagnosis not present

## 2015-12-27 DIAGNOSIS — M5410 Radiculopathy, site unspecified: Secondary | ICD-10-CM | POA: Diagnosis not present

## 2015-12-27 DIAGNOSIS — I10 Essential (primary) hypertension: Secondary | ICD-10-CM | POA: Diagnosis not present

## 2015-12-27 DIAGNOSIS — I25119 Atherosclerotic heart disease of native coronary artery with unspecified angina pectoris: Secondary | ICD-10-CM | POA: Diagnosis not present

## 2015-12-27 DIAGNOSIS — E039 Hypothyroidism, unspecified: Secondary | ICD-10-CM | POA: Diagnosis not present

## 2015-12-27 DIAGNOSIS — E559 Vitamin D deficiency, unspecified: Secondary | ICD-10-CM | POA: Diagnosis not present

## 2015-12-27 DIAGNOSIS — J449 Chronic obstructive pulmonary disease, unspecified: Secondary | ICD-10-CM | POA: Diagnosis not present

## 2015-12-27 DIAGNOSIS — J441 Chronic obstructive pulmonary disease with (acute) exacerbation: Secondary | ICD-10-CM

## 2015-12-27 NOTE — Patient Outreach (Addendum)
[facility]) Care Management  12/27/2015  Phyllis Caldwell 10/01/1941 [id number]    Referral Date: 12/22/15 Referral Source: MD office( Dr. Paulino RilyWolters) Referral Reason: "patient needs SW for med assistance, depression counseling"   Incoming call from patient returning RN CM's call. Screening completed.  Social: Patient resides in an apartment along with five other relatives. She states she is the sole provider for everyone in the home and they all live off her income. Patient's dtr boyfriend and several of her kids reside in the home. Dtr does not work but is physically able to do so. She is widowed as spouse died last year. Patient states she uses her grandson's car to get back and forth to MD appts. She reports that her apartment rent is going up and she is concerned about being able to "make ends meet" as she is barely getting by now. She did sustain a fall in Aug 2016. No other falls since then. She has a cane in the home. Patient does voice issues with ongoing depression whish has worsened due to her current financial and family/social status and issues. She denies any suicidal ideation. She states she has thought about seeing psych MD but does not want to take any anti-depressants. She reports being on meds several years ago and had bad side effects from med.  Conditions: Patient has h/o COPD(current smoker), depression, arthopathy, cervical disc disease, foot drop and radiculopathy of leg.   Medications: She reports she is supposed to be taking 10 meds. She reports she stopped taking ALL of her meds in Sept 2016 due to inability to afford meds. MD is aware of this. Asked patient if it was due to being in doughnut hole but she reported she was unsure. She has not attempted to get any meds filled since the new year has arrived.  Patient states that her respiratory meds were the most expensive.   Appointments: Patient saw PCP on 11/29/15 and was back at MD office today for labs.  She reports MD wanted to draw blood work to further assess her condition since she has been off her meds for so long.  Consent: [facility]. Patient states she is unable to retrieve ANY voicemail messages on her phone and asks that staff attempt multiple times to reach her and don't leave a message as she will not be able to hear it.  Plan: RN CM will notify [facility] administrative assistant of case status. RN CM provided [facility] with [facility]. RN CM will [facility] referral for further financial assessment, depression counseling and community resources. RN CM [facility] for polypharmacy med review and possible med assistance. RN CM will [facility] community program for further in home eval and assessment.  Antionette Fairyoshanda Avah Bashor, RN,BSN,CCM [facility] Care Management Telephonic Care Management Coordinator Direct Phone: [phone] Toll Free: [phone] Fax: [phone]

## 2015-12-27 NOTE — Patient Outreach (Signed)
[facility]) Care Management  12/27/2015  Phyllis BucklerSharon L Caldwell 06/10/1941 [id number]   Telephone Screen  Referral Date: 12/22/15 Referral Source: MD office( Dr. Paulino RilyWolters) Referral Reason: "patient needs SW for med assistance, depression counseling"   Outreach attempt # 1 to patient. No answer. RN CM left HIPAA compliant voicemail message along with contact info.    Plan: RN CM will attempt outreach call to patient within a week.  Phyllis Fairyoshanda Adianna Darwin, RN,BSN,CCM [facility] Care [facility] Telephonic Care Management Coordinator Direct Phone: [phone] Toll Free: [phone] Fax: [phone]

## 2015-12-31 ENCOUNTER — Other Ambulatory Visit: Payer: Self-pay | Admitting: Licensed Clinical Social Worker

## 2015-12-31 ENCOUNTER — Encounter: Payer: Self-pay | Admitting: Licensed Clinical Social Worker

## 2015-12-31 NOTE — Patient Outreach (Signed)
[facility]) Care Management  12/31/2015  Phyllis Caldwell 1941-07-17 [id number]   Assessment-CSW received referral from PCP for financial assistant and depression management. CSW completed initial outreach on 12/31/15 and was able to reach patient successfully. CSW introduced self, reason for call of [facility] social work services. Patient agreeable to social work assistance. Patient reports that she has depressive symptoms on a daily basis due to "a lot of drama at my house." Patient reports that she has five other family members that stay at her apartment with her and they all live off her of her income which has led to financial stressors. Patient receives $2,000 in social security. Patient is not eligible for Medicaid. Patient states "I know I make too much to get any food stamps or any type of assistance." Patient denied needing food pantry list. Patient states that her daughter receives food stamps. CSW referred patient to Credit Consumer Counseling but patient declined stating "I don't think it would help." Patient is agreeable to CSW mailing out community resource guide for Seniors and financial resources for [employer]. Patient reports that her apartment rent will go up this year and she is unsure how she will be able to afford it. Current rent is $775 per month. Patient shares that her husband passed away a few years ago and that "I made a promise to him that I would take care of my family."   Patient reports that she has suffered from depression for years. Patient shares that she has positive activities that she can implement into her daily schedule in order to alleviate depressive systems. Patient shares that her family has 2 dogs that she provides care to and that she walks them when she can. Patient states that she reads her bible every morning. Patient reports that she attends [employer] and gets books and buys crossword puzzles. Patient reports that she has received  counseling before but that it was a bad experience. She shared that she has no desire to take anti depressants due to side effects. Patient is agreeable to [facility] referral. Patient is agreeable to CSW mailing out mental health resources. Patient reports that she has heard of [facility] services. Patient shares "I am not sure if I am willing to do counseling but it may be worth a shot. I just don't get out of the house often and I have no transportation."   Patient shares that her daughter drives her grandson's car to transport her to medical appointments. CSW explained that she is able to use [employer] and has six round trips per year. CSW will mail patient directions on how to arrange transportation with [facility]. CSW educated patient on [employer] and [employer] Patient is hesitant to allow CSW to complete application to agencies but shares that she will consider this.  CSW and RNCM will complete home visit on 01/05/16 at 10:30 am.  Plan-CSW will send message in the form of in basket to [facility] Care Management Assistant to request that Directions on how to arrange transportation with [facility], Financial resources, Mental health resources and [employer] for Seniors be mailed to her residence. Home visit scheduled. CSW will send involvement letter to PCP. CSW will route encounter to PCP.  [name], BSW, MSW, LCSW [employer] [email or web address] Phone: [phone] Fax: [phone]

## 2015-12-31 NOTE — Patient Outreach (Signed)
[facility]) Care Management  12/31/2015  Phyllis Caldwell June 28, 1941 [id number]   Requested received from Phyllis La, LCSW to mail patient information on [employer] transportation, financial assistance resources, mental health resources, and community resources for seniors. Information mailed today, 12/31/15.   Phyllis Caldwell  [facility] Care Management Assistant

## 2016-01-05 ENCOUNTER — Other Ambulatory Visit: Payer: Self-pay | Admitting: Licensed Clinical Social Worker

## 2016-01-05 ENCOUNTER — Other Ambulatory Visit: Payer: Self-pay

## 2016-01-05 NOTE — Patient Outreach (Signed)
[facility]) Care Management  [facility] Social Work  01/05/2016  BELENDA ALVIAR [birth year] [id number]  Current Medications:  Current Outpatient Prescriptions  Medication Sig Dispense Refill  . albuterol (PROVENTIL HFA;VENTOLIN HFA) 108 (90 BASE) MCG/ACT inhaler Inhale 1 puff into the lungs every 6 (six) hours as needed for wheezing or shortness of breath.    . ALBUTEROL SULFATE HFA IN Inhale into the lungs as needed.    Marland Kitchen albuterol-ipratropium (COMBIVENT) 18-103 MCG/ACT inhaler Inhale 1 puff into the lungs every 4 (four) hours as needed for wheezing or shortness of breath.    Marland Kitchen amLODipine (NORVASC) 5 MG tablet Reported on 01/05/2016    . aspirin EC 81 MG EC tablet Take 1 tablet (81 mg total) by mouth daily. (Patient not taking: Reported on 01/05/2016) 30 tablet 3  . atorvastatin (LIPITOR) 20 MG tablet Take 1 tablet (20 mg total) by mouth daily at 6 PM. (Patient not taking: Reported on 01/05/2016) 30 tablet 3  . HYDROcodone-acetaminophen (NORCO/VICODIN) 5-325 MG per tablet Take 0.5-1 tablets by mouth every 4 (four) hours as needed for moderate pain. TAKE ONE PILL AS NEEDED    . HYDROcodone-acetaminophen (NORCO/VICODIN) 5-325 MG per tablet Take 1 tablet by mouth every 6 (six) hours as needed for moderate pain. 20 tablet 0  . levothyroxine (SYNTHROID, LEVOTHROID) 50 MCG tablet Take 50 mcg by mouth daily. Reported on 01/05/2016    . nitroGLYCERIN (NITROSTAT) 0.4 MG SL tablet Place 1 tablet (0.4 mg total) under the tongue every 5 (five) minutes x 3 doses as needed for chest pain. 25 tablet 4  . pantoprazole (PROTONIX) 40 MG tablet Take 1 tablet (40 mg total) by mouth daily at 12 noon. (Patient not taking: Reported on 01/05/2016) 30 tablet 3  . Vitamin D, Ergocalciferol, (DRISDOL) 50000 UNITS CAPS capsule Take 50,000 Units by mouth once a week. Reported on 01/05/2016     No current facility-administered medications for this visit.    Functional Status:  In your present state of health,  do you have any difficulty performing the following activities: 01/05/2016  Hearing? N  Vision? Y  Difficulty concentrating or making decisions? Y  Walking or climbing stairs? Y  Dressing or bathing? Y  Doing errands, shopping? Y    Fall/Depression Screening:  PHQ 2/9 Scores 12/27/2015  PHQ - 2 Score 6  PHQ- 9 Score 13    Assessment: CSW completed joint home visit with RNCM Kathyrn Sheriff. Patient reports that she continues to have depressive symptoms. Patient has not heard from [facility] program. Referral made on 12/31/15. CSW attempted outreach to [employer] and left a HIPPA compliant voice message. Patient has not received financial resources or mental health resources in the mail yet that CSW sent. CSW sent time reviewing information. Patient expressed interest in [place] services. Patient reports that her granddaughter is suppose to go there as well. Patient does not wish to pay a co pay for a therapist but will consider receiving mental health services free of charge. [facility] caseworker can provide more information on co pays if needed. Patient states "I never get out of the house. I never really get to go out." CSW educated patient on the [facility] which can help patient gain appropriate socialization. Patient does not wish to go to [facility]. Patient does not wish to take antidepressants. Patient open to discussing depressive symptoms further with health care providers. Patient reports that she lost her husband in the year of 2012.  Patient denies needing grief support stating "I never did cry when he passed. It's apart of life. We were able to plan for it with the help of Hospice." Patient denies any suicidal or homicidal ideations.   Patient denies needing Senior Wheels or SCAT at this time. Patient has stable transportation. Patient has been educated on available [employer] benefits (6 round trips per year.) CSW reviewed how to  arrange transportation with [facility] and has mailed directions to her residence as well.   Patient lives in an apartment with her daughter and her significant other, two grandsons and grand daughter. Patient reports that the daughter's boyfriend and her grandson are the only ones that work. Patient reports her daughter is unable to work due to her own health conditions. Patient is the sole financial provider of all relatives. Patient pays rent, bills and some of the relative's cell phone bills. Patient cooks for the entire family. CSW and RNCM had a long conversation in regards to patient's current financial stressors and not receiving any assistance from other relatives who live within the home. Patient not willing to make any set changes to current living situation. Patient denies wanting to go to Credit Consume Counseling services. CSW educated patient on financial resources. However, patient may not be eligible for some of the crisis programs within [facility] due to having a monthly income of $2,200. Patient expresses understanding. Patient reports having issues with affording medications. Patient is aware that [facility] Pharmacist will be in touch to provide assistance.   Patient educated on Merchant navy officer. Patient states that she is unsure if she wants to complete document. CSW reviewed both Part A and Part B of Advance Directive with patient. Patient wishes to hold on to document in order to review. Patient is aware that she can contact CSW if she changes her mind and wants assistance.    [facility] CM Care Plan Problem One        Most Recent Value   Care Plan Problem One  Ongoing psychosocial barriers   Role Documenting the Problem One  Clinical Social Worker   Care Plan for Problem One  Active   [facility] Long Term Goal (31-90 days)  Patient will gain mental health services and resources within 90 days   [facility] Long Term Goal Start Date  01/05/16   Interventions for Problem One Long Term Goal  CSW will make  referral to [facility] referral. CSW will provide additional mental health resources and review as needed. CSW will coordinate care with [facility] caseworker as needed.   THN CM Short Term Goal #1 (0-30 days)  [place] will gain financial resources within 30 days.   [facility] CM Short Term Goal #1 Start Date  01/05/16   Interventions for Short Term Goal #1  CSW will provide a list of [facility] financial resources to patient and educate and review as needed. CSW will provide education on the Credit Consumer Counseling program.       Plan: CSW will complete outreach within two weeks. CSW will await to hear back from [name] in regards to [facility] referral. CSW will review community resources as needed with patient.  [name], BSW, MSW, LCSW [employer] [email or web address] Phone: [phone] Fax: [phone]

## 2016-01-06 NOTE — Patient Outreach (Signed)
[facility]) Care Management  [facility] Care Manager  01/06/2016   Phyllis Caldwell 27-Oct-1941 [id number]  Subjective: member expressing feelings related to financial strains as a result of extended family living with her, who do not provide support.  Objective: BP 140/100 mmHg  Pulse 57  Resp 20  Ht 1.727 m ( )  Wt 147 lb (66.679 kg)  BMI 22.36 kg/m2  SpO2 96%, lungs clear, respirations even unlabored, heart rate regular.   Current Medications:  Current Outpatient Prescriptions  Medication Sig Dispense Refill  . albuterol (PROVENTIL HFA;VENTOLIN HFA) 108 (90 BASE) MCG/ACT inhaler Inhale 1 puff into the lungs every 6 (six) hours as needed for wheezing or shortness of breath.    . ALBUTEROL SULFATE HFA IN Inhale into the lungs as needed.    Marland Kitchen albuterol-ipratropium (COMBIVENT) 18-103 MCG/ACT inhaler Inhale 1 puff into the lungs every 4 (four) hours as needed for wheezing or shortness of breath.    Marland Kitchen HYDROcodone-acetaminophen (NORCO/VICODIN) 5-325 MG per tablet Take 0.5-1 tablets by mouth every 4 (four) hours as needed for moderate pain. TAKE ONE PILL AS NEEDED    . HYDROcodone-acetaminophen (NORCO/VICODIN) 5-325 MG per tablet Take 1 tablet by mouth every 6 (six) hours as needed for moderate pain. 20 tablet 0  . nitroGLYCERIN (NITROSTAT) 0.4 MG SL tablet Place 1 tablet (0.4 mg total) under the tongue every 5 (five) minutes x 3 doses as needed for chest pain. 25 tablet 4  . amLODipine (NORVASC) 5 MG tablet Reported on 01/05/2016    . aspirin EC 81 MG EC tablet Take 1 tablet (81 mg total) by mouth daily. (Patient not taking: Reported on 01/05/2016) 30 tablet 3  . atorvastatin (LIPITOR) 20 MG tablet Take 1 tablet (20 mg total) by mouth daily at 6 PM. (Patient not taking: Reported on 01/05/2016) 30 tablet 3  . levothyroxine (SYNTHROID, LEVOTHROID) 50 MCG tablet Take 50 mcg by mouth daily. Reported on 01/05/2016    . pantoprazole (PROTONIX) 40 MG tablet Take 1 tablet (40 mg total)  by mouth daily at 12 noon. (Patient not taking: Reported on 01/05/2016) 30 tablet 3  . Vitamin D, Ergocalciferol, (DRISDOL) 50000 UNITS CAPS capsule Take 50,000 Units by mouth once a week. Reported on 01/05/2016     No current facility-administered medications for this visit.    Functional Status:  In your present state of health, do you have any difficulty performing the following activities: 01/05/2016  Hearing? N  Vision? Y  Difficulty concentrating or making decisions? Y  Walking or climbing stairs? Y  Dressing or bathing? Y  Doing errands, shopping? Y  Preparing Food and eating ? Y  Using the Toilet? Y  In the past six months, have you accidently leaked urine? Y  Do you have problems with loss of bowel control? N  Managing your Medications? Y  Managing your Finances? N  Housekeeping or managing your Housekeeping? N    Fall/Depression Screening: PHQ 2/9 Scores 01/05/2016 12/27/2015  PHQ - 2 Score 6 6  PHQ- 9 Score 13 13   Fall Risk  01/05/2016  Falls in the past year? Yes  Number falls in past yr: 1  Injury with Fall? Yes  Risk Factor Category  High Fall Risk  Risk for fall due to : Impaired mobility  Follow up Falls prevention discussed     Assessment: 75 year old referral from primary care office for social work. RNCM completed co-visit with [facility] Social work, Museum/gallery exhibitions officer, [place]. Member expressed primary goal  is to seek community resources-See social work note.   RNCM discussed disease processes(hypertension, COPD). Member declines disease management-health coach stating, her primary interest is related to social issues and community resource needs. RNCM provided [facility] calendar/organizer and instructed on COPD action plan. RNCM and LCSW also discussed smoking cessation-member declines. Discussed the importance of getting and taking medications as prescribed.  Member reports difficulty with affording medications. She is supporting 11 people in her home. Pharmacy consult previously  made.   RNCM will not be involved in case, Social work remains involved for case managment and pharmacy referred for medication issues. Social Work and Pharmacy to [employer] as needed.  Plan: update primary care.  Kathyrn Sheriff, RN, MSN, [facility] Community Care Coordinator Cell: [phone]

## 2016-01-07 ENCOUNTER — Other Ambulatory Visit: Payer: Self-pay

## 2016-01-07 NOTE — Patient Outreach (Signed)
[facility]) Care Management  01/07/2016  PIHU BASIL 1941-05-15 [id number]  Care Coordination-RNCM discussed case with [facility] Pharmacist, Steve Rattler. RNCM called member's pharmacy to request refill. RNCM spoke with Lurena Joiner: She is able to refill amlodipine and levothyroxine and reports she is sending a request to refill other medications.  Plan: update [facility] pharmacist.   Kathyrn Sheriff, RN, MSN, [facility] Community Care Coordinator Cell: [phone]

## 2016-01-10 ENCOUNTER — Other Ambulatory Visit: Payer: Self-pay | Admitting: Licensed Clinical Social Worker

## 2016-01-10 ENCOUNTER — Other Ambulatory Visit: Payer: Self-pay

## 2016-01-10 NOTE — Patient Outreach (Signed)
[facility]) Care Management  01/10/2016  Phyllis Caldwell July 10, 1941 [id number]   Assessment-CSW completed outreach to patient on 01/10/16 after receiving return call from [facility]. CSW was informed by Phyllis Caldwell who stated that one outreach has been completed and that someone that lives in residence answered and stated that patient was unavailable to speak at that time.  [facility] will make two additional outreaches and they are aware that patient can not check voice messages. CSW was able to succesfully reach patient. CSW provided updates to patient. Patient reports "well there has been a lot going on. I will keep a look out for their call."   Plan-CSW will continue to provide social work assistance. CSW will assist in patient gaining appropriate behavioral health services. CSW will make next outreach on 01/17/16.  Phyllis Caldwell, BSW, MSW, LCSW [employer] [email or web address] Phone: [phone] Fax: [phone]

## 2016-01-10 NOTE — Patient Outreach (Addendum)
01/10/16  Subjective:  Ms. Ruppel is a 75 year old female who was referred to pharmacy for medication assistance and review.  Kathyrn Sheriff, RN, her nurse care manager referred her to pharmacy for assistance due to Ms. Mato not having her medications for 4 months.  Ms. Schlottman stated she is not able to afford her medications because she takes care of the rest of her family.  Due to her situation, she qualifies for the Pharmacy Emergency Fund to be used to purchase her medications. I also asked if she would like to apply for assistance through Extra Help and she stated "Yes but I do not think I will qualify".  Ms. Bachicha stated she does not know if she is supposed to be on levothyroxine or not.  She stated the nurse said she may not need to be on it.    Objective:  Outpatient Encounter Prescriptions as of 01/10/2016  Medication Sig Note  . amLODipine (NORVASC) 5 MG tablet Reported on 01/05/2016   . atorvastatin (LIPITOR) 20 MG tablet Take 1 tablet (20 mg total) by mouth daily at 6 PM.   . furosemide (LASIX) 20 MG tablet Take 20 mg by mouth. Take one tablet daily as needed for swelling   . levothyroxine (SYNTHROID, LEVOTHROID) 50 MCG tablet Take 50 mcg by mouth daily. Reported on 01/05/2016   . nitroGLYCERIN (NITROSTAT) 0.4 MG SL tablet Place 1 tablet (0.4 mg total) under the tongue every 5 (five) minutes x 3 doses as needed for chest pain.   . pantoprazole (PROTONIX) 40 MG tablet Take 1 tablet (40 mg total) by mouth daily at 12 noon.   . Vitamin D, Ergocalciferol, (DRISDOL) 50000 UNITS CAPS capsule Take 50,000 Units by mouth once a week. Reported on 01/05/2016   . [DISCONTINUED] albuterol (PROVENTIL HFA;VENTOLIN HFA) 108 (90 BASE) MCG/ACT inhaler Inhale 1 puff into the lungs every 6 (six) hours as needed for wheezing or shortness of breath.   . [DISCONTINUED] ALBUTEROL SULFATE HFA IN Inhale into the lungs as needed. 01/05/2016: duplicate  . [DISCONTINUED] albuterol-ipratropium (COMBIVENT) 18-103 MCG/ACT  inhaler Inhale 1 puff into the lungs every 4 (four) hours as needed for wheezing or shortness of breath.   . [DISCONTINUED] aspirin EC 81 MG EC tablet Take 1 tablet (81 mg total) by mouth daily. (Patient not taking: Reported on 01/05/2016)   . [DISCONTINUED] HYDROcodone-acetaminophen (NORCO/VICODIN) 5-325 MG per tablet Take 0.5-1 tablets by mouth every 4 (four) hours as needed for moderate pain. TAKE ONE PILL AS NEEDED   . [DISCONTINUED] HYDROcodone-acetaminophen (NORCO/VICODIN) 5-325 MG per tablet Take 1 tablet by mouth every 6 (six) hours as needed for moderate pain.    No facility-administered encounter medications on file as of 01/10/2016.   Assessment:  Drugs sorted by system:  Neurologic/Psychologic: none  Cardiovascular: amlodipine, atorvastatin, nitrostat, furosemide   Pulmonary/Allergy: none  Gastrointestinal: pantoprazole  Endocrine: levothyroxine  Renal: none  Topical: none  Pain: none  Vitamins/Minerals: vitamin D  Infectious Diseases: none   Miscellaneous: none  Duplications in therapy: none Gaps in therapy: Currently not on medications for COPD Medications to avoid in the elderly: pantoprazole (increase risk for fractures and C. Diff) Drug interactions: none Other issues noted: none  Plan:  1.  I was able to purchase Ms. Larmon medications for her using the Pharmacy Emergency Funds and delivery them to her.   2.  I will need to clarify with Dr. Paulino Rily if she is supposed to be on levothyroxine.  3.  I will follow  up in 3 weeks to determine if she was approved for Extra Help via a phone call.  If not, I will help her set up mail order.   4.  I will let Donn Pierini know of my findings.    [facility] CM Care Plan Problem Two        Most Recent Value   Care Plan Problem Two  medicaton adherence   Role Documenting the Problem Two  Clinical Pharmacist   Care Plan for Problem Two  Active   [facility] CM Short Term Goal #1 (0-30 days)  Ms. Procter will take her medications 80% of the  time evident by patient report and review of medication by the pharmacist over the next 30 days    [facility] CM Short Term Goal #1 Start Date  01/10/16   Interventions for Short Term Goal #2   I purchased Ms. Hagstrom medications for her and reviewed her medications with her.  I stressed the importance of taking her medications and being adhernece.  I helped her apply her Extra Help to see if she could get her medications cheaper.  If not,she will need to look into mail order.      Steve Rattler, PharmD, [employer] [employer] consultant [phone]

## 2016-01-11 ENCOUNTER — Other Ambulatory Visit: Payer: Self-pay | Admitting: Licensed Clinical Social Worker

## 2016-01-11 ENCOUNTER — Other Ambulatory Visit: Payer: Self-pay

## 2016-01-11 NOTE — Patient Outreach (Signed)
[facility]) Care Management  01/11/2016  Phyllis Caldwell [birth year] [id number]   Assessment-CSW received call from [facility] caseworker Phyllis Caldwell. Phyllis Caldwell has been in contact with patient on both 01/10/16 and 01/11/16. Phyllis Caldwell informed CSW that patient will not be able to afford a co pay (45.00 dollars) for counseling as she is having difficulty affording her medications and financial struggles. CSW informed her that [facility] Pharmacist is involved. CSW has provided her with other mental health resources such as Phyllis Caldwell that she can use in order to gain counseling services.  Phyllis Caldwell can serve patients with any insurance provider. Phyllis Caldwell has a sliding scale fee for services that is based off patient's income and the number of people in the household. Caseworker informed CSW that patient can have five of her medications for free if she were to register with [facility] pharmacy. Caseworker provided patient with information to get started. Patient needs to contact [facility] pharmacy to register. Once she registers then they would reach out to her physician in order to gain prescriptions. The five  prescriptions come in 90 day supplies and can be mailed directly to her home. There contact number is  [phone]. The five medications are  furosemide, levothyroxine, amlodipine, atorvastatin, pantoprazole.   Plan-CSW has updated both RNCM and Pharmacist on medication assistance with [facility]. CSW will make next outreach on 01/17/16 to further assist patient with financial and mental health needs.  [name], BSW, MSW, LCSW [employer] [email or web address] Phone: [phone] Fax: [phone]

## 2016-01-11 NOTE — Patient Outreach (Signed)
I stopped by Ms. Sobol apartment to drop off the Vitamin D that was not ready yesterday when I dropped off the other medications.  I explained how she was supposed to take it.  She stated she understood.  She stated [facility] called and her medications would qualify for the zero copay if she did not qualify for Extra Help. I will follow up with her in 3 weeks.    Steve Rattler, PharmD, [employer] Environmental consultant [phone]

## 2016-01-13 ENCOUNTER — Other Ambulatory Visit: Payer: Self-pay | Admitting: Licensed Clinical Social Worker

## 2016-01-13 NOTE — Patient Outreach (Signed)
[facility]) Care Management  01/13/2016  CARALINA Caldwell March 13, 1941 [id number]   Assessment-CSW completed outreach to patient on 2/217. Patient answered. Patient reports that she has not had the chance to contact [facility] because "I'm having a lot of family problems right now." Patient would not go further into detail but stated "They have a lot going on." CSW encouraged patient to contact [facility] when she is given the chance in order to gain 90 day supplies for five medications for no cost. Patient agreed to do so. CSW provided contact information and directions. CSW encouraged patient to receive mental health services at [facility]. Patient reports that she will consider this.  Plan-CSW will complete next outreach within two weeks. CSW will update RNCM and Pharmacy as needed.   [name], BSW, MSW, LCSW [employer] [email or web address] Phone: [phone] Fax: [phone]

## 2016-01-25 ENCOUNTER — Other Ambulatory Visit: Payer: Self-pay | Admitting: Licensed Clinical Social Worker

## 2016-01-25 NOTE — Patient Outreach (Signed)
[facility]) Care Management  01/25/2016  Phyllis Caldwell 27-Mar-1941 [id number]   Assessment- CSW completed outreach to patient on 01/25/16. Patient answered. Patient states "It has been pretty hectic around here." During last outreach patient would not go into detail regarding new stressors. Patient stated that her grand daughter was incarcerated in [place] for a DWI and has now returned home. Patient states that this has caused friction between her daughter and grand daughter. Patient reports that her grand daughter has been having to do several things that are court ordered and that patient has been assisting with this. CSW educated patient on self care tools to implement. Patient reports that physically she has been feeling better these last few weeks but over the weekend had experienced "extreme pain in my shoulder, neck and back." Patient reports that she is feeling better now. CSW questioned if she had contacted [facility]. She states that they actually reached out to her but that she needed to contact them back but has not had the chance. CSW spent time reviewing available mental health resources within [facility] again.  Plan-CSW will make next outreach within two weeks to provide further social work assistance and support.  [name], BSW, MSW, LCSW [employer] [email or web address] Phone: [phone] Fax: [phone]

## 2016-01-30 ENCOUNTER — Encounter (HOSPITAL_COMMUNITY): Payer: Self-pay | Admitting: Emergency Medicine

## 2016-01-30 ENCOUNTER — Emergency Department (HOSPITAL_COMMUNITY): Payer: Commercial Managed Care - HMO

## 2016-01-30 ENCOUNTER — Emergency Department (HOSPITAL_COMMUNITY)
Admission: EM | Admit: 2016-01-30 | Discharge: 2016-01-30 | Disposition: A | Discharge status: Home / Self Care | Payer: Commercial Managed Care - HMO | Attending: Emergency Medicine | Admitting: Emergency Medicine

## 2016-01-30 DIAGNOSIS — R079 Chest pain, unspecified: Secondary | ICD-10-CM | POA: Diagnosis not present

## 2016-01-30 DIAGNOSIS — H532 Diplopia: Secondary | ICD-10-CM | POA: Insufficient documentation

## 2016-01-30 DIAGNOSIS — I2510 Atherosclerotic heart disease of native coronary artery without angina pectoris: Secondary | ICD-10-CM | POA: Insufficient documentation

## 2016-01-30 DIAGNOSIS — M25511 Pain in right shoulder: Secondary | ICD-10-CM | POA: Diagnosis not present

## 2016-01-30 DIAGNOSIS — I10 Essential (primary) hypertension: Secondary | ICD-10-CM | POA: Diagnosis not present

## 2016-01-30 DIAGNOSIS — F17200 Nicotine dependence, unspecified, uncomplicated: Secondary | ICD-10-CM | POA: Diagnosis not present

## 2016-01-30 DIAGNOSIS — J449 Chronic obstructive pulmonary disease, unspecified: Secondary | ICD-10-CM | POA: Diagnosis not present

## 2016-01-30 DIAGNOSIS — R51 Headache: Secondary | ICD-10-CM | POA: Insufficient documentation

## 2016-01-30 LAB — BASIC METABOLIC PANEL
Anion gap: 7 (ref 5–15)
BUN: 23 mg/dL — ABNORMAL HIGH (ref 6–20)
CO2: 26 mmol/L (ref 22–32)
Calcium: 10 mg/dL (ref 8.9–10.3)
Chloride: 109 mmol/L (ref 101–111)
Creatinine, Ser: 0.92 mg/dL (ref 0.44–1.00)
GFR calc Af Amer: >60 mL/min (ref >60)
GFR calc non Af Amer: 60 mL/min — ABNORMAL LOW (ref >60)
Glucose, Bld: 99 mg/dL (ref 65–99)
Potassium: 4.1 mmol/L (ref 3.5–5.1)
Sodium: 142 mmol/L (ref 135–145)

## 2016-01-30 LAB — CBC
HCT: 41.8 % (ref 36.0–46.0)
Hemoglobin: 14 g/dL (ref 12.0–15.0)
MCH: 30.6 pg (ref 26.0–34.0)
MCHC: 33.5 g/dL (ref 30.0–36.0)
MCV: 91.5 fL (ref 78.0–100.0)
Platelets: 277 10*3/uL (ref 150–400)
RBC: 4.57 MIL/uL (ref 3.87–5.11)
RDW: 13.4 % (ref 11.5–15.5)
WBC: 6.5 10*3/uL (ref 4.0–10.5)

## 2016-01-30 LAB — I-STAT TROPONIN, ED: Troponin i, poc: 0 ng/mL (ref 0.00–0.08)

## 2016-01-30 NOTE — ED Notes (Addendum)
Woke up with pain in right shoulder. While at church she began having double vision, then began having a terrible headache and the pain began spreading into her chest. Hx of COPD and continues to smoke. States she has been coughing more than usual, but has not noticed it becoming productive. States during the headache exacerbation she had problems standing up, "it's like my legs didn't want to work." Hx of HTN and takes a low dose aspirin daily, hasn't had HTN medications recently. No focal neuro deficits observed in triage.

## 2016-01-30 NOTE — ED Notes (Signed)
Pt told registration she no longer wanted to wait, and has decided to leave.

## 2016-01-31 ENCOUNTER — Other Ambulatory Visit: Payer: Self-pay

## 2016-02-04 NOTE — Patient Outreach (Signed)
I called Phyllis Caldwell to determine if she had heard back from [facility].  She stated she did.  She received full [facility].  She will be able to get her generic medications for $2.80 and her brand name medications for $8.30.  I will follow up in a few weeks to see how she is doing with her adherence to her medications.  She was happy to be able to have a lower co-pay on her medications.  She is not going to do mail order due to not being able to trust her mail to be delivered correctly.    Steve Rattler, PharmD, [employer] [phone]

## 2016-02-07 ENCOUNTER — Other Ambulatory Visit: Payer: Self-pay | Admitting: Licensed Clinical Social Worker

## 2016-02-07 NOTE — Patient Outreach (Signed)
[facility]) Care Management  02/07/2016  Phyllis Caldwell October 24, 1941 [id number]   Assessment-CSW completed two outreaches to patient on 02/07/16 but number remained busy. CSW was unable to leave a voice message.  Plan-CSW will complete next outreach on 02/14/16.  [name], BSW, MSW, LCSW [employer] [email or web address] Phone: [phone] Fax: [phone]

## 2016-02-08 ENCOUNTER — Other Ambulatory Visit: Payer: Self-pay | Admitting: Family Medicine

## 2016-02-08 DIAGNOSIS — R911 Solitary pulmonary nodule: Secondary | ICD-10-CM

## 2016-02-09 ENCOUNTER — Encounter: Payer: Self-pay | Admitting: Licensed Clinical Social Worker

## 2016-02-10 ENCOUNTER — Other Ambulatory Visit: Payer: Self-pay | Admitting: Licensed Clinical Social Worker

## 2016-02-10 ENCOUNTER — Encounter: Payer: Self-pay | Admitting: Licensed Clinical Social Worker

## 2016-02-10 NOTE — Patient Outreach (Signed)
  [facility]) Care Management  02/10/2016  Phyllis Caldwell 09/18/1941 [id number]   Assessment-CSW completed outreach to ptaient on 02/10/16. Patient answers. Patient reports that she wishes to complete advance directive at this time. Patient is agreeable to home visit on 02/16/16. Patient is agreeable to CSW bringing CSW Phyllis Caldwell to visit as well.  Plan-CSW will complete home visit on 02/16/16.  [name], BSW, MSW, LCSW [employer] [email or web address] Phone: [phone] Fax: [phone]

## 2016-02-15 ENCOUNTER — Other Ambulatory Visit: Payer: Self-pay | Admitting: Licensed Clinical Social Worker

## 2016-02-15 NOTE — Patient Outreach (Signed)
[facility]) Care Management  02/15/2016  Phyllis Caldwell 02/28/1941 [id number]   Assessment-CSW completed outreach to patient to remind her of upcoming home visit on 02/16/16. CSW did not answer and CSW is unable to leave a voice message due to patient's request not to leave a message.  Plan-CSW will complete home visit on 02/16/16 in order to complete advance directive.  Dickie LaBrooke [name], BSW, MSW, LCSW [facility] [facility] Care Management [email or web address] Phone: [phone] Fax: [phone]

## 2016-02-16 ENCOUNTER — Other Ambulatory Visit: Payer: Self-pay | Admitting: Licensed Clinical Social Worker

## 2016-02-16 ENCOUNTER — Encounter: Payer: Self-pay | Admitting: Licensed Clinical Social Worker

## 2016-02-16 NOTE — Patient Outreach (Signed)
[facility]) Care Management  [facility]  02/16/2016  Alonna BucklerSharon L Caldwell 03/07/1941 [id number]   Current Medications:  Current Outpatient Prescriptions  Medication Sig Dispense Refill  . amLODipine (NORVASC) 5 MG tablet Reported on 01/05/2016    . atorvastatin (LIPITOR) 20 MG tablet Take 1 tablet (20 mg total) by mouth daily at 6 PM. 30 tablet 3  . furosemide (LASIX) 20 MG tablet Take 20 mg by mouth. Take one tablet daily as needed for swelling    . levothyroxine (SYNTHROID, LEVOTHROID) 50 MCG tablet Take 50 mcg by mouth daily. Reported on 01/05/2016    . nitroGLYCERIN (NITROSTAT) 0.4 MG SL tablet Place 1 tablet (0.4 mg total) under the tongue every 5 (five) minutes x 3 doses as needed for chest pain. 25 tablet 4  . pantoprazole (PROTONIX) 40 MG tablet Take 1 tablet (40 mg total) by mouth daily at 12 noon. 30 tablet 3  . Vitamin D, Ergocalciferol, (DRISDOL) 50000 UNITS CAPS capsule Take 50,000 Units by mouth once a week. Reported on 01/05/2016     No current facility-administered medications for this visit.    Functional Status:  In your present state of health, do you have any difficulty performing the following activities: 01/05/2016  Hearing? N  Vision? Y  Difficulty concentrating or making decisions? Y  Walking or climbing stairs? Y  Dressing or bathing? Y  Doing errands, shopping? Y  Preparing Food and eating ? Y  Using the Toilet? Y  In the past six months, have you accidently leaked urine? Y  Do you have problems with loss of bowel control? N  Managing your Medications? Y  Managing your Finances? N  Housekeeping or managing your Housekeeping? N    Fall/Depression Screening:  PHQ 2/9 Scores 01/05/2016 12/27/2015  PHQ - 2 Score 6 6  PHQ- 9 Score 13 13    Assessment: CSW completed joint home visit with CSW Phyllis LevyJanet Caldwell with patient on 02/16/16. Patient and her grand daughter and daughter were present during visit. CSW provided patient with an additional  copy of an advance directive and completed entire review of document. Patient does not wish to complete it at this time but rather take time to consider health care decisions. Patient feels comfortable completing both Part A and Part B on her own. However, if she feels the need for CSW to assist her then CSW can complete an additional home visit. CSW educated family on advance directives as well. Family was educated on the difference between advance directive and DNR. CSW answered questions on document. Patient has discussed her health care wishes with both grand daughter and daughter.    CSW took time reviewing mailed community resources during session. CSW completed review  available [place] [facility] [place] [facility] [place]. [facility] counseling services. CSW also completed review of Administrator, artsenior Community Resources [place] [facility] which included financial resources.  RECENT BARRIER-Patient had x-ray completed and it showed a nodule with recommendation for further imaging. However, patient reports that it will be a $200 dollar co pay. She states "I can't afford that. It doesn't matter." CSW clinical team educated her on potential scholarship funds available to assist with this payment. Patient is aware of the payment plans that they offer. CSW will update PCP.   CSW will complete next outreach within two weeks.  Plan: CSW will complete next outreach within two weeks to assess if there any further social work needs. CSW  will route entire encounter to PCP.   Phyllis Caldwell, BSW, MSW, LCSW [employer] [email or web address] Phone: [phone] Fax: [phone]

## 2016-02-21 ENCOUNTER — Ambulatory Visit
Admission: RE | Admit: 2016-02-21 | Discharge: 2016-02-21 | Disposition: A | Discharge status: Home / Self Care | Payer: Commercial Managed Care - HMO | Source: Ambulatory Visit | Attending: Family Medicine | Admitting: Family Medicine

## 2016-02-21 DIAGNOSIS — R918 Other nonspecific abnormal finding of lung field: Secondary | ICD-10-CM | POA: Diagnosis not present

## 2016-02-21 DIAGNOSIS — R911 Solitary pulmonary nodule: Secondary | ICD-10-CM

## 2016-02-28 ENCOUNTER — Other Ambulatory Visit: Payer: Self-pay | Admitting: Licensed Clinical Social Worker

## 2016-02-28 NOTE — Patient Outreach (Signed)
[facility]) Care Management  02/28/2016  Phyllis Caldwell 05/12/1941 [id number]   Assessment-CSW completed outreach to patient on 02/28/16 and patient answered. Patient reports that she did complete the CAT scan and shared "It came back nothing. They couldn't find anything so I shouldn't have got it." Patient is worried over the co pay for the X ray. Patient states that she will try and ask family to assist with paying medical bill. Patient reports that she continues to have depressive symptoms due to isolation and staying in her apartment throughout the day. CSW educated her on ways to improve socialization. Patient reports that she continues to go to church weekly and attends a bible study which helps her. CSW encouraged patient to increase self care and educated her on ways to do so. Patient reminded of mental health resources that are available to her. Patient reports that she does not feel that services with help her but she will consider them. Patient reports that she found out that her oldest sister has been diagnosed with breast cancer. Patient reports that she is trying to be supportive of her sister during this time. Patient admits that she is forgetful with taking her medications. She reports that taking medications at different times of the day is what causes her to forget. CSW educated her on ways to remind to take medications as it is essential to her health. Patient reminded to complete advance directive. Patient denies needing CSW to complete home visit in order to assist her in completing document.  Plan-CSW will complete next outreach within 2 weeks. CSW will assist patient with all social work needs.  Dickie LaBrooke Kmari Halter, BSW, MSW, LCSW [facility] [facility] Care Management [email or web address] Phone: [phone] Fax: [phone]

## 2016-03-01 ENCOUNTER — Ambulatory Visit: Payer: Self-pay | Admitting: Licensed Clinical Social Worker

## 2016-03-13 DIAGNOSIS — E559 Vitamin D deficiency, unspecified: Secondary | ICD-10-CM | POA: Diagnosis not present

## 2016-03-13 DIAGNOSIS — I213 ST elevation (STEMI) myocardial infarction of unspecified site: Secondary | ICD-10-CM | POA: Diagnosis not present

## 2016-03-13 DIAGNOSIS — E039 Hypothyroidism, unspecified: Secondary | ICD-10-CM | POA: Diagnosis not present

## 2016-03-13 DIAGNOSIS — F322 Major depressive disorder, single episode, severe without psychotic features: Secondary | ICD-10-CM | POA: Diagnosis not present

## 2016-03-13 DIAGNOSIS — I10 Essential (primary) hypertension: Secondary | ICD-10-CM | POA: Diagnosis not present

## 2016-03-13 DIAGNOSIS — M5410 Radiculopathy, site unspecified: Secondary | ICD-10-CM | POA: Diagnosis not present

## 2016-03-16 ENCOUNTER — Encounter: Payer: Self-pay | Admitting: Licensed Clinical Social Worker

## 2016-03-16 ENCOUNTER — Other Ambulatory Visit: Payer: Self-pay | Admitting: Licensed Clinical Social Worker

## 2016-03-16 NOTE — Patient Outreach (Signed)
[facility]) Care Management  03/16/2016  Phyllis BucklerSharon L Caldwell 11/15/1941 [id number]   Assessment- CSW completed outreach to patient. Patient answered and provided HIPPA verifications. Patient reports that she is doing "okay" but is still experiencing symptoms of depression. [facility] to receive counseling services. She states that she does not "feel up to it right now." She denies suicidal or homicidal ideations. Patient has not completed advance directives but states she will do so when she has the time and does not need CSW assistance. Patient reports that she is struggling financially right now. Patient reports she has 4 dollars to live off of until next week. She reports that her granddaughter lost her job and has also been dealing with her own health concerns. She states that her daughter completed an interview last Friday but did not receive the job. CSW provided emotional support during this time. CSW reviewed available community resources. CSW informed her that she will be completing a case closure at this time as all community resources have been provided but patient is not ready to follow through with these resources at this time. Patient is agreeable to discharge. Patient is aware that she can contact this CSW if ever needed.  Plan-CSW will inform [facility] Assistant of discharge. CSW will send case closure letter to PCP.  Phyllis Caldwell, BSW, MSW, LCSW [facility] [facility] Care Management [email or web address] Phone: [phone] Fax: [phone]     Grand daughter started job. But something happened. Daughter interview last Friday.

## 2016-07-20 DIAGNOSIS — M549 Dorsalgia, unspecified: Secondary | ICD-10-CM | POA: Diagnosis not present

## 2016-09-09 ENCOUNTER — Emergency Department (HOSPITAL_COMMUNITY): Payer: Commercial Managed Care - HMO

## 2016-09-09 ENCOUNTER — Inpatient Hospital Stay (HOSPITAL_COMMUNITY)
Admission: EM | Admit: 2016-09-09 | Discharge: 2016-09-11 | DRG: 603 | Disposition: A | Discharge status: Home / Self Care | Payer: Commercial Managed Care - HMO | Attending: Internal Medicine | Admitting: Internal Medicine

## 2016-09-09 ENCOUNTER — Encounter (HOSPITAL_COMMUNITY): Payer: Self-pay | Admitting: Nurse Practitioner

## 2016-09-09 DIAGNOSIS — Z79899 Other long term (current) drug therapy: Secondary | ICD-10-CM

## 2016-09-09 DIAGNOSIS — R1310 Dysphagia, unspecified: Secondary | ICD-10-CM | POA: Diagnosis not present

## 2016-09-09 DIAGNOSIS — F329 Major depressive disorder, single episode, unspecified: Secondary | ICD-10-CM | POA: Diagnosis not present

## 2016-09-09 DIAGNOSIS — Z91018 Allergy to other foods: Secondary | ICD-10-CM | POA: Diagnosis not present

## 2016-09-09 DIAGNOSIS — I10 Essential (primary) hypertension: Secondary | ICD-10-CM

## 2016-09-09 DIAGNOSIS — F17210 Nicotine dependence, cigarettes, uncomplicated: Secondary | ICD-10-CM | POA: Diagnosis present

## 2016-09-09 DIAGNOSIS — K089 Disorder of teeth and supporting structures, unspecified: Secondary | ICD-10-CM | POA: Diagnosis not present

## 2016-09-09 DIAGNOSIS — L03211 Cellulitis of face: Principal | ICD-10-CM

## 2016-09-09 DIAGNOSIS — I252 Old myocardial infarction: Secondary | ICD-10-CM | POA: Diagnosis not present

## 2016-09-09 DIAGNOSIS — Z882 Allergy status to sulfonamides status: Secondary | ICD-10-CM | POA: Diagnosis not present

## 2016-09-09 DIAGNOSIS — E079 Disorder of thyroid, unspecified: Secondary | ICD-10-CM

## 2016-09-09 DIAGNOSIS — Z720 Tobacco use: Secondary | ICD-10-CM | POA: Diagnosis present

## 2016-09-09 DIAGNOSIS — J449 Chronic obstructive pulmonary disease, unspecified: Secondary | ICD-10-CM | POA: Diagnosis present

## 2016-09-09 DIAGNOSIS — E890 Postprocedural hypothyroidism: Secondary | ICD-10-CM | POA: Diagnosis present

## 2016-09-09 DIAGNOSIS — Z8249 Family history of ischemic heart disease and other diseases of the circulatory system: Secondary | ICD-10-CM | POA: Diagnosis not present

## 2016-09-09 DIAGNOSIS — R05 Cough: Secondary | ICD-10-CM | POA: Diagnosis not present

## 2016-09-09 DIAGNOSIS — R059 Cough, unspecified: Secondary | ICD-10-CM

## 2016-09-09 DIAGNOSIS — R220 Localized swelling, mass and lump, head: Secondary | ICD-10-CM | POA: Diagnosis not present

## 2016-09-09 DIAGNOSIS — H269 Unspecified cataract: Secondary | ICD-10-CM | POA: Diagnosis present

## 2016-09-09 DIAGNOSIS — I2510 Atherosclerotic heart disease of native coronary artery without angina pectoris: Secondary | ICD-10-CM | POA: Diagnosis not present

## 2016-09-09 DIAGNOSIS — Z880 Allergy status to penicillin: Secondary | ICD-10-CM

## 2016-09-09 DIAGNOSIS — E039 Hypothyroidism, unspecified: Secondary | ICD-10-CM | POA: Diagnosis present

## 2016-09-09 HISTORY — DX: Tobacco use: Z72.0

## 2016-09-09 HISTORY — DX: Disorder of teeth and supporting structures, unspecified: K08.9

## 2016-09-09 LAB — LIPID PANEL
Cholesterol: 187 mg/dL (ref 0–200)
HDL: 47 mg/dL (ref >40)
LDL Cholesterol: 121 mg/dL — ABNORMAL HIGH (ref 0–99)
Total CHOL/HDL Ratio: 4 RATIO
Triglycerides: 93 mg/dL (ref <150)
VLDL: 19 mg/dL (ref 0–40)

## 2016-09-09 LAB — BASIC METABOLIC PANEL
Anion gap: 8 (ref 5–15)
BUN: 10 mg/dL (ref 6–20)
CO2: 25 mmol/L (ref 22–32)
Calcium: 9.9 mg/dL (ref 8.9–10.3)
Chloride: 104 mmol/L (ref 101–111)
Creatinine, Ser: 0.87 mg/dL (ref 0.44–1.00)
GFR calc Af Amer: >60 mL/min (ref >60)
GFR calc non Af Amer: >60 mL/min (ref >60)
Glucose, Bld: 97 mg/dL (ref 65–99)
Potassium: 4 mmol/L (ref 3.5–5.1)
Sodium: 137 mmol/L (ref 135–145)

## 2016-09-09 LAB — CBC WITH DIFFERENTIAL/PLATELET
Basophils Absolute: 0 10*3/uL (ref 0.0–0.1)
Basophils Relative: 0 %
Eosinophils Absolute: 0.2 10*3/uL (ref 0.0–0.7)
Eosinophils Relative: 2 %
HCT: 44.1 % (ref 36.0–46.0)
Hemoglobin: 14.6 g/dL (ref 12.0–15.0)
Lymphocytes Relative: 24 %
Lymphs Abs: 2.1 10*3/uL (ref 0.7–4.0)
MCH: 30.4 pg (ref 26.0–34.0)
MCHC: 33.1 g/dL (ref 30.0–36.0)
MCV: 91.7 fL (ref 78.0–100.0)
Monocytes Absolute: 0.7 10*3/uL (ref 0.1–1.0)
Monocytes Relative: 7 %
Neutro Abs: 6 10*3/uL (ref 1.7–7.7)
Neutrophils Relative %: 67 %
Platelets: 281 10*3/uL (ref 150–400)
RBC: 4.81 MIL/uL (ref 3.87–5.11)
RDW: 13.4 % (ref 11.5–15.5)
WBC: 9 10*3/uL (ref 4.0–10.5)

## 2016-09-09 LAB — TSH: TSH: 2.982 u[IU]/mL (ref 0.350–4.500)

## 2016-09-09 MED ORDER — SODIUM CHLORIDE 0.9 % IV SOLN: INTRAVENOUS | Status: AC

## 2016-09-09 MED ORDER — DIPHENHYDRAMINE HCL 50 MG/ML IJ SOLN: 12.5000 mg | Freq: Three times a day (TID) | INTRAMUSCULAR | Status: DC | PRN

## 2016-09-09 MED ORDER — ONDANSETRON HCL 4 MG PO TABS: 4.0000 mg | ORAL_TABLET | Freq: Four times a day (QID) | ORAL | Status: DC | PRN

## 2016-09-09 MED ORDER — ACETAMINOPHEN 650 MG RE SUPP: 650.0000 mg | Freq: Four times a day (QID) | RECTAL | Status: DC | PRN

## 2016-09-09 MED ORDER — FAMOTIDINE IN NACL 20-0.9 MG/50ML-% IV SOLN
20.0000 mg | Freq: Two times a day (BID) | INTRAVENOUS | Status: DC | PRN
  Filled 2016-09-09: qty 50

## 2016-09-09 MED ORDER — LEVOTHYROXINE SODIUM 50 MCG PO TABS
50.0000 ug | ORAL_TABLET | Freq: Every day | ORAL | Status: DC
  Filled 2016-09-09: qty 1

## 2016-09-09 MED ORDER — CLINDAMYCIN PHOSPHATE 600 MG/50ML IV SOLN
600.0000 mg | Freq: Two times a day (BID) | INTRAVENOUS | Status: DC
  Filled 2016-09-09: qty 50

## 2016-09-09 MED ORDER — AMLODIPINE BESYLATE 5 MG PO TABS
5.0000 mg | ORAL_TABLET | Freq: Every day | ORAL | Status: DC
  Filled 2016-09-09 (×2): qty 1

## 2016-09-09 MED ORDER — PANTOPRAZOLE SODIUM 40 MG PO TBEC
40.0000 mg | DELAYED_RELEASE_TABLET | Freq: Every day | ORAL | Status: DC
  Filled 2016-09-09: qty 1

## 2016-09-09 MED ORDER — DEXAMETHASONE SODIUM PHOSPHATE 10 MG/ML IJ SOLN: 10.0000 mg | Freq: Once | INTRAMUSCULAR | Status: DC

## 2016-09-09 MED ORDER — SENNOSIDES-DOCUSATE SODIUM 8.6-50 MG PO TABS
1.0000 | ORAL_TABLET | Freq: Every evening | ORAL | Status: DC | PRN
Start: 2016-09-09 — End: 2016-09-11

## 2016-09-09 MED ORDER — DIPHENHYDRAMINE HCL 50 MG/ML IJ SOLN
12.5000 mg | Freq: Once | INTRAMUSCULAR | Status: AC
  Administered 2016-09-09: 12.5 mg via INTRAVENOUS
  Filled 2016-09-09: qty 1

## 2016-09-09 MED ORDER — ACETAMINOPHEN 325 MG PO TABS
650.0000 mg | ORAL_TABLET | Freq: Four times a day (QID) | ORAL | Status: DC | PRN
  Administered 2016-09-10: 650 mg via ORAL
  Filled 2016-09-09: qty 2

## 2016-09-09 MED ORDER — ONDANSETRON HCL 4 MG/2ML IJ SOLN: 4.0000 mg | Freq: Four times a day (QID) | INTRAMUSCULAR | Status: DC | PRN

## 2016-09-09 MED ORDER — FAMOTIDINE IN NACL 20-0.9 MG/50ML-% IV SOLN
20.0000 mg | Freq: Once | INTRAVENOUS | Status: AC
  Administered 2016-09-09: 20 mg via INTRAVENOUS
  Filled 2016-09-09: qty 50

## 2016-09-09 MED ORDER — CLINDAMYCIN PHOSPHATE 600 MG/50ML IV SOLN
600.0000 mg | Freq: Once | INTRAVENOUS | Status: AC
  Administered 2016-09-09: 600 mg via INTRAVENOUS
  Filled 2016-09-09: qty 50

## 2016-09-09 MED ORDER — DIPHENHYDRAMINE HCL 50 MG/ML IJ SOLN: 25.0000 mg | Freq: Once | INTRAMUSCULAR | Status: DC

## 2016-09-09 MED ORDER — TRAZODONE HCL 50 MG PO TABS: 25.0000 mg | ORAL_TABLET | Freq: Every evening | ORAL | Status: DC | PRN

## 2016-09-09 MED ORDER — ATORVASTATIN CALCIUM 20 MG PO TABS
20.0000 mg | ORAL_TABLET | Freq: Every day | ORAL | Status: DC
  Filled 2016-09-09: qty 1

## 2016-09-09 MED ORDER — HYDROCODONE-ACETAMINOPHEN 5-325 MG PO TABS: 1.0000 | ORAL_TABLET | ORAL | Status: DC | PRN

## 2016-09-09 MED ORDER — ENOXAPARIN SODIUM 40 MG/0.4ML ~~LOC~~ SOLN
40.0000 mg | SUBCUTANEOUS | Status: DC
  Filled 2016-09-09: qty 0.4

## 2016-09-09 MED ORDER — CLINDAMYCIN PHOSPHATE 600 MG/50ML IV SOLN
600.0000 mg | Freq: Two times a day (BID) | INTRAVENOUS | Status: DC
  Administered 2016-09-09: 600 mg via INTRAVENOUS
  Filled 2016-09-09 (×3): qty 50

## 2016-09-09 MED ORDER — IOPAMIDOL (ISOVUE-300) INJECTION 61%
75.0000 mL | Freq: Once | INTRAVENOUS | Status: AC | PRN
  Administered 2016-09-09: 75 mL via INTRAVENOUS

## 2016-09-09 NOTE — ED Provider Notes (Signed)
[facility]-EMERGENCY DEPT Provider Note   CSN: [id number] Arrival date & time: 09/09/16  1105     History   Chief Complaint Chief Complaint  Patient presents with  . Facial Swelling    HPI Phyllis Caldwell is a 75 y.o. female.  Phyllis Caldwell is a 75 y.o. female with history of asthma, COPD, CAD, depression, hypertension presents to the ED with complaint of facial swelling. Patient's son noticed right-sided facial swelling last night around 9 PM at that time patient refused to go to the ED. Patient awoke this morning with increasing right-sided facial swelling and family convinced her to come to the emergency department for further evaluation. She had associated sensation of tongue and throat swelling, difficulty swallowing, difficulty breathing. She denies pain; however, states the area feels "raw and tight." Patient ate at [employer] last night and had crab and lobster, she has had these foods in the past without reaction. She denies any consumption of food she is allergic to. She is not on a ACE inhibitor. She also endorses a recent productive cough with yellow white phlegm and chest tightness. She denies chest pain or URI-like symptoms. No abdominal pain, nausea, vomiting, loss of consciousness. No treatments tried prior to arrival.    Past Medical History:  Diagnosis Date  . Asthma   . Cataract   . COPD (chronic obstructive pulmonary disease) (HCC)   . Coronary artery disease   . Depression   . Hypertension   . Thyroid disease     Patient Active Problem List   Diagnosis Date Noted  . Cellulitis diffuse, face 09/09/2016  . Thyroid disease   . Hypertension   . COPD (chronic obstructive pulmonary disease) (HCC) 12/30/2013  . Tobacco use 12/30/2013  . CAD in native artery 12/30/2013  . Old MI (myocardial infarction) 12/30/2013  . Essential hypertension 12/30/2013    Past Surgical History:  Procedure Laterality Date  . ABDOMINAL HYSTERECTOMY    . LEFT HEART CATHETERIZATION  WITH CORONARY ANGIOGRAM N/A 10/07/2012   Procedure: LEFT HEART CATHETERIZATION WITH CORONARY ANGIOGRAM;  Surgeon: Robynn Pane, MD;  Location: [facility] CATH LAB;  Service: Cardiovascular;  Laterality: N/A;  . THYROID SURGERY      OB History    No data available       Home Medications    Prior to Admission medications   Medication Sig Start Date End Date Taking? Authorizing Provider  amLODipine (NORVASC) 5 MG tablet Reported on 01/05/2016 12/18/13   Historical Provider, MD  atorvastatin (LIPITOR) 20 MG tablet Take 1 tablet (20 mg total) by mouth daily at 6 PM. 10/09/12   Rinaldo Cloud, MD  furosemide (LASIX) 20 MG tablet Take 20 mg by mouth. Take one tablet daily as needed for swelling    Historical Provider, MD  levothyroxine (SYNTHROID, LEVOTHROID) 50 MCG tablet Take 50 mcg by mouth daily. Reported on 01/05/2016    Historical Provider, MD  nitroGLYCERIN (NITROSTAT) 0.4 MG SL tablet Place 1 tablet (0.4 mg total) under the tongue every 5 (five) minutes x 3 doses as needed for chest pain. 03/08/15   Lyn Records, MD  pantoprazole (PROTONIX) 40 MG tablet Take 1 tablet (40 mg total) by mouth daily at 12 noon. 10/09/12   Rinaldo Cloud, MD  Vitamin D, Ergocalciferol, (DRISDOL) 50000 UNITS CAPS capsule Take 50,000 Units by mouth once a week. Reported on 01/05/2016 06/24/15   Historical Provider, MD    Family History Family History  Problem Relation Age of Onset  .  Heart disease Mother   . Heart attack Mother   . Stroke Mother   . Heart attack Father   . Prostate cancer Father     Social History Social History  Substance Use Topics  . Smoking status: Current Every Day Smoker  . Smokeless tobacco: Never Used  . Alcohol use No     Allergies   Coconut fatty acids; Penicillins; and Sulfa antibiotics   Review of Systems Review of Systems  Constitutional: Negative for fever.  HENT: Positive for dental problem, facial swelling and trouble swallowing. Negative for congestion and rhinorrhea.    Eyes: Positive for visual disturbance ( "letters get changed," chronic).  Respiratory: Positive for cough, chest tightness and shortness of breath.   Cardiovascular: Negative for chest pain and leg swelling.  Gastrointestinal: Negative for abdominal pain, nausea and vomiting.  Genitourinary: Negative for dysuria and hematuria.  Skin: Positive for color change.  Allergic/Immunologic: Negative for immunocompromised state.  Neurological: Negative for syncope.     Physical Exam Updated Vital Signs BP 156/95   Pulse 65   Temp 98.3 F (36.8 C) (Oral)   Resp 16   Ht 5\' 9"  (1.753 m)   Wt 68.9 kg   SpO2 100%   BMI 22.45 kg/m   Physical Exam  Constitutional: She appears well-developed and well-nourished. No distress.  HENT:  Head: Atraumatic.  Right Ear: Tympanic membrane, external ear and ear canal normal.  Left Ear: Tympanic membrane, external ear and ear canal normal.  Mouth/Throat: Uvula is midline, oropharynx is clear and moist and mucous membranes are normal. No trismus in the jaw. Abnormal dentition. Dental caries present. No uvula swelling. No oropharyngeal exudate or posterior oropharyngeal edema.  No trismus. Uvula midline. No posterior oropharynx edema. Managing oral secretions.   Eyes: Conjunctivae and EOM are normal. Pupils are equal, round, and reactive to light. Right eye exhibits no discharge. Left eye exhibits no discharge. No scleral icterus.  Neck: Normal range of motion and phonation normal. Neck supple. No neck rigidity. Normal range of motion present.  No stridor. No nuchal rigidity.   Cardiovascular: Normal rate, regular rhythm, normal heart sounds and intact distal pulses.   No murmur heard. Pulmonary/Chest: Effort normal and breath sounds normal. No stridor. No respiratory distress. She has no wheezes. She has no rales.  Abdominal: Soft. Bowel sounds are normal. She exhibits no distension. There is no tenderness. There is no rigidity, no rebound, no guarding and  no CVA tenderness.  Musculoskeletal: Normal range of motion.  Lymphadenopathy:    She has no cervical adenopathy.  Neurological: She is alert. She is not disoriented. Coordination and gait normal. GCS eye subscore is 4. GCS verbal subscore is 5. GCS motor subscore is 6.  Skin: Skin is warm and dry. She is not diaphoretic.  Right sided facial swelling and redness.   Psychiatric: She has a normal mood and affect. Her behavior is normal.     ED Treatments / Results  Labs (all labs ordered are listed, but only abnormal results are displayed) Labs Reviewed  BASIC METABOLIC PANEL  CBC WITH DIFFERENTIAL/PLATELET  TSH    EKG  EKG Interpretation None       Radiology Ct Soft Tissue Neck W Contrast  Result Date: 09/09/2016 CLINICAL DATA:  Pt woke with RIGHT facial swelling,redness @ 0200; pt states that her tongue feels swollen, her throat feels tight, but pt denies actual pain EXAM: CT MAXILLOFACIAL WITH CONTRAST CT NECK WITH CONTRAST TECHNIQUE: Contiguous axial images were obtained from  the base of the skull through the vertex without and with intravenous contrast. Multidetector CT imaging of the and neck was performed using the standard protocol following the bolus administration of intravenous contrast. CONTRAST:  75mL ISOVUE-300 IOPAMIDOL (ISOVUE-300) INJECTION 61%<Contrast>67mL ISOVUE-300 IOPAMIDOL (ISOVUE-300) INJECTION 61% COMPARISON:  None. FINDINGS: CT MAXILLOFACIAL FINDINGS No fractures. There is subcutaneous edema along the right mandibular region without a defined abscess or mass. Soft tissues are otherwise unremarkable. Lymph nodes:  No adenopathy. Limited intracranial: No acute or significant abnormality. Age related volume loss and vascular calcifications at the skullbase. Globes and orbits: Status post bilateral cataract surgery. Otherwise unremarkable. Sinuses, mastoid air cells and middle ear cavities:  Clear. CT NECK FINDINGS Pharynx and larynx: Normal. No mass or swelling.  Salivary glands: No inflammation, mass, or stone. Thyroid: Status post right thyroidectomy. Left thyroid lobe is unremarkable. Lymph nodes: None enlarged or abnormal density. Vascular: Minor carotid vascular calcifications. Otherwise unremarkable. Limited intracranial: Age related atrophy.  No acute finding. Visualized orbits: Status post cataract surgery. Otherwise unremarkable. Mastoids and visualized paranasal sinuses: Clear. Skeleton: No fracture. No bone lesion. There are disc degenerative changes in the lower cervical spine. Upper chest: Unremarkable. Other: There is periapical lucency around several of the anterior maxillary teeth. IMPRESSION: 1. Subcutaneous soft tissue edema over the right mandible with no soft tissue mass or evidence of an abscess. No soft tissue masses. No adenopathy. 2. Clear sinuses, mastoid air cells and middle ear cavities. 3. Widely patent airway. No laryngeal or pharyngeal soft tissue swelling. Electronically Signed   By: Amie Portland M.D.   On: 09/09/2016 14:02   Ct Maxillofacial W Contrast  Result Date: 09/09/2016 CLINICAL DATA:  Pt woke with RIGHT facial swelling,redness @ 0200; pt states that her tongue feels swollen, her throat feels tight, but pt denies actual pain EXAM: CT MAXILLOFACIAL WITH CONTRAST CT NECK WITH CONTRAST TECHNIQUE: Contiguous axial images were obtained from the base of the skull through the vertex without and with intravenous contrast. Multidetector CT imaging of the and neck was performed using the standard protocol following the bolus administration of intravenous contrast. CONTRAST:  75mL ISOVUE-300 IOPAMIDOL (ISOVUE-300) INJECTION 61%<Contrast>72mL ISOVUE-300 IOPAMIDOL (ISOVUE-300) INJECTION 61% COMPARISON:  None. FINDINGS: CT MAXILLOFACIAL FINDINGS No fractures. There is subcutaneous edema along the right mandibular region without a defined abscess or mass. Soft tissues are otherwise unremarkable. Lymph nodes:  No adenopathy. Limited intracranial:  No acute or significant abnormality. Age related volume loss and vascular calcifications at the skullbase. Globes and orbits: Status post bilateral cataract surgery. Otherwise unremarkable. Sinuses, mastoid air cells and middle ear cavities:  Clear. CT NECK FINDINGS Pharynx and larynx: Normal. No mass or swelling. Salivary glands: No inflammation, mass, or stone. Thyroid: Status post right thyroidectomy. Left thyroid lobe is unremarkable. Lymph nodes: None enlarged or abnormal density. Vascular: Minor carotid vascular calcifications. Otherwise unremarkable. Limited intracranial: Age related atrophy.  No acute finding. Visualized orbits: Status post cataract surgery. Otherwise unremarkable. Mastoids and visualized paranasal sinuses: Clear. Skeleton: No fracture. No bone lesion. There are disc degenerative changes in the lower cervical spine. Upper chest: Unremarkable. Other: There is periapical lucency around several of the anterior maxillary teeth. IMPRESSION: 1. Subcutaneous soft tissue edema over the right mandible with no soft tissue mass or evidence of an abscess. No soft tissue masses. No adenopathy. 2. Clear sinuses, mastoid air cells and middle ear cavities. 3. Widely patent airway. No laryngeal or pharyngeal soft tissue swelling. Electronically Signed   By: Renard Hamper.D.  On: 09/09/2016 14:02   Dg Chest Port 1 View  Result Date: 09/09/2016 CLINICAL DATA:  Pt reports cough x 2-3 days; she reports facial and tongue swelling onset yesterday; she reports h/o COPD, asthma, and HTN; smoker EXAM: PORTABLE CHEST 1 VIEW COMPARISON:  01/30/2016 FINDINGS: Cardiac silhouette is normal in size. The aorta is tortuous. No mediastinal or hilar masses or evidence of adenopathy. Lungs are clear.  No pleural effusion.  No pneumothorax. Skeletal structures are grossly intact. IMPRESSION: No acute cardiopulmonary disease. Electronically Signed   By: Amie Portland M.D.   On: 09/09/2016 11:57    Procedures Procedures  (including critical care time)  Medications Ordered in ED Medications  amLODipine (NORVASC) tablet 5 mg (not administered)  atorvastatin (LIPITOR) tablet 20 mg (not administered)  levothyroxine (SYNTHROID, LEVOTHROID) tablet 50 mcg (not administered)  pantoprazole (PROTONIX) EC tablet 40 mg (not administered)  enoxaparin (LOVENOX) injection 40 mg (not administered)  0.9 %  sodium chloride infusion (not administered)  acetaminophen (TYLENOL) tablet 650 mg (not administered)    Or  acetaminophen (TYLENOL) suppository 650 mg (not administered)  HYDROcodone-acetaminophen (NORCO/VICODIN) 5-325 MG per tablet 1-2 tablet (not administered)  traZODone (DESYREL) tablet 25 mg (not administered)  senna-docusate (Senokot-S) tablet 1 tablet (not administered)  ondansetron (ZOFRAN) tablet 4 mg (not administered)    Or  ondansetron (ZOFRAN) injection 4 mg (not administered)  diphenhydrAMINE (BENADRYL) injection 12.5 mg (not administered)  clindamycin (CLEOCIN) IVPB 600 mg (not administered)  famotidine (PEPCID) IVPB 20 mg premix (not administered)  famotidine (PEPCID) IVPB 20 mg premix (0 mg Intravenous Stopped 09/09/16 1259)  diphenhydrAMINE (BENADRYL) injection 12.5 mg (12.5 mg Intravenous Given 09/09/16 1134)  iopamidol (ISOVUE-300) 61 % injection 75 mL (75 mLs Intravenous Contrast Given 09/09/16 1350)  clindamycin (CLEOCIN) IVPB 600 mg (600 mg Intravenous New Bag/Given 09/09/16 1448)     Initial Impression / Assessment and Plan / ED Course  I have reviewed the triage vital signs and the nursing notes.  Pertinent labs & imaging results that were available during my care of the patient were reviewed by me and considered in my medical decision making (see chart for details).  Clinical Course  Comment By Time  Seen and examined.  Right sided facial swelling and redness.  Pt has poor dentition.  No posterior pharynx redness or swelling.  Nl speech.  No stridor.  No trouble with swallowing.  Favor  infection over allergic reaction.  Will check labs and get ct imaging Linwood Dibbles, MD 09/30 1127  Improved swelling on re-evaluation.  Lona Kettle, [place] 09/30 1439   Patient presents to ED with complaint of right sided facial swelling and sensation of tongue/throat swelling. Patient is afebrile and non-toxic appearing in NAD. Vital signs remarkable for elevated BP, otherwise stable. Physical exam remarkable for right sided facial swelling and erythema. No posterior oropharynx edema. No trismus. No nuchal rigidity. No stridor. Patient maintaining oral secretions and airway. Concern for facial cellulitis vs. Aallergic reaction. Benadryl and Pepcid given. Hold on steroids with concern for infection. Will check labs and CT face/neck/Labs are re-assuring. Discussed patient with Dr. Lynelle Doctor, who also saw patient, agrees with plan.  On re-evaluation CT neck/face remarkable for subcutaneous soft tissue edema, no evidence of abscess, no laryngeal or pharyngeal edema. CXR negative for acute cardiopulmonary process. Swelling has improved. Given facial swelling and sensation of throat/tongue swelling consider obs admission for IV ABX and further observation of airway. Consult to hospitalist placed.   3:02 PM: Spoke with Toya Smothers  NP of [facility], greatly appreciate her time and input. Agree to admission of IV ABX for possible facial cellulitis.    Final Clinical Impressions(s) / ED Diagnoses   Final diagnoses:  Facial cellulitis    New Prescriptions New Prescriptions   No medications on file     Lona Kettle, PA-C 09/09/16 1520    Linwood Dibbles, MD 09/10/16 [phone]

## 2016-09-09 NOTE — ED Notes (Signed)
Patient went to CT

## 2016-09-09 NOTE — H&P (Signed)
History and Physical    Phyllis Caldwell UJW:[id number] DOB: 03-19-1941 DOA: 09/09/2016  PCP: Emeterio Reeve, MD Patient coming from: home  Chief Complaint: facial swelling and redness  HPI: Phyllis Caldwell is a pleasant  75 y.o. female with medical history significant for CAD, hypertension, thyroid disease, tobacco use, COPD not on home oxygen, reported dentition presents emergency Department chief complaint sudden onset of facial swelling erythema. Initial evaluation reveals facial cellulitis via CT and some concern for early abscess.  Information is obtained from the patient. She states she was in her usual state of health until about 2 AM she awakened to go to the restroom and family member noted that her face was swollen and red. She denies any pain itching difficulty breathing. She states she went back to bed. She awakened this morning with worsening edema and swelling on right side of face extending up into the right. This point time she states she thought her tongue felt thick and she had mild difficulty swallowing. She denies any difficulty breathing. She denies pain fever chills. She does report her right cheek is tender to palpation. She ate at [employer] the night before but has done so several times and doubts allergic reaction. Also states that her right canine to "shattered" She does not take an ACE inhibitor. Associated symptoms include recent productive cough with a quite sputum and intermittent chest tightness. She states tightness located left anterior nonradiating like when she "has an asthma attack". She denies abdominal pain nausea vomiting fever chills recent travel or sick contacts. Denies dysuria hematuria frequency or urgency. She denies diarrhea constipation melena or bright red blood per rectum.     ED Course: The emergency department she is afebrile hemodynamically stable and not hypoxic. She is provided with IV fluids and IV clindamycin.  Review of Systems: As per HPI  otherwise 10 point review of systems negative.   Ambulatory Status: She ambulates with a cane with a fairly steady gait no recent falls  Past Medical History:  Diagnosis Date  . Asthma   . Cataract   . COPD (chronic obstructive pulmonary disease) (HCC)   . Coronary artery disease   . Depression   . Hypertension   . Thyroid disease     Past Surgical History:  Procedure Laterality Date  . ABDOMINAL HYSTERECTOMY    . LEFT HEART CATHETERIZATION WITH CORONARY ANGIOGRAM N/A 10/07/2012   Procedure: LEFT HEART CATHETERIZATION WITH CORONARY ANGIOGRAM;  Surgeon: Robynn Pane, MD;  Location: [facility] CATH LAB;  Service: Cardiovascular;  Laterality: N/A;  . THYROID SURGERY      Social History   Social History  . Marital status: Widowed    Spouse name: N/A  . Number of children: N/A  . Years of education: N/A   Occupational History  . Not on file.   Social History Main Topics  . Smoking status: Current Every Day Smoker  . Smokeless tobacco: Never Used  . Alcohol use No  . Drug use: Unknown  . Sexual activity: No   Other Topics Concern  . Not on file   Social History Narrative  . No narrative on file    Allergies  Allergen Reactions  . Coconut Fatty Acids     unknown  . Codeine Nausea And Vomiting  . Penicillins     unknown  . Sulfa Antibiotics     unknown    Family History  Problem Relation Age of Onset  . Heart disease Mother   . Heart  attack Mother   . Stroke Mother   . Heart attack Father   . Prostate cancer Father     Prior to Admission medications   Medication Sig Start Date End Date Taking? Authorizing Provider  acetaminophen (TYLENOL) 325 MG tablet Take 650 mg by mouth every 6 (six) hours as needed for mild pain.   Yes Historical Provider, MD    Physical Exam: Vitals:   09/09/16 1331 09/09/16 1345 09/09/16 1447 09/09/16 1515  BP: 167/91 151/96 156/95 166/94  Pulse: 67 (!) 55 65 (!) 55  Resp: 20 18 16 18   Temp:      TempSrc:      SpO2: 98% 100%  100% 98%  Weight:      Height:         General:  Appears calm and comfortable No acute distress Eyes:  PERRL, EOMI, normal lids, iris ENT:  grossly normal hearing, lips & tongue, because membranes of her mouth are moist and pink she has very poor dentition managing oral secretions without difficulty or stridor Neck:  no LAD, masses or thyromegaly Cardiovascular:  RRR, no m/r/g. No LE edema.  Respiratory:  Mild increased work of breathing with conversation breath sounds with fair air movement coarse throughout no crackles some wheezing diffusely. Abdomen:  soft, ntnd, is a bowel sounds no guarding or rebounding Skin:  Left side of face and left eyelid with erythema and swelling mild tenderness right cheek next to the base of nose Musculoskeletal:  grossly normal tone BUE/BLE, good ROM, no bony abnormality Psychiatric:  grossly normal mood and affect, speech fluent and appropriate, AOx3 Neurologic:  CN 2-12 grossly intact, moves all extremities in coordinated fashion, sensation intact  Labs on Admission: I have personally reviewed following labs and imaging studies  CBC:  Recent Labs Lab 09/09/16 1129  WBC 9.0  NEUTROABS 6.0  HGB 14.6  HCT 44.1  MCV 91.7  PLT 281   Basic Metabolic Panel:  Recent Labs Lab 09/09/16 1129  NA 137  K 4.0  CL 104  CO2 25  GLUCOSE 97  BUN 10  CREATININE 0.87  CALCIUM 9.9   GFR: Estimated Creatinine Clearance: 59.3 mL/min (by C-G formula based on SCr of 0.87 mg/dL). Liver Function Tests: No results for input(s): AST, ALT, ALKPHOS, BILITOT, PROT, ALBUMIN in the last 168 hours. No results for input(s): LIPASE, AMYLASE in the last 168 hours. No results for input(s): AMMONIA in the last 168 hours. Coagulation Profile: No results for input(s): INR, PROTIME in the last 168 hours. Cardiac Enzymes: No results for input(s): CKTOTAL, CKMB, CKMBINDEX, TROPONINI in the last 168 hours. BNP (last 3 results) No results for input(s): PROBNP in the last  8760 hours. HbA1C: No results for input(s): HGBA1C in the last 72 hours. CBG: No results for input(s): GLUCAP in the last 168 hours. Lipid Profile: No results for input(s): CHOL, HDL, LDLCALC, TRIG, CHOLHDL, LDLDIRECT in the last 72 hours. Thyroid Function Tests: No results for input(s): TSH, T4TOTAL, FREET4, T3FREE, THYROIDAB in the last 72 hours. Anemia Panel: No results for input(s): VITAMINB12, FOLATE, FERRITIN, TIBC, IRON, RETICCTPCT in the last 72 hours. Urine analysis: No results found for: COLORURINE, APPEARANCEUR, LABSPEC, PHURINE, GLUCOSEU, HGBUR, BILIRUBINUR, KETONESUR, PROTEINUR, UROBILINOGEN, NITRITE, LEUKOCYTESUR  Creatinine Clearance: Estimated Creatinine Clearance: 59.3 mL/min (by C-G formula based on SCr of 0.87 mg/dL).  Sepsis Labs: @LABRCNTIP (procalcitonin:4,lacticidven:4) )No results found for this or any previous visit (from the past 240 hour(s)).   Radiological Exams on Admission: Ct Soft Tissue Neck W  Contrast  Result Date: 09/09/2016 CLINICAL DATA:  Pt woke with RIGHT facial swelling,redness @ 0200; pt states that her tongue feels swollen, her throat feels tight, but pt denies actual pain EXAM: CT MAXILLOFACIAL WITH CONTRAST CT NECK WITH CONTRAST TECHNIQUE: Contiguous axial images were obtained from the base of the skull through the vertex without and with intravenous contrast. Multidetector CT imaging of the and neck was performed using the standard protocol following the bolus administration of intravenous contrast. CONTRAST:  75mL ISOVUE-300 IOPAMIDOL (ISOVUE-300) INJECTION 61%<Contrast>44mL ISOVUE-300 IOPAMIDOL (ISOVUE-300) INJECTION 61% COMPARISON:  None. FINDINGS: CT MAXILLOFACIAL FINDINGS No fractures. There is subcutaneous edema along the right mandibular region without a defined abscess or mass. Soft tissues are otherwise unremarkable. Lymph nodes:  No adenopathy. Limited intracranial: No acute or significant abnormality. Age related volume loss and vascular  calcifications at the skullbase. Globes and orbits: Status post bilateral cataract surgery. Otherwise unremarkable. Sinuses, mastoid air cells and middle ear cavities:  Clear. CT NECK FINDINGS Pharynx and larynx: Normal. No mass or swelling. Salivary glands: No inflammation, mass, or stone. Thyroid: Status post right thyroidectomy. Left thyroid lobe is unremarkable. Lymph nodes: None enlarged or abnormal density. Vascular: Minor carotid vascular calcifications. Otherwise unremarkable. Limited intracranial: Age related atrophy.  No acute finding. Visualized orbits: Status post cataract surgery. Otherwise unremarkable. Mastoids and visualized paranasal sinuses: Clear. Skeleton: No fracture. No bone lesion. There are disc degenerative changes in the lower cervical spine. Upper chest: Unremarkable. Other: There is periapical lucency around several of the anterior maxillary teeth. IMPRESSION: 1. Subcutaneous soft tissue edema over the right mandible with no soft tissue mass or evidence of an abscess. No soft tissue masses. No adenopathy. 2. Clear sinuses, mastoid air cells and middle ear cavities. 3. Widely patent airway. No laryngeal or pharyngeal soft tissue swelling. Electronically Signed   By: Amie Portland M.D.   On: 09/09/2016 14:02   Ct Maxillofacial W Contrast  Result Date: 09/09/2016 CLINICAL DATA:  Pt woke with RIGHT facial swelling,redness @ 0200; pt states that her tongue feels swollen, her throat feels tight, but pt denies actual pain EXAM: CT MAXILLOFACIAL WITH CONTRAST CT NECK WITH CONTRAST TECHNIQUE: Contiguous axial images were obtained from the base of the skull through the vertex without and with intravenous contrast. Multidetector CT imaging of the and neck was performed using the standard protocol following the bolus administration of intravenous contrast. CONTRAST:  75mL ISOVUE-300 IOPAMIDOL (ISOVUE-300) INJECTION 61%<Contrast>3mL ISOVUE-300 IOPAMIDOL (ISOVUE-300) INJECTION 61% COMPARISON:   None. FINDINGS: CT MAXILLOFACIAL FINDINGS No fractures. There is subcutaneous edema along the right mandibular region without a defined abscess or mass. Soft tissues are otherwise unremarkable. Lymph nodes:  No adenopathy. Limited intracranial: No acute or significant abnormality. Age related volume loss and vascular calcifications at the skullbase. Globes and orbits: Status post bilateral cataract surgery. Otherwise unremarkable. Sinuses, mastoid air cells and middle ear cavities:  Clear. CT NECK FINDINGS Pharynx and larynx: Normal. No mass or swelling. Salivary glands: No inflammation, mass, or stone. Thyroid: Status post right thyroidectomy. Left thyroid lobe is unremarkable. Lymph nodes: None enlarged or abnormal density. Vascular: Minor carotid vascular calcifications. Otherwise unremarkable. Limited intracranial: Age related atrophy.  No acute finding. Visualized orbits: Status post cataract surgery. Otherwise unremarkable. Mastoids and visualized paranasal sinuses: Clear. Skeleton: No fracture. No bone lesion. There are disc degenerative changes in the lower cervical spine. Upper chest: Unremarkable. Other: There is periapical lucency around several of the anterior maxillary teeth. IMPRESSION: 1. Subcutaneous soft tissue edema over the right mandible  with no soft tissue mass or evidence of an abscess. No soft tissue masses. No adenopathy. 2. Clear sinuses, mastoid air cells and middle ear cavities. 3. Widely patent airway. No laryngeal or pharyngeal soft tissue swelling. Electronically Signed   By: Amie Portlandavid  Ormond M.D.   On: 09/09/2016 14:02   Dg Chest Port 1 View  Result Date: 09/09/2016 CLINICAL DATA:  Pt reports cough x 2-3 days; she reports facial and tongue swelling onset yesterday; she reports h/o COPD, asthma, and HTN; smoker EXAM: PORTABLE CHEST 1 VIEW COMPARISON:  01/30/2016 FINDINGS: Cardiac silhouette is normal in size. The aorta is tortuous. No mediastinal or hilar masses or evidence of  adenopathy. Lungs are clear.  No pleural effusion.  No pneumothorax. Skeletal structures are grossly intact. IMPRESSION: No acute cardiopulmonary disease. Electronically Signed   By: Amie Portlandavid  Ormond M.D.   On: 09/09/2016 11:57    EKG: Independently reviewed. Normal sinus rhythm  Assessment/Plan Principal Problem:   Cellulitis diffuse, face Active Problems:   COPD (chronic obstructive pulmonary disease) (HCC)   Tobacco use   CAD in native artery   Essential hypertension   Thyroid disease   Poor dentition   #1. Cellulitis of the face. Etiology unclear.  May be related to her very poor dentition concern for early abscess. Initially thought to be allergic reaction. Provided with Benadryl and Pepcid. CT shows subcutaneous soft tissue edema over the right mandible with no soft tissue mass or evidence of abscess. Erythema and swelling progresses while in the emergency department includes a right eye. No issues with secretion management or airway maintenance. On admission she's afebrile no leukocytosis nontoxic appearing -Admit to MedSurg for observation -Continue clindamycin IV -Monitor closely -blood cultures if spikes temperature -Likely discharge in a.m. with oral antibiotics if swelling/erythema stable  #2. Hypertension. Poor control in the emergency department. Home medication includes amlodipine. Patient reports she "doesn't take medications any more". Suspect non-compliance -We'll resume amlodipine -monitor BP every 4 hours x3 -will likely need OP monitoring  3. Thyroid disease. Was on Synthroid at one point. States she does not take this medication anymore. -Obtain TSH -Resume home medications -We will need outpatient follow-up  #4. COPD. Not on home oxygen. Continues to smoke. Stable at baseline  #5. CAD. No chest pain. EKG without acute changes.  #6. Tobacco use. -Cessation counseling offered    DVT prophylaxis: lovenox  Code Status: full  Family Communication:  granddaughter at bedside  Disposition Plan: home  Consults called: none  Admission status: obs    Toya SmothersBLACK,Oddie Kuhlmann M MD [facility]  If 7PM-7AM, please contact night-coverage [email or web address] Password [email or web address]  09/09/2016, 4:03 PM

## 2016-09-09 NOTE — ED Triage Notes (Signed)
Pt went to [employer] last night and had crab and lobster (which she has never been allergic to before), pt sts at 9pm son noticed facial swelling and patient refused to go to ER then. When patient awoke this morning right sided facial swelling increased and patient notes tongue swelling. Daughter convinced patient to come to ER to be evaluated. Pt endorses some blurry vision and dental pain  Pt denies chest pain, or shortness of breath.

## 2016-09-10 DIAGNOSIS — I10 Essential (primary) hypertension: Secondary | ICD-10-CM | POA: Diagnosis present

## 2016-09-10 DIAGNOSIS — E890 Postprocedural hypothyroidism: Secondary | ICD-10-CM | POA: Diagnosis present

## 2016-09-10 DIAGNOSIS — K089 Disorder of teeth and supporting structures, unspecified: Secondary | ICD-10-CM | POA: Diagnosis not present

## 2016-09-10 DIAGNOSIS — F17210 Nicotine dependence, cigarettes, uncomplicated: Secondary | ICD-10-CM | POA: Diagnosis present

## 2016-09-10 DIAGNOSIS — Z880 Allergy status to penicillin: Secondary | ICD-10-CM | POA: Diagnosis not present

## 2016-09-10 DIAGNOSIS — I252 Old myocardial infarction: Secondary | ICD-10-CM | POA: Diagnosis not present

## 2016-09-10 DIAGNOSIS — I2510 Atherosclerotic heart disease of native coronary artery without angina pectoris: Secondary | ICD-10-CM | POA: Diagnosis present

## 2016-09-10 DIAGNOSIS — J449 Chronic obstructive pulmonary disease, unspecified: Secondary | ICD-10-CM | POA: Diagnosis present

## 2016-09-10 DIAGNOSIS — Z91018 Allergy to other foods: Secondary | ICD-10-CM | POA: Diagnosis not present

## 2016-09-10 DIAGNOSIS — H269 Unspecified cataract: Secondary | ICD-10-CM | POA: Diagnosis present

## 2016-09-10 DIAGNOSIS — Z8249 Family history of ischemic heart disease and other diseases of the circulatory system: Secondary | ICD-10-CM | POA: Diagnosis not present

## 2016-09-10 DIAGNOSIS — L03211 Cellulitis of face: Secondary | ICD-10-CM | POA: Diagnosis present

## 2016-09-10 DIAGNOSIS — Z882 Allergy status to sulfonamides status: Secondary | ICD-10-CM | POA: Diagnosis not present

## 2016-09-10 DIAGNOSIS — F329 Major depressive disorder, single episode, unspecified: Secondary | ICD-10-CM | POA: Diagnosis present

## 2016-09-10 DIAGNOSIS — Z79899 Other long term (current) drug therapy: Secondary | ICD-10-CM | POA: Diagnosis not present

## 2016-09-10 DIAGNOSIS — R1310 Dysphagia, unspecified: Secondary | ICD-10-CM | POA: Diagnosis present

## 2016-09-10 LAB — CBC
HCT: 37.4 % (ref 36.0–46.0)
Hemoglobin: 12.2 g/dL (ref 12.0–15.0)
MCH: 29.9 pg (ref 26.0–34.0)
MCHC: 32.6 g/dL (ref 30.0–36.0)
MCV: 91.7 fL (ref 78.0–100.0)
Platelets: 231 10*3/uL (ref 150–400)
RBC: 4.08 MIL/uL (ref 3.87–5.11)
RDW: 13.6 % (ref 11.5–15.5)
WBC: 8.2 10*3/uL (ref 4.0–10.5)

## 2016-09-10 MED ORDER — SODIUM CHLORIDE 0.9 % IV SOLN
3.0000 g | Freq: Three times a day (TID) | INTRAVENOUS | Status: DC
  Administered 2016-09-10 – 2016-09-11 (×3): 3 g via INTRAVENOUS
  Filled 2016-09-10 (×7): qty 3

## 2016-09-10 NOTE — Progress Notes (Signed)
PROGRESS NOTE  Phyllis Caldwell  ZOX:[id number] DOB: [birth year] DOA: 09/09/2016 PCP: Emeterio Reeve, MD Outpatient Specialists:  Subjective: Feels okay, the only complaint she had is why she is on heart healthy diet. Denies any pain or fever or chills.  Brief Narrative:  Phyllis Caldwell is a pleasant  75 y.o. female with medical history significant for CAD, hypertension, thyroid disease, tobacco use, COPD not on home oxygen, reported dentition presents emergency Department chief complaint sudden onset of facial swelling erythema. Initial evaluation reveals facial cellulitis via CT and some concern for early abscess.  Information is obtained from the patient. She states she was in her usual state of health until about 2 AM she awakened to go to the restroom and family member noted that her face was swollen and red. She denies any pain itching difficulty breathing. She states she went back to bed. She awakened this morning with worsening edema and swelling on right side of face extending up into the right. This point time she states she thought her tongue felt thick and she had mild difficulty swallowing. She denies any difficulty breathing. She denies pain fever chills. She does report her right cheek is tender to palpation. She ate at [employer] the night before but has done so several times and doubts allergic reaction. Also states that her right canine to "shattered" She does not take an ACE inhibitor. Associated symptoms include recent productive cough with a quite sputum and intermittent chest tightness. She states tightness located left anterior nonradiating like when she "has an asthma attack". She denies abdominal pain nausea vomiting fever chills recent travel or sick contacts. Denies dysuria hematuria frequency or urgency. She denies diarrhea constipation melena or bright red blood per rectum.    Assessment & Plan:   Principal Problem:   Cellulitis diffuse, face Active Problems:  COPD (chronic obstructive pulmonary disease) (HCC)   Tobacco use   CAD in native artery   Essential hypertension   Thyroid disease   Poor dentition   Cellulitis of the face. Etiology unclear.  May be related to her very poor dentition concern for early abscess. Initially thought to be allergic reaction. Provided with Benadryl and Pepcid. CT shows subcutaneous soft tissue edema over the right mandible with no soft tissue mass or evidence of abscess. Erythema and swelling progresses while in the emergency department includes a right eye. No issues with secretion management or airway maintenance. On admission she's afebrile no leukocytosis nontoxic appearing -Admit to MedSurg for observation -Started on IV clindamycin, will switch to IV Unasyn so it can be switched to Augmentin on discharge. -He still has redness in her right cheek under the right eye, continue IV antibiotics. -Cellulitis or redness involving danger zone of the face, will keep on IV antibiotics for today.  Hypertension. Poor control in the emergency department. Home medication includes amlodipine. Patient reports she "doesn't take medications any more". Suspect non-compliance -We'll resume amlodipine -monitor BP every 4 hours x3 -will likely need OP monitoring  Thyroid disease. Was on Synthroid at one point. States she does not take this medication anymore. -Obtain TSH -Resume home medications -We will need outpatient follow-up  COPD. Not on home oxygen. Continues to smoke. Stable at baseline  CAD. No chest pain. EKG without acute changes.  Tobacco use. -Cessation counseling offered    DVT prophylaxis: Lovenox Code Status: Full Code Family Communication:  Disposition Plan:  Diet: Diet Heart Room service appropriate? Yes; Fluid consistency: Thin  Consultants:   None  Procedures:   None  Antimicrobials:   Clindamycin, will change to Unasyn   Objective: Vitals:   09/09/16 1515 09/09/16 1657  09/09/16 2140 09/10/16 0544  BP: 166/94 (!) 142/78 (!) 142/83 (!) 145/87  Pulse: (!) 55 (!) 56 63 (!) 57  Resp: 18  16 16   Temp:  97.8 F (36.6 C) 98.7 F (37.1 C) 98.4 F (36.9 C)  TempSrc:  Oral Oral Oral  SpO2: 98% 98% 98% 95%  Weight:      Height:        Intake/Output Summary (Last 24 hours) at 09/10/16 1023 Last data filed at 09/09/16 1542  Gross per 24 hour  Intake               50 ml  Output                0 ml  Net               50 ml   Filed Weights   09/09/16 1118  Weight: 68.9 kg (152 lb)    Examination: General exam: Appears calm and comfortable  Respiratory system: Clear to auscultation. Respiratory effort normal. Cardiovascular system: S1 & S2 heard, RRR. No JVD, murmurs, rubs, gallops or clicks. No pedal edema. Gastrointestinal system: Abdomen is nondistended, soft and nontender. No organomegaly or masses felt. Normal bowel sounds heard. Central nervous system: Alert and oriented. No focal neurological deficits. Extremities: Symmetric 5 x 5 power. Skin: No rashes, lesions or ulcers Psychiatry: Judgement and insight appear normal. Mood & affect appropriate.   Data Reviewed: I have personally reviewed following labs and imaging studies  CBC:  Recent Labs Lab 09/09/16 1129 09/10/16 0444  WBC 9.0 8.2  NEUTROABS 6.0  --   HGB 14.6 12.2  HCT 44.1 37.4  MCV 91.7 91.7  PLT 281 231   Basic Metabolic Panel:  Recent Labs Lab 09/09/16 1129  NA 137  K 4.0  CL 104  CO2 25  GLUCOSE 97  BUN 10  CREATININE 0.87  CALCIUM 9.9   GFR: Estimated Creatinine Clearance: 59.3 mL/min (by C-G formula based on SCr of 0.87 mg/dL). Liver Function Tests: No results for input(s): AST, ALT, ALKPHOS, BILITOT, PROT, ALBUMIN in the last 168 hours. No results for input(s): LIPASE, AMYLASE in the last 168 hours. No results for input(s): AMMONIA in the last 168 hours. Coagulation Profile: No results for input(s): INR, PROTIME in the last 168 hours. Cardiac Enzymes: No  results for input(s): CKTOTAL, CKMB, CKMBINDEX, TROPONINI in the last 168 hours. BNP (last 3 results) No results for input(s): PROBNP in the last 8760 hours. HbA1C: No results for input(s): HGBA1C in the last 72 hours. CBG: No results for input(s): GLUCAP in the last 168 hours. Lipid Profile:  Recent Labs  09/09/16 1635  CHOL 187  HDL 47  LDLCALC 121*  TRIG 93  CHOLHDL 4.0   Thyroid Function Tests:  Recent Labs  09/09/16 1635  TSH 2.982   Anemia Panel: No results for input(s): VITAMINB12, FOLATE, FERRITIN, TIBC, IRON, RETICCTPCT in the last 72 hours. Urine analysis: No results found for: COLORURINE, APPEARANCEUR, LABSPEC, PHURINE, GLUCOSEU, HGBUR, BILIRUBINUR, KETONESUR, PROTEINUR, UROBILINOGEN, NITRITE, LEUKOCYTESUR Sepsis Labs: @LABRCNTIP (procalcitonin:4,lacticidven:4)  )No results found for this or any previous visit (from the past 240 hour(s)).   Invalid input(s): PROCALCITONIN, LACTICACIDVEN   Radiology Studies: Ct Soft Tissue Neck W Contrast  Result Date: 09/09/2016 CLINICAL DATA:  Pt woke with RIGHT facial swelling,redness @ 0200; pt states that her tongue  feels swollen, her throat feels tight, but pt denies actual pain EXAM: CT MAXILLOFACIAL WITH CONTRAST CT NECK WITH CONTRAST TECHNIQUE: Contiguous axial images were obtained from the base of the skull through the vertex without and with intravenous contrast. Multidetector CT imaging of the and neck was performed using the standard protocol following the bolus administration of intravenous contrast. CONTRAST:  75mL ISOVUE-300 IOPAMIDOL (ISOVUE-300) INJECTION 61%<Contrast>85mL ISOVUE-300 IOPAMIDOL (ISOVUE-300) INJECTION 61% COMPARISON:  None. FINDINGS: CT MAXILLOFACIAL FINDINGS No fractures. There is subcutaneous edema along the right mandibular region without a defined abscess or mass. Soft tissues are otherwise unremarkable. Lymph nodes:  No adenopathy. Limited intracranial: No acute or significant abnormality. Age  related volume loss and vascular calcifications at the skullbase. Globes and orbits: Status post bilateral cataract surgery. Otherwise unremarkable. Sinuses, mastoid air cells and middle ear cavities:  Clear. CT NECK FINDINGS Pharynx and larynx: Normal. No mass or swelling. Salivary glands: No inflammation, mass, or stone. Thyroid: Status post right thyroidectomy. Left thyroid lobe is unremarkable. Lymph nodes: None enlarged or abnormal density. Vascular: Minor carotid vascular calcifications. Otherwise unremarkable. Limited intracranial: Age related atrophy.  No acute finding. Visualized orbits: Status post cataract surgery. Otherwise unremarkable. Mastoids and visualized paranasal sinuses: Clear. Skeleton: No fracture. No bone lesion. There are disc degenerative changes in the lower cervical spine. Upper chest: Unremarkable. Other: There is periapical lucency around several of the anterior maxillary teeth. IMPRESSION: 1. Subcutaneous soft tissue edema over the right mandible with no soft tissue mass or evidence of an abscess. No soft tissue masses. No adenopathy. 2. Clear sinuses, mastoid air cells and middle ear cavities. 3. Widely patent airway. No laryngeal or pharyngeal soft tissue swelling. Electronically Signed   By: Amie Portland M.D.   On: 09/09/2016 14:02   Ct Maxillofacial W Contrast  Result Date: 09/09/2016 CLINICAL DATA:  Pt woke with RIGHT facial swelling,redness @ 0200; pt states that her tongue feels swollen, her throat feels tight, but pt denies actual pain EXAM: CT MAXILLOFACIAL WITH CONTRAST CT NECK WITH CONTRAST TECHNIQUE: Contiguous axial images were obtained from the base of the skull through the vertex without and with intravenous contrast. Multidetector CT imaging of the and neck was performed using the standard protocol following the bolus administration of intravenous contrast. CONTRAST:  75mL ISOVUE-300 IOPAMIDOL (ISOVUE-300) INJECTION 61%<Contrast>7mL ISOVUE-300 IOPAMIDOL  (ISOVUE-300) INJECTION 61% COMPARISON:  None. FINDINGS: CT MAXILLOFACIAL FINDINGS No fractures. There is subcutaneous edema along the right mandibular region without a defined abscess or mass. Soft tissues are otherwise unremarkable. Lymph nodes:  No adenopathy. Limited intracranial: No acute or significant abnormality. Age related volume loss and vascular calcifications at the skullbase. Globes and orbits: Status post bilateral cataract surgery. Otherwise unremarkable. Sinuses, mastoid air cells and middle ear cavities:  Clear. CT NECK FINDINGS Pharynx and larynx: Normal. No mass or swelling. Salivary glands: No inflammation, mass, or stone. Thyroid: Status post right thyroidectomy. Left thyroid lobe is unremarkable. Lymph nodes: None enlarged or abnormal density. Vascular: Minor carotid vascular calcifications. Otherwise unremarkable. Limited intracranial: Age related atrophy.  No acute finding. Visualized orbits: Status post cataract surgery. Otherwise unremarkable. Mastoids and visualized paranasal sinuses: Clear. Skeleton: No fracture. No bone lesion. There are disc degenerative changes in the lower cervical spine. Upper chest: Unremarkable. Other: There is periapical lucency around several of the anterior maxillary teeth. IMPRESSION: 1. Subcutaneous soft tissue edema over the right mandible with no soft tissue mass or evidence of an abscess. No soft tissue masses. No adenopathy. 2. Clear sinuses, mastoid air  cells and middle ear cavities. 3. Widely patent airway. No laryngeal or pharyngeal soft tissue swelling. Electronically Signed   By: Amie Portland M.D.   On: 09/09/2016 14:02   Dg Chest Port 1 View  Result Date: 09/09/2016 CLINICAL DATA:  Pt reports cough x 2-3 days; she reports facial and tongue swelling onset yesterday; she reports h/o COPD, asthma, and HTN; smoker EXAM: PORTABLE CHEST 1 VIEW COMPARISON:  01/30/2016 FINDINGS: Cardiac silhouette is normal in size. The aorta is tortuous. No mediastinal  or hilar masses or evidence of adenopathy. Lungs are clear.  No pleural effusion.  No pneumothorax. Skeletal structures are grossly intact. IMPRESSION: No acute cardiopulmonary disease. Electronically Signed   By: Amie Portland M.D.   On: 09/09/2016 11:57        Scheduled Meds: . amLODipine  5 mg Oral Daily  . atorvastatin  20 mg Oral q1800  . clindamycin (CLEOCIN) IV  600 mg Intravenous Q12H  . enoxaparin (LOVENOX) injection  40 mg Subcutaneous Q24H  . levothyroxine  50 mcg Oral QAC breakfast  . pantoprazole  40 mg Oral Q1200   Continuous Infusions:    LOS: 0 days    Time spent: 35 minutes    Christana Angelica A, MD [facility] Pager [phone]  If 7PM-7AM, please contact night-coverage [email or web address] Password [email or web address] 09/10/2016, 10:23 AM

## 2016-09-10 NOTE — Progress Notes (Signed)
Pharmacy Antibiotic Note  Phyllis BucklerSharon L Blumer is a 75 y.o. female admitted on 09/09/2016 with cellulitis related to facial swelling and redness.  Pharmacy has been consulted for unasyn dosing. -WBC WNL, afebrile  -SCr 0.87, CrCl ~60 -Patient does have allergy to penicillins listed but cannot remember the reaction. I spoke with patient and counseled her on what to look for and to let the nurses and doctors know if she thinks she is having an allergic reaction   Plan: -Unasyn 3g Q8H  -Monitor renal function, patient progress, LOT  Height: 5\' 9"  (175.3 cm) Weight: 152 lb (68.9 kg) IBW/kg (Calculated) : 66.2  Temp (24hrs), Avg:98.3 F (36.8 C), Min:97.8 F (36.6 C), Max:98.7 F (37.1 C)   Recent Labs Lab 09/09/16 1129 09/10/16 0444  WBC 9.0 8.2  CREATININE 0.87  --     Estimated Creatinine Clearance: 59.3 mL/min (by C-G formula based on SCr of 0.87 mg/dL).    Allergies  Allergen Reactions  . Coconut Fatty Acids     unknown  . Codeine Nausea And Vomiting  . Penicillins     Patient doesn't remember  . Sulfa Antibiotics     unknown    Antimicrobials this admission: Unasyn 10/1 >>  Dose adjustments this admission: n/a  Microbiology results: None collected so far  Thank you for allowing pharmacy to be a part of this patient's care.  Gwyndolyn KaufmanKai Fortino Haag Bernette Redbird(Kenny), PharmD  [id number] Pharmacy Resident Pager: [phone] 09/10/2016 10:56 AM

## 2016-09-10 NOTE — Care Management Obs Status (Signed)
MEDICARE OBSERVATION STATUS NOTIFICATION   Patient Details  Name: Phyllis Caldwell MRN: [id number] Date of Birth: 11/14/1941   Medicare Observation Status Notification Given:  Yes    Darcel Smallingnna C Tryphena Perkovich, RN 09/10/2016, 5:00 PM

## 2016-09-11 LAB — BASIC METABOLIC PANEL
Anion gap: 6 (ref 5–15)
BUN: 18 mg/dL (ref 6–20)
CO2: 27 mmol/L (ref 22–32)
Calcium: 9.1 mg/dL (ref 8.9–10.3)
Chloride: 107 mmol/L (ref 101–111)
Creatinine, Ser: 1.04 mg/dL — ABNORMAL HIGH (ref 0.44–1.00)
GFR calc Af Amer: 60 mL/min — ABNORMAL LOW (ref >60)
GFR calc non Af Amer: 52 mL/min — ABNORMAL LOW (ref >60)
Glucose, Bld: 111 mg/dL — ABNORMAL HIGH (ref 65–99)
Potassium: 3.7 mmol/L (ref 3.5–5.1)
Sodium: 140 mmol/L (ref 135–145)

## 2016-09-11 LAB — CBC
HCT: 36.7 % (ref 36.0–46.0)
Hemoglobin: 11.9 g/dL — ABNORMAL LOW (ref 12.0–15.0)
MCH: 30.1 pg (ref 26.0–34.0)
MCHC: 32.4 g/dL (ref 30.0–36.0)
MCV: 92.7 fL (ref 78.0–100.0)
Platelets: 236 10*3/uL (ref 150–400)
RBC: 3.96 MIL/uL (ref 3.87–5.11)
RDW: 13.6 % (ref 11.5–15.5)
WBC: 6.6 10*3/uL (ref 4.0–10.5)

## 2016-09-11 MED ORDER — AMLODIPINE BESYLATE 5 MG PO TABS: 5.0000 mg | ORAL_TABLET | Freq: Every day | ORAL | 0 refills | Status: DC

## 2016-09-11 MED ORDER — LEVOTHYROXINE SODIUM 50 MCG PO TABS: 50.0000 ug | ORAL_TABLET | Freq: Every day | ORAL | 0 refills | Status: AC

## 2016-09-11 MED ORDER — PANTOPRAZOLE SODIUM 40 MG PO TBEC: 40.0000 mg | DELAYED_RELEASE_TABLET | Freq: Every day | ORAL | 3 refills | Status: AC

## 2016-09-11 MED ORDER — AMOXICILLIN-POT CLAVULANATE 875-125 MG PO TABS: 1.0000 | ORAL_TABLET | Freq: Two times a day (BID) | ORAL | 0 refills | Status: DC

## 2016-09-11 MED ORDER — ATORVASTATIN CALCIUM 20 MG PO TABS: 20.0000 mg | ORAL_TABLET | Freq: Every day | ORAL | 3 refills | Status: DC

## 2016-09-11 NOTE — Discharge Summary (Signed)
Physician Discharge Summary  Phyllis Caldwell:[id number] DOB: [birth year] DOA: 09/09/2016  PCP: Emeterio Reeve, MD  Admit date: 09/09/2016 Discharge date: 09/11/2016  Admitted From: Home Disposition: Home   Recommendations for Outpatient Follow-up:  1. Follow up with PCP in 1-2 weeks 2. Please obtain BMP/CBC in one week 3. Continue Augmentin for 7 more days.  Home Health: N/A Equipment/Devices: N/A  Discharge Condition: Stable CODE STATUS: Full Diet recommendation: Heart Healthy  Brief/Interim Summary: Phyllis Caldwell is a pleasant  75 y.o. female with medical history significant for CAD, hypertension, thyroid disease, tobacco use, COPD not on home oxygen, reported dentition presents emergency Department chief complaint sudden onset of facial swelling erythema. Initial evaluation reveals facial cellulitis via CT and some concern for early abscess.  Information is obtained from the patient. She states she was in her usual state of health until about 2 AM she awakened to go to the restroom and family member noted that her face was swollen and red. She denies any pain itching difficulty breathing. She states she went back to bed. She awakened this morning with worsening edema and swelling on right side of face extending up into the right. This point time she states she thought her tongue felt thick and she had mild difficulty swallowing. She denies any difficulty breathing. She denies pain fever chills. She does report her right cheek is tender to palpation. She ate at [employer] the night before but has done so several times and doubts allergic reaction. Also states that her right canine to "shattered" She does not take an ACE inhibitor. Associated symptoms include recent productive cough with a quite sputum and intermittent chest tightness. She states tightness located left anterior nonradiating like when she "has an asthma attack". She denies abdominal pain nausea vomiting fever chills  recent travel or sick contacts. Denies dysuria hematuria frequency or urgency. She denies diarrhea constipation melena or bright red blood per rectum.  Discharge Diagnoses:  Principal Problem:   Cellulitis diffuse, face Active Problems:   COPD (chronic obstructive pulmonary disease) (HCC)   Tobacco use   CAD in native artery   Essential hypertension   Thyroid disease   Poor dentition   Cellulitis of the face -May berelated to her very poor dentition concern for early abscess.Initially thought to be allergic reaction.  -Provided with Benadryl and Pepcid.  -CT shows subcutaneous soft tissue edema over the right mandible with no soft tissue mass or evidence of abscess. -Erythema and swelling progresses while in the emergency department includes a right eye.  -Did not have any respiratory tract compromise. -Cellulitis or redness involving danger zone of the face, placed on Unasyn since admission. -Discharged on Augmentin for 7 more days, follow-up closely with primary care physician.  Hypertension -Poor control in the emergency department. Home medication includes amlodipine.  -Patient reports she "doesn't take medications any more". Suspect non-compliance -Her amlodipine resumed, and prescribed on discharge.  Thyroid disease. Was on Synthroid at one point. States she does not take this medication anymore. -TSH is appropriate at 2.98, resumed Synthroid at same dose.  COPD. Not on home oxygen. Continues to smoke. Stable at baseline  CAD. No chest pain. EKG without acute changes.  Tobacco use. -Cessation counseling offered   Discharge Instructions  Discharge Instructions    Diet - low sodium heart healthy    Complete by:  As directed    Increase activity slowly    Complete by:  As directed  Medication List    TAKE these medications   acetaminophen 325 MG tablet Commonly known as:  TYLENOL Take 650 mg by mouth every 6 (six) hours as needed for mild pain.    amLODipine 5 MG tablet Commonly known as:  NORVASC Take 1 tablet (5 mg total) by mouth daily. Reported on 01/05/2016 What changed:  how much to take  how to take this  when to take this   amoxicillin-clavulanate 875-125 MG tablet Commonly known as:  AUGMENTIN Take 1 tablet by mouth 2 (two) times daily.   atorvastatin 20 MG tablet Commonly known as:  LIPITOR Take 1 tablet (20 mg total) by mouth daily at 6 PM.   levothyroxine 50 MCG tablet Commonly known as:  SYNTHROID, LEVOTHROID Take 1 tablet (50 mcg total) by mouth daily before breakfast. Reported on 01/05/2016 What changed:  when to take this   pantoprazole 40 MG tablet Commonly known as:  PROTONIX Take 1 tablet (40 mg total) by mouth daily at 12 noon.       Allergies  Allergen Reactions  . Coconut Fatty Acids     unknown  . Codeine Nausea And Vomiting  . Penicillins     Patient doesn't remember  . Sulfa Antibiotics     unknown    Consultations:  None  Procedures/Studies: Ct Soft Tissue Neck W Contrast  Result Date: 09/09/2016 CLINICAL DATA:  Pt woke with RIGHT facial swelling,redness @ 0200; pt states that her tongue feels swollen, her throat feels tight, but pt denies actual pain EXAM: CT MAXILLOFACIAL WITH CONTRAST CT NECK WITH CONTRAST TECHNIQUE: Contiguous axial images were obtained from the base of the skull through the vertex without and with intravenous contrast. Multidetector CT imaging of the and neck was performed using the standard protocol following the bolus administration of intravenous contrast. CONTRAST:  75mL ISOVUE-300 IOPAMIDOL (ISOVUE-300) INJECTION 61%<Contrast>4275mL ISOVUE-300 IOPAMIDOL (ISOVUE-300) INJECTION 61% COMPARISON:  None. FINDINGS: CT MAXILLOFACIAL FINDINGS No fractures. There is subcutaneous edema along the right mandibular region without a defined abscess or mass. Soft tissues are otherwise unremarkable. Lymph nodes:  No adenopathy. Limited intracranial: No acute or significant  abnormality. Age related volume loss and vascular calcifications at the skullbase. Globes and orbits: Status post bilateral cataract surgery. Otherwise unremarkable. Sinuses, mastoid air cells and middle ear cavities:  Clear. CT NECK FINDINGS Pharynx and larynx: Normal. No mass or swelling. Salivary glands: No inflammation, mass, or stone. Thyroid: Status post right thyroidectomy. Left thyroid lobe is unremarkable. Lymph nodes: None enlarged or abnormal density. Vascular: Minor carotid vascular calcifications. Otherwise unremarkable. Limited intracranial: Age related atrophy.  No acute finding. Visualized orbits: Status post cataract surgery. Otherwise unremarkable. Mastoids and visualized paranasal sinuses: Clear. Skeleton: No fracture. No bone lesion. There are disc degenerative changes in the lower cervical spine. Upper chest: Unremarkable. Other: There is periapical lucency around several of the anterior maxillary teeth. IMPRESSION: 1. Subcutaneous soft tissue edema over the right mandible with no soft tissue mass or evidence of an abscess. No soft tissue masses. No adenopathy. 2. Clear sinuses, mastoid air cells and middle ear cavities. 3. Widely patent airway. No laryngeal or pharyngeal soft tissue swelling. Electronically Signed   By: Amie Portlandavid  Ormond M.D.   On: 09/09/2016 14:02   Ct Maxillofacial W Contrast  Result Date: 09/09/2016 CLINICAL DATA:  Pt woke with RIGHT facial swelling,redness @ 0200; pt states that her tongue feels swollen, her throat feels tight, but pt denies actual pain EXAM: CT MAXILLOFACIAL WITH CONTRAST CT NECK WITH CONTRAST  TECHNIQUE: Contiguous axial images were obtained from the base of the skull through the vertex without and with intravenous contrast. Multidetector CT imaging of the and neck was performed using the standard protocol following the bolus administration of intravenous contrast. CONTRAST:  75mL ISOVUE-300 IOPAMIDOL (ISOVUE-300) INJECTION 61%<Contrast>50mL ISOVUE-300  IOPAMIDOL (ISOVUE-300) INJECTION 61% COMPARISON:  None. FINDINGS: CT MAXILLOFACIAL FINDINGS No fractures. There is subcutaneous edema along the right mandibular region without a defined abscess or mass. Soft tissues are otherwise unremarkable. Lymph nodes:  No adenopathy. Limited intracranial: No acute or significant abnormality. Age related volume loss and vascular calcifications at the skullbase. Globes and orbits: Status post bilateral cataract surgery. Otherwise unremarkable. Sinuses, mastoid air cells and middle ear cavities:  Clear. CT NECK FINDINGS Pharynx and larynx: Normal. No mass or swelling. Salivary glands: No inflammation, mass, or stone. Thyroid: Status post right thyroidectomy. Left thyroid lobe is unremarkable. Lymph nodes: None enlarged or abnormal density. Vascular: Minor carotid vascular calcifications. Otherwise unremarkable. Limited intracranial: Age related atrophy.  No acute finding. Visualized orbits: Status post cataract surgery. Otherwise unremarkable. Mastoids and visualized paranasal sinuses: Clear. Skeleton: No fracture. No bone lesion. There are disc degenerative changes in the lower cervical spine. Upper chest: Unremarkable. Other: There is periapical lucency around several of the anterior maxillary teeth. IMPRESSION: 1. Subcutaneous soft tissue edema over the right mandible with no soft tissue mass or evidence of an abscess. No soft tissue masses. No adenopathy. 2. Clear sinuses, mastoid air cells and middle ear cavities. 3. Widely patent airway. No laryngeal or pharyngeal soft tissue swelling. Electronically Signed   By: Amie Portland M.D.   On: 09/09/2016 14:02   Dg Chest Port 1 View  Result Date: 09/09/2016 CLINICAL DATA:  Pt reports cough x 2-3 days; she reports facial and tongue swelling onset yesterday; she reports h/o COPD, asthma, and HTN; smoker EXAM: PORTABLE CHEST 1 VIEW COMPARISON:  01/30/2016 FINDINGS: Cardiac silhouette is normal in size. The aorta is tortuous. No  mediastinal or hilar masses or evidence of adenopathy. Lungs are clear.  No pleural effusion.  No pneumothorax. Skeletal structures are grossly intact. IMPRESSION: No acute cardiopulmonary disease. Electronically Signed   By: Amie Portland M.D.   On: 09/09/2016 11:57    (Echo, Carotid, EGD, Colonoscopy, ERCP)    Subjective:   Discharge Exam: Vitals:   09/10/16 2130 09/11/16 0539  BP: (!) 154/90 (!) 177/83  Pulse: 65 (!) 55  Resp: 16 16  Temp: 98 F (36.7 C) 98 F (36.7 C)   Vitals:   09/10/16 0544 09/10/16 1432 09/10/16 2130 09/11/16 0539  BP: (!) 145/87 (!) 144/78 (!) 154/90 (!) 177/83  Pulse: (!) 57 (!) 58 65 (!) 55  Resp: 16  16 16   Temp: 98.4 F (36.9 C) 98.6 F (37 C) 98 F (36.7 C) 98 F (36.7 C)  TempSrc: Oral Oral Oral Oral  SpO2: 95% 99% 100% 98%  Weight:      Height:        General: Pt is alert, awake, not in acute distress Cardiovascular: RRR, S1/S2 +, no rubs, no gallops Respiratory: CTA bilaterally, no wheezing, no rhonchi Abdominal: Soft, NT, ND, bowel sounds + Extremities: no edema, no cyanosis    The results of significant diagnostics from this hospitalization (including imaging, microbiology, ancillary and laboratory) are listed below for reference.     Microbiology: No results found for this or any previous visit (from the past 240 hour(s)).   Labs: BNP (last 3 results) No results for input(s):  BNP in the last 8760 hours. Basic Metabolic Panel:  Recent Labs Lab 09/09/16 1129 09/11/16 0236  NA 137 140  K 4.0 3.7  CL 104 107  CO2 25 27  GLUCOSE 97 111*  BUN 10 18  CREATININE 0.87 1.04*  CALCIUM 9.9 9.1   Liver Function Tests: No results for input(s): AST, ALT, ALKPHOS, BILITOT, PROT, ALBUMIN in the last 168 hours. No results for input(s): LIPASE, AMYLASE in the last 168 hours. No results for input(s): AMMONIA in the last 168 hours. CBC:  Recent Labs Lab 09/09/16 1129 09/10/16 0444 09/11/16 0236  WBC 9.0 8.2 6.6  NEUTROABS  6.0  --   --   HGB 14.6 12.2 11.9*  HCT 44.1 37.4 36.7  MCV 91.7 91.7 92.7  PLT 281 231 236   Cardiac Enzymes: No results for input(s): CKTOTAL, CKMB, CKMBINDEX, TROPONINI in the last 168 hours. BNP: Invalid input(s): POCBNP CBG: No results for input(s): GLUCAP in the last 168 hours. D-Dimer No results for input(s): DDIMER in the last 72 hours. Hgb A1c No results for input(s): HGBA1C in the last 72 hours. Lipid Profile  Recent Labs  09/09/16 1635  CHOL 187  HDL 47  LDLCALC 121*  TRIG 93  CHOLHDL 4.0   Thyroid function studies  Recent Labs  09/09/16 1635  TSH 2.982   Anemia work up No results for input(s): VITAMINB12, FOLATE, FERRITIN, TIBC, IRON, RETICCTPCT in the last 72 hours. Urinalysis No results found for: COLORURINE, APPEARANCEUR, LABSPEC, PHURINE, GLUCOSEU, HGBUR, BILIRUBINUR, KETONESUR, PROTEINUR, UROBILINOGEN, NITRITE, LEUKOCYTESUR Sepsis Labs Invalid input(s): PROCALCITONIN,  WBC,  LACTICIDVEN Microbiology No results found for this or any previous visit (from the past 240 hour(s)).   Time coordinating discharge: Over 30 minutes  SIGNED:   Clint Lipps, MD  [facility] 09/11/2016, 10:17 AM Pager   If 7PM-7AM, please contact night-coverage [email or web address] Password [email or web address]

## 2016-09-11 NOTE — Progress Notes (Signed)
Patient in a stable condition, patient refused scheduled IV ABT, patient education completed this RN completed discharge education ,patient verbalised understanding, paper prescriptions given to patient, patient belongings at bedside, iv removed, patient offered a wheelchair but patient refused and choose to ambulate off the unit.

## 2016-09-14 DIAGNOSIS — R51 Headache: Secondary | ICD-10-CM | POA: Diagnosis not present

## 2016-09-14 DIAGNOSIS — L03211 Cellulitis of face: Secondary | ICD-10-CM | POA: Diagnosis not present

## 2016-09-14 DIAGNOSIS — M129 Arthropathy, unspecified: Secondary | ICD-10-CM | POA: Diagnosis not present

## 2016-09-14 DIAGNOSIS — R42 Dizziness and giddiness: Secondary | ICD-10-CM | POA: Diagnosis not present

## 2016-12-21 DIAGNOSIS — N390 Urinary tract infection, site not specified: Secondary | ICD-10-CM | POA: Diagnosis not present

## 2016-12-21 DIAGNOSIS — R399 Unspecified symptoms and signs involving the genitourinary system: Secondary | ICD-10-CM | POA: Diagnosis not present

## 2016-12-21 DIAGNOSIS — S8000XA Contusion of unspecified knee, initial encounter: Secondary | ICD-10-CM | POA: Diagnosis not present

## 2016-12-21 DIAGNOSIS — M129 Arthropathy, unspecified: Secondary | ICD-10-CM | POA: Diagnosis not present

## 2017-03-05 DIAGNOSIS — E039 Hypothyroidism, unspecified: Secondary | ICD-10-CM | POA: Diagnosis not present

## 2017-03-05 DIAGNOSIS — N390 Urinary tract infection, site not specified: Secondary | ICD-10-CM | POA: Diagnosis not present

## 2017-03-05 DIAGNOSIS — R42 Dizziness and giddiness: Secondary | ICD-10-CM | POA: Diagnosis not present

## 2017-03-05 DIAGNOSIS — Z79899 Other long term (current) drug therapy: Secondary | ICD-10-CM | POA: Diagnosis not present

## 2017-03-05 DIAGNOSIS — I2510 Atherosclerotic heart disease of native coronary artery without angina pectoris: Secondary | ICD-10-CM | POA: Diagnosis not present

## 2017-03-05 DIAGNOSIS — R0602 Shortness of breath: Secondary | ICD-10-CM | POA: Diagnosis not present

## 2017-03-07 ENCOUNTER — Telehealth: Payer: Self-pay | Admitting: *Deleted

## 2017-03-07 NOTE — Telephone Encounter (Signed)
NOTES SENT TO SCHEDULING.  °

## 2017-03-16 ENCOUNTER — Encounter: Payer: Self-pay | Admitting: Nurse Practitioner

## 2017-03-19 ENCOUNTER — Encounter: Payer: Self-pay | Admitting: Nurse Practitioner

## 2017-03-19 ENCOUNTER — Ambulatory Visit (INDEPENDENT_AMBULATORY_CARE_PROVIDER_SITE_OTHER): Payer: Medicare HMO | Admitting: Nurse Practitioner

## 2017-03-19 ENCOUNTER — Encounter (INDEPENDENT_AMBULATORY_CARE_PROVIDER_SITE_OTHER): Payer: Self-pay

## 2017-03-19 VITALS — BP 130/90 | HR 64 | Ht 69.0 in | Wt 149.0 lb

## 2017-03-19 DIAGNOSIS — I2510 Atherosclerotic heart disease of native coronary artery without angina pectoris: Secondary | ICD-10-CM | POA: Diagnosis not present

## 2017-03-19 DIAGNOSIS — R0602 Shortness of breath: Secondary | ICD-10-CM

## 2017-03-19 DIAGNOSIS — Z720 Tobacco use: Secondary | ICD-10-CM

## 2017-03-19 MED ORDER — ROSUVASTATIN CALCIUM 10 MG PO TABS: 10.0000 mg | ORAL_TABLET | Freq: Every day | ORAL | 3 refills | Status: DC

## 2017-03-19 NOTE — Patient Instructions (Addendum)
We will be checking the following labs today - NONE  BMET/lipids and LFTs in 4 to 6 weeks.   Medication Instructions:    Continue with your current medicines. BUT  Will stop the Lipitor  Try Crestor 10 mg a day - this has been sent to your pharmacy    Testing/Procedures To Be Arranged:  N/A  Follow-Up:   See Dr. Katrinka Blazing in one year.     Other Special Instructions:   Keep trying to stop smoking.   Try to take your medicines regularly.     If you need a refill on your cardiac medications before your next appointment, please call your pharmacy.   Call the [facility] office at [phone] if you have any questions, problems or concerns.

## 2017-03-19 NOTE — Progress Notes (Signed)
CARDIOLOGY OFFICE NOTE  Date:  03/19/2017    Phyllis Caldwell Date of Birth: Feb 18, 1941 Medical Record #[id number]  PCP:  Emeterio Reeve, MD  Cardiologist:  Katrinka Blazing    Chief Complaint  Patient presents with  . Shortness of Breath    Work in visit - seen for Dr. Katrinka Blazing    History of Present Illness: Phyllis Caldwell is a 76 y.o. female who presents today for a work in visit. Seen for Dr. Katrinka Blazing.   She has a history of known CAD with prior myocardial infarction. She has had an anterior wall infarct due to occlusion of a small anterolateral branch. PCI was not performed. She had 30-40% distal left main and scattered obstructions in both the LAD and circumflex - all less than 50%. There was a 60-70% ostial PDA noted.   Last seen 2 years ago - noted chest tightness - Myoview was updated - this turned out ok. Dr. Katrinka Blazing recommended continued medical management. She has not been seen here since.   Comes in today. Here alone. Referred back by her PCP for shortness of breath. She has returned to smoking - she does not quantify - says she "is working on it". Records note that she is not compliant with her medicines due to cost constraints - she admits this to me as well. Chronic pain issues. Not able to see ortho due to cost. Decreased appetite - has lost weight. She has had recent labs - normal TSH noted.  UA was dirty with +nitrite/leukocyte - this was treated. Asking about what to take for allergies. She tells me that she is really not short of breath- she will have a "band like" sensation go around her chest - usually while standing and doing dishes - she feels like this cuts her air off - she attributes it to muscles/back pain. No angina noted. She has no symptoms when she exerts herself. She has a sensation that she "is tilted over" - not really dizzy. She has fallen but attributes this more to foot drop. No serious injury. She would like a different statin due to Gi complaints. She does not feel  like she needs any further testing and feels like her cardiac status is ok.   Past Medical History:  Diagnosis Date  . Asthma   . Cataract   . COPD (chronic obstructive pulmonary disease) (HCC)   . Coronary artery disease   . Depression   . Hypertension   . Poor dentition   . Thyroid disease   . Tobacco use     Past Surgical History:  Procedure Laterality Date  . ABDOMINAL HYSTERECTOMY    . LEFT HEART CATHETERIZATION WITH CORONARY ANGIOGRAM N/A 10/07/2012   Procedure: LEFT HEART CATHETERIZATION WITH CORONARY ANGIOGRAM;  Surgeon: Robynn Pane, MD;  Location: [facility] CATH LAB;  Service: Cardiovascular;  Laterality: N/A;  . THYROID SURGERY       Medications: Current Outpatient Prescriptions  Medication Sig Dispense Refill  . aspirin EC 81 MG tablet Take 81 mg by mouth daily.    Marland Kitchen amLODipine (NORVASC) 5 MG tablet Take 1 tablet (5 mg total) by mouth daily. Reported on 01/05/2016 30 tablet 0  . levothyroxine (SYNTHROID, LEVOTHROID) 50 MCG tablet Take 1 tablet (50 mcg total) by mouth daily before breakfast. Reported on 01/05/2016 30 tablet 0  . pantoprazole (PROTONIX) 40 MG tablet Take 1 tablet (40 mg total) by mouth daily at 12 noon. 30 tablet 3  . rosuvastatin (CRESTOR) 10  MG tablet Take 1 tablet (10 mg total) by mouth daily. 90 tablet 3   No current facility-administered medications for this visit.     Allergies: Allergies  Allergen Reactions  . Coconut Fatty Acids     unknown  . Codeine Nausea And Vomiting  . Penicillins     Patient doesn't remember  . Sulfa Antibiotics     unknown    Social History: The patient  reports that she has been smoking.  She has never used smokeless tobacco. She reports that she does not drink alcohol or use drugs.   Family History: The patient's family history includes Heart attack in her father and mother; Heart disease in her mother; Prostate cancer in her father; Stroke in her mother.   Review of Systems: Please see the history of  present illness.   Otherwise, the review of systems is positive for none.   All other systems are reviewed and negative.   Physical Exam: VS:  BP 130/90   Pulse 64   Ht  (1.753 m)   Wt 149 lb (67.6 kg)   SpO2 98% Comment: at rest  BMI 22.00 kg/m  .  BMI Body mass index is 22 kg/m.  Wt Readings from Last 3 Encounters:  03/19/17 149 lb (67.6 kg)  09/09/16 152 lb (68.9 kg)  01/05/16 147 lb (66.7 kg)    General: Looks older than her stated age. Affect flat. She is in no acute distress.   HEENT: Normal - dentition is quite poor.  Neck: Supple, no JVD, carotid bruits, or masses noted.  Cardiac: Regular rate and rhythm. No murmurs, rubs, or gallops. No edema.  Respiratory:  Lungs are clear to auscultation bilaterally with normal work of breathing.  GI: Soft and nontender.  MS: No deformity or atrophy. Gait and ROM intact.  Skin: Warm and dry. Color is normal.  Neuro:  Strength and sensation are intact and no gross focal deficits noted.  Psych: Alert, appropriate and with normal affect.   LABORATORY DATA:  EKG:  EKG is not ordered today. EKG from PCP from 03/05/17 noted - shows NSR.   Lab Results  Component Value Date   WBC 6.6 09/11/2016   HGB 11.9 (L) 09/11/2016   HCT 36.7 09/11/2016   PLT 236 09/11/2016   GLUCOSE 111 (H) 09/11/2016   CHOL 187 09/09/2016   TRIG 93 09/09/2016   HDL 47 09/09/2016   LDLCALC 121 (H) 09/09/2016   ALT 19 10/07/2012   AST 116 (H) 10/07/2012   NA 140 09/11/2016   K 3.7 09/11/2016   CL 107 09/11/2016   CREATININE 1.04 (H) 09/11/2016   BUN 18 09/11/2016   CO2 27 09/11/2016   TSH 2.982 09/09/2016   INR 1.14 10/07/2012    BNP (last 3 results) No results for input(s): BNP in the last 8760 hours.  ProBNP (last 3 results) No results for input(s): PROBNP in the last 8760 hours.   Other Studies Reviewed Today:  Myoview Impression/2016 Exercise Capacity:  Lexiscan with no exercise. BP Response:  Normal blood pressure  response. Clinical Symptoms:  No chest pain. ECG Impression:  No significant ST segment change suggestive of ischemia. Comparison with Prior Nuclear Study: No images to compare  Overall Impression:  Normal stress nuclear study.  LV Ejection Fraction: 60%.  LV Wall Motion:  NL LV Function; NL Wall Motion  Cassell Clement  MD    Assessment/Plan:  1. CAD with prior MI: not felt to have active symptoms. Would  favor continued medical management. She needs CV risk factor modification with smoking cessation primarily - I do not get the feeling that she is going to be able to stop. Do not feel further testing is indicated at this time. Encouraged her to take her medicines regularly.   2. Essential hypertension: BP is fair. She does not take her medicines consistently. Discussed at length.   3. Tobacco use: total cessation encouraged.   4. Dyspnea - seems more like this from back pain - I don't really think she needs further testing at this time from our standpoint - she really does not want further testing at this time.   5. COPD - again, smoking cessation is encouraged.   6. HLD - on statin - recent lipids with TC 209, LDL 143, HLD 50 and Trig - 83. Ok to change to [employer] - labs in 6 weeks.   7. Balance issues/foot drop - may need neurology referral - will defer to PCP  Current medicines are reviewed with the patient today.  The patient does not have concerns regarding medicines other than what has been noted above.  The following changes have been made:  See above.  Labs/ tests ordered today include:    Orders Placed This Encounter  Procedures  . Basic metabolic panel  . Hepatic function panel  . Lipid panel     Disposition:   FU with Dr. Katrinka Blazing in one year.   Patient is agreeable to this plan and will call if any problems develop in the interim.   SignedNorma Fredrickson, NP  03/19/2017 3:43 PM  [facility] [address] [place], [place]  [address] Phone: [phone] Fax: [phone]

## 2017-04-23 ENCOUNTER — Other Ambulatory Visit: Payer: Medicare HMO | Admitting: *Deleted

## 2017-04-23 DIAGNOSIS — Z720 Tobacco use: Secondary | ICD-10-CM | POA: Diagnosis not present

## 2017-04-23 DIAGNOSIS — R0602 Shortness of breath: Secondary | ICD-10-CM | POA: Diagnosis not present

## 2017-04-23 DIAGNOSIS — I2510 Atherosclerotic heart disease of native coronary artery without angina pectoris: Secondary | ICD-10-CM | POA: Diagnosis not present

## 2017-04-24 ENCOUNTER — Other Ambulatory Visit: Payer: Self-pay | Admitting: *Deleted

## 2017-04-24 DIAGNOSIS — I2510 Atherosclerotic heart disease of native coronary artery without angina pectoris: Secondary | ICD-10-CM

## 2017-04-24 LAB — BASIC METABOLIC PANEL
BUN/Creatinine Ratio: 24 (ref 12–28)
BUN: 20 mg/dL (ref 8–27)
CO2: 24 mmol/L (ref 18–29)
Calcium: 9.5 mg/dL (ref 8.7–10.3)
Chloride: 104 mmol/L (ref 96–106)
Creatinine, Ser: 0.83 mg/dL (ref 0.57–1.00)
GFR calc Af Amer: 80 mL/min/{1.73_m2} (ref >59)
GFR calc non Af Amer: 69 mL/min/{1.73_m2} (ref >59)
Glucose: 89 mg/dL (ref 65–99)
Potassium: 4.3 mmol/L (ref 3.5–5.2)
Sodium: 141 mmol/L (ref 134–144)

## 2017-04-24 LAB — LIPID PANEL
Chol/HDL Ratio: 3.6 ratio (ref 0.0–4.4)
Cholesterol, Total: 181 mg/dL (ref 100–199)
HDL: 50 mg/dL (ref >39)
LDL Calculated: 115 mg/dL — ABNORMAL HIGH (ref 0–99)
Triglycerides: 82 mg/dL (ref 0–149)
VLDL Cholesterol Cal: 16 mg/dL (ref 5–40)

## 2017-04-24 LAB — HEPATIC FUNCTION PANEL
ALT: 12 IU/L (ref 0–32)
AST: 10 IU/L (ref 0–40)
Albumin: 3.8 g/dL (ref 3.5–4.8)
Alkaline Phosphatase: 102 IU/L (ref 39–117)
Bilirubin Total: 0.7 mg/dL (ref 0.0–1.2)
Bilirubin, Direct: 0.15 mg/dL (ref 0.00–0.40)
Total Protein: 6.3 g/dL (ref 6.0–8.5)

## 2017-04-24 MED ORDER — ROSUVASTATIN CALCIUM 10 MG PO TABS: 10.0000 mg | ORAL_TABLET | Freq: Every day | ORAL | 3 refills | Status: DC

## 2017-05-09 ENCOUNTER — Encounter (HOSPITAL_COMMUNITY): Payer: Self-pay | Admitting: Emergency Medicine

## 2017-05-09 ENCOUNTER — Emergency Department (HOSPITAL_COMMUNITY)
Admission: EM | Admit: 2017-05-09 | Discharge: 2017-05-09 | Disposition: A | Discharge status: Home / Self Care | Payer: Medicare HMO | Attending: Emergency Medicine | Admitting: Emergency Medicine

## 2017-05-09 DIAGNOSIS — Z7982 Long term (current) use of aspirin: Secondary | ICD-10-CM | POA: Insufficient documentation

## 2017-05-09 DIAGNOSIS — I10 Essential (primary) hypertension: Secondary | ICD-10-CM | POA: Insufficient documentation

## 2017-05-09 DIAGNOSIS — Z79899 Other long term (current) drug therapy: Secondary | ICD-10-CM | POA: Insufficient documentation

## 2017-05-09 DIAGNOSIS — J45909 Unspecified asthma, uncomplicated: Secondary | ICD-10-CM | POA: Diagnosis not present

## 2017-05-09 DIAGNOSIS — J449 Chronic obstructive pulmonary disease, unspecified: Secondary | ICD-10-CM | POA: Diagnosis not present

## 2017-05-09 DIAGNOSIS — R600 Localized edema: Secondary | ICD-10-CM | POA: Diagnosis not present

## 2017-05-09 DIAGNOSIS — L0390 Cellulitis, unspecified: Secondary | ICD-10-CM

## 2017-05-09 DIAGNOSIS — I252 Old myocardial infarction: Secondary | ICD-10-CM | POA: Insufficient documentation

## 2017-05-09 DIAGNOSIS — F17200 Nicotine dependence, unspecified, uncomplicated: Secondary | ICD-10-CM | POA: Diagnosis not present

## 2017-05-09 DIAGNOSIS — L03211 Cellulitis of face: Secondary | ICD-10-CM | POA: Diagnosis not present

## 2017-05-09 DIAGNOSIS — R220 Localized swelling, mass and lump, head: Secondary | ICD-10-CM | POA: Diagnosis not present

## 2017-05-09 LAB — CBC WITH DIFFERENTIAL/PLATELET
Basophils Absolute: 0 10*3/uL (ref 0.0–0.1)
Basophils Relative: 1 %
Eosinophils Absolute: 0.2 10*3/uL (ref 0.0–0.7)
Eosinophils Relative: 2 %
HCT: 40.4 % (ref 36.0–46.0)
Hemoglobin: 13.8 g/dL (ref 12.0–15.0)
Lymphocytes Relative: 25 %
Lymphs Abs: 2 10*3/uL (ref 0.7–4.0)
MCH: 30.8 pg (ref 26.0–34.0)
MCHC: 34.2 g/dL (ref 30.0–36.0)
MCV: 90.2 fL (ref 78.0–100.0)
Monocytes Absolute: 0.4 10*3/uL (ref 0.1–1.0)
Monocytes Relative: 6 %
Neutro Abs: 5.2 10*3/uL (ref 1.7–7.7)
Neutrophils Relative %: 66 %
Platelets: 264 10*3/uL (ref 150–400)
RBC: 4.48 MIL/uL (ref 3.87–5.11)
RDW: 13.3 % (ref 11.5–15.5)
WBC: 7.8 10*3/uL (ref 4.0–10.5)

## 2017-05-09 LAB — URINALYSIS, ROUTINE W REFLEX MICROSCOPIC
Bilirubin Urine: NEGATIVE
Glucose, UA: NEGATIVE mg/dL
Hgb urine dipstick: NEGATIVE
Ketones, ur: NEGATIVE mg/dL
Nitrite: NEGATIVE
Protein, ur: NEGATIVE mg/dL
Specific Gravity, Urine: 1.023 (ref 1.005–1.030)
pH: 5 (ref 5.0–8.0)

## 2017-05-09 LAB — COMPREHENSIVE METABOLIC PANEL
ALT: 10 U/L — ABNORMAL LOW (ref 14–54)
AST: 14 U/L — ABNORMAL LOW (ref 15–41)
Albumin: 3.8 g/dL (ref 3.5–5.0)
Alkaline Phosphatase: 105 U/L (ref 38–126)
Anion gap: 7 (ref 5–15)
BUN: 20 mg/dL (ref 6–20)
CO2: 27 mmol/L (ref 22–32)
Calcium: 9.7 mg/dL (ref 8.9–10.3)
Chloride: 107 mmol/L (ref 101–111)
Creatinine, Ser: 1.07 mg/dL — ABNORMAL HIGH (ref 0.44–1.00)
GFR calc Af Amer: 57 mL/min — ABNORMAL LOW (ref >60)
GFR calc non Af Amer: 49 mL/min — ABNORMAL LOW (ref >60)
Glucose, Bld: 113 mg/dL — ABNORMAL HIGH (ref 65–99)
Potassium: 3.5 mmol/L (ref 3.5–5.1)
Sodium: 141 mmol/L (ref 135–145)
Total Bilirubin: 1 mg/dL (ref 0.3–1.2)
Total Protein: 6.9 g/dL (ref 6.5–8.1)

## 2017-05-09 LAB — I-STAT CG4 LACTIC ACID, ED: Lactic Acid, Venous: 0.51 mmol/L (ref 0.5–1.9)

## 2017-05-09 MED ORDER — FAMOTIDINE 20 MG PO TABS: 20.0000 mg | ORAL_TABLET | Freq: Two times a day (BID) | ORAL | 0 refills | Status: DC

## 2017-05-09 MED ORDER — IOPAMIDOL (ISOVUE-300) INJECTION 61%
INTRAVENOUS | Status: AC
  Filled 2017-05-09: qty 75

## 2017-05-09 MED ORDER — DIPHENHYDRAMINE HCL 25 MG PO TABS: 25.0000 mg | ORAL_TABLET | Freq: Four times a day (QID) | ORAL | 0 refills | Status: DC

## 2017-05-09 NOTE — Discharge Instructions (Signed)
Please start taking benadryl and pepcid as dicussed with Dr. Azell DerWetnz. Make sure you follow up with your primary care doctor for possible referral to allergist. Return if you develop worsening fever, swelling, vision changes, pain with eye movement, difficulty swallowing or breathing.

## 2017-05-09 NOTE — ED Notes (Signed)
Pt has been diagnosed with cellulitis & pt thinks this is the same. Pt wants to know what causes it so that she does not have to be seen for this again.

## 2017-05-09 NOTE — ED Provider Notes (Signed)
  Face-to-face evaluation   History: She is here for evaluation of left-sided facial swelling, similar to the problem that she had in September 2017.  At that time she was treated for allergy, and possible infection.  She improved with this treatment.  He denies fever, vomiting, weakness or dizziness.  Physical exam: Alert elderly female, who is comfortable.  Mouth with poor dentition.  No trismus.  Left facial angioedema, upper and lower eyelids, left cheek, left mandibular area and left anterior neck.  No intraoral angioedema.  No respiratory distress.  MDM-recurrent angioedema face, cause not clear.  No indication for use of antibiotics at this time.  Recommend H1 and H2 blockade, for 5 days, longer if needed.  Consider referral to allergist, by PCP, for further evaluation.  Medical screening examination/treatment/procedure(s) were conducted as a shared visit with non-physician practitioner(s) and myself.  I personally evaluated the patient during the encounter    Mancel BaleWentz, Tenna Lacko, MD 05/11/17 2010

## 2017-05-09 NOTE — ED Triage Notes (Addendum)
Patient sent from [facility], c/o swelling to left face since last night. Hx cellulitis. Denies difficulty breathing and swelling to throat and tongue. Speaking in full sentences. Ambulatory to triage. Denies fevers, N/V/D, chest pain and SOB.

## 2017-05-11 NOTE — ED Provider Notes (Signed)
[facility]-EMERGENCY DEPT Provider Note   CSN: [id number] Arrival date & time: 05/09/17  1510     History   Chief Complaint Chief Complaint  Patient presents with  . Facial Swelling    HPI Phyllis Caldwell is a 76 y.o. female.  HPI Patient presents to the emergency Department today with complaints of left-sided facial swelling onset this a.m. Endorses associated redness and minimal pain. States she had a similar episode of this and September 2017 and was treated as possible infection given IV clindamycin and hospitalization. Patient states that symptoms improved with treatment. Patient denies any difficulties breathing or swelling to the tongue or throat. The patient was seen at urgent care prior to arrival and sent to the ED for evaluation. Patient denies any fever, nausea, emesis, weakness, dizziness, vision changes, headache, tongue swelling. She has not tried anything for her symptoms prior to arrival. Past Medical History:  Diagnosis Date  . Asthma   . Cataract   . COPD (chronic obstructive pulmonary disease) (HCC)   . Coronary artery disease   . Depression   . Hypertension   . Poor dentition   . Thyroid disease   . Tobacco use     Patient Active Problem List   Diagnosis Date Noted  . Cellulitis diffuse, face 09/09/2016  . Poor dentition 09/09/2016  . Thyroid disease   . Hypertension   . COPD (chronic obstructive pulmonary disease) (HCC) 12/30/2013  . Tobacco use 12/30/2013  . CAD in native artery 12/30/2013  . Old MI (myocardial infarction) 12/30/2013  . Essential hypertension 12/30/2013    Past Surgical History:  Procedure Laterality Date  . ABDOMINAL HYSTERECTOMY    . LEFT HEART CATHETERIZATION WITH CORONARY ANGIOGRAM N/A 10/07/2012   Procedure: LEFT HEART CATHETERIZATION WITH CORONARY ANGIOGRAM;  Surgeon: Robynn PaneMohan N Harwani, MD;  [facility];  Service: Cardiovascular;  Laterality: N/A;  . THYROID SURGERY      OB History    No data available        Home Medications    Prior to Admission medications   Medication Sig Start Date End Date Taking? Authorizing Provider  amLODipine (NORVASC) 5 MG tablet Take 1 tablet (5 mg total) by mouth daily. Reported on 01/05/2016 09/11/16  Yes Clydia LlanoElmahi, Mutaz, MD  aspirin EC 81 MG tablet Take 81 mg by mouth daily.   Yes [provider]  Atorvastatin Calcium (LIPITOR PO) Take 1 tablet by mouth daily.   Yes [provider]  cholecalciferol (VITAMIN D) 1000 units tablet Take 1,000 Units by mouth daily.   Yes [provider]  ciprofloxacin (CIPRO) 500 MG tablet Take 500 mg by mouth once.   Yes [provider]  levothyroxine (SYNTHROID, LEVOTHROID) 50 MCG tablet Take 1 tablet (50 mcg total) by mouth daily before breakfast. Reported on 01/05/2016 09/11/16  Yes Clydia LlanoElmahi, Mutaz, MD  pantoprazole (PROTONIX) 40 MG tablet Take 1 tablet (40 mg total) by mouth daily at 12 noon. 09/11/16  Yes Clydia LlanoElmahi, Mutaz, MD  rosuvastatin (CRESTOR) 10 MG tablet Take 1 tablet (10 mg total) by mouth daily. 04/24/17 07/23/17 Yes Rosalio MacadamiaGerhardt, Lori C, NP  diphenhydrAMINE (BENADRYL) 25 MG tablet Take 1 tablet (25 mg total) by mouth every 6 (six) hours. 05/09/17   Rise MuLeaphart, Elvan Ebron T, PA-C  famotidine (PEPCID) 20 MG tablet Take 1 tablet (20 mg total) by mouth 2 (two) times daily. 05/09/17   Rise MuLeaphart, Izic Stfort T, PA-C    Family History Family History  Problem Relation Age of Onset  . Heart  disease Mother   . Heart attack Mother   . Stroke Mother   . Heart attack Father   . Prostate cancer Father     Social History Social History  Substance Use Topics  . Smoking status: Current Every Day Smoker  . Smokeless tobacco: Never Used  . Alcohol use No     Allergies   Coconut fatty acids; Codeine; Penicillins; and Sulfa antibiotics   Review of Systems Review of Systems  Constitutional: Negative for chills and fever.  HENT: Negative for trouble swallowing.   Eyes: Negative for pain and visual disturbance.   Respiratory: Negative for cough and shortness of breath.   Cardiovascular: Negative for chest pain and palpitations.  Gastrointestinal: Negative for abdominal pain, nausea and vomiting.  Genitourinary: Negative for dysuria, hematuria and urgency.  Musculoskeletal: Negative for arthralgias and back pain.  Skin: Positive for color change and rash.  Neurological: Negative for dizziness, syncope, facial asymmetry, weakness, light-headedness and headaches.  All other systems reviewed and are negative.    Physical Exam Updated Vital Signs BP (!) 157/98 (BP Location: Left Arm)   Pulse 66   Temp 98.6 F (37 C) (Oral)   Resp 18   Wt 65.8 kg (145 lb)   SpO2 100%   BMI 21.41 kg/m   Physical Exam  Constitutional: She is oriented to person, place, and time. She appears well-developed and well-nourished. No distress.  HENT:  Head: Normocephalic and atraumatic.  Mouth/Throat: Oropharynx is clear and moist.  Left facial angioedema, upper and lower eyelids, left cheek, left mandibular area and left anterior neck  With small amount of overlying erythema no warmth noted. No tongue swelling. Oropharynx is clear. Managing secretions and maintaining her airway.  Poor dentition throughout. No concerning signs of abscess. No trismus. Uvula is midline.  Eyes: Conjunctivae are normal. Right eye exhibits no discharge. Left eye exhibits no discharge. No scleral icterus.  Neck: Normal range of motion. Neck supple. No thyromegaly present.  Cardiovascular: Normal rate, regular rhythm, normal heart sounds and intact distal pulses.   Pulmonary/Chest: Effort normal and breath sounds normal. No respiratory distress. She has no wheezes. She has no rales. She exhibits no tenderness.  Abdominal: Soft. Bowel sounds are normal.  Musculoskeletal: Normal range of motion.  Lymphadenopathy:    She has no cervical adenopathy.  Neurological: She is alert and oriented to person, place, and time.  Skin: Skin is warm and  dry. Capillary refill takes less than 2 seconds.  Nursing note and vitals reviewed.      ED Treatments / Results  Labs (all labs ordered are listed, but only abnormal results are displayed) Labs Reviewed  COMPREHENSIVE METABOLIC PANEL - Abnormal; Notable for the following:       Result Value   Glucose, Bld 113 (*)    Creatinine, Ser 1.07 (*)    AST 14 (*)    ALT 10 (*)    GFR calc non Af Amer 49 (*)    GFR calc Af Amer 57 (*)    All other components within normal limits  URINALYSIS, ROUTINE W REFLEX MICROSCOPIC - Abnormal; Notable for the following:    Color, Urine AMBER (*)    APPearance HAZY (*)    Leukocytes, UA MODERATE (*)    Bacteria, UA FEW (*)    Squamous Epithelial / LPF 6-30 (*)    All other components within normal limits  CBC WITH DIFFERENTIAL/PLATELET  I-STAT CG4 LACTIC ACID, ED    EKG  EKG Interpretation None  Radiology No results found.  Procedures Procedures (including critical care time)  Medications Ordered in ED Medications - No data to display   Initial Impression / Assessment and Plan / ED Course  I have reviewed the triage vital signs and the nursing notes.  Pertinent labs & imaging results that were available during my care of the patient were reviewed by me and considered in my medical decision making (see chart for details).    Patient presents to the ED with complaints of edema and erythema to the left side of her face. History of same with diagnosis of allergic reaction versus infection and treated with IV clindamycin. Patient is nontoxic appearing. Vital signs are stable. Labs and reassuring. No leukocytosis noted. Patient denies any difficulties breathing or swallowing. Oropharynx is clear. This is likely recurrent angioedema of the face with an unknown cause. Discussed with Dr. Effie Shy who saw pt who does not recommend abx or imaging at this time. No visual changes concerning for orbital or periorbital cellulitis. Recommends  benadryl and pepcid with allergist follow up. Pt symp improved in ed without intervention. Erythema almost completely resolved. Feel that pt is stable for discharge. Pt is hemodynamically stable, in NAD, & able to ambulate in the ED. Pt has no complaints prior to dc. Pt is comfortable with above plan and is stable for discharge at this time. All questions were answered prior to disposition. Strict return precautions for f/u to the ED were discussed.     Final Clinical Impressions(s) / ED Diagnoses   Final diagnoses:  Facial swelling    New Prescriptions Discharge Medication List as of 05/09/2017  9:18 PM       Rise Mu, PA-C 05/11/17 0017    Mancel Bale, MD 05/11/17 2009

## 2017-07-18 ENCOUNTER — Other Ambulatory Visit: Payer: Medicare HMO

## 2018-01-04 DIAGNOSIS — M5090 Cervical disc disorder, unspecified, unspecified cervical region: Secondary | ICD-10-CM | POA: Diagnosis not present

## 2018-01-04 DIAGNOSIS — E039 Hypothyroidism, unspecified: Secondary | ICD-10-CM | POA: Diagnosis not present

## 2018-01-04 DIAGNOSIS — R1310 Dysphagia, unspecified: Secondary | ICD-10-CM | POA: Diagnosis not present

## 2018-01-04 DIAGNOSIS — R42 Dizziness and giddiness: Secondary | ICD-10-CM | POA: Diagnosis not present

## 2018-01-04 DIAGNOSIS — I25119 Atherosclerotic heart disease of native coronary artery with unspecified angina pectoris: Secondary | ICD-10-CM | POA: Diagnosis not present

## 2018-01-04 DIAGNOSIS — M129 Arthropathy, unspecified: Secondary | ICD-10-CM | POA: Diagnosis not present

## 2018-04-05 DIAGNOSIS — R1310 Dysphagia, unspecified: Secondary | ICD-10-CM | POA: Diagnosis not present

## 2018-04-05 DIAGNOSIS — E538 Deficiency of other specified B group vitamins: Secondary | ICD-10-CM | POA: Diagnosis not present

## 2018-04-05 DIAGNOSIS — M5090 Cervical disc disorder, unspecified, unspecified cervical region: Secondary | ICD-10-CM | POA: Diagnosis not present

## 2018-04-05 DIAGNOSIS — R2689 Other abnormalities of gait and mobility: Secondary | ICD-10-CM | POA: Diagnosis not present

## 2018-04-05 DIAGNOSIS — I25119 Atherosclerotic heart disease of native coronary artery with unspecified angina pectoris: Secondary | ICD-10-CM | POA: Diagnosis not present

## 2018-04-05 DIAGNOSIS — E039 Hypothyroidism, unspecified: Secondary | ICD-10-CM | POA: Diagnosis not present

## 2018-04-05 DIAGNOSIS — R42 Dizziness and giddiness: Secondary | ICD-10-CM | POA: Diagnosis not present

## 2018-04-08 DIAGNOSIS — I25119 Atherosclerotic heart disease of native coronary artery with unspecified angina pectoris: Secondary | ICD-10-CM | POA: Diagnosis not present

## 2018-04-08 DIAGNOSIS — J439 Emphysema, unspecified: Secondary | ICD-10-CM | POA: Diagnosis not present

## 2018-04-08 DIAGNOSIS — I129 Hypertensive chronic kidney disease with stage 1 through stage 4 chronic kidney disease, or unspecified chronic kidney disease: Secondary | ICD-10-CM | POA: Diagnosis not present

## 2018-04-08 DIAGNOSIS — N182 Chronic kidney disease, stage 2 (mild): Secondary | ICD-10-CM | POA: Diagnosis not present

## 2018-04-08 DIAGNOSIS — M419 Scoliosis, unspecified: Secondary | ICD-10-CM | POA: Diagnosis not present

## 2018-04-08 DIAGNOSIS — M5030 Other cervical disc degeneration, unspecified cervical region: Secondary | ICD-10-CM | POA: Diagnosis not present

## 2018-04-09 DIAGNOSIS — I129 Hypertensive chronic kidney disease with stage 1 through stage 4 chronic kidney disease, or unspecified chronic kidney disease: Secondary | ICD-10-CM | POA: Diagnosis not present

## 2018-04-09 DIAGNOSIS — M5030 Other cervical disc degeneration, unspecified cervical region: Secondary | ICD-10-CM | POA: Diagnosis not present

## 2018-04-09 DIAGNOSIS — I25119 Atherosclerotic heart disease of native coronary artery with unspecified angina pectoris: Secondary | ICD-10-CM | POA: Diagnosis not present

## 2018-04-09 DIAGNOSIS — M419 Scoliosis, unspecified: Secondary | ICD-10-CM | POA: Diagnosis not present

## 2018-04-09 DIAGNOSIS — N182 Chronic kidney disease, stage 2 (mild): Secondary | ICD-10-CM | POA: Diagnosis not present

## 2018-04-09 DIAGNOSIS — J439 Emphysema, unspecified: Secondary | ICD-10-CM | POA: Diagnosis not present

## 2018-04-10 DIAGNOSIS — E538 Deficiency of other specified B group vitamins: Secondary | ICD-10-CM | POA: Diagnosis not present

## 2018-04-11 DIAGNOSIS — I129 Hypertensive chronic kidney disease with stage 1 through stage 4 chronic kidney disease, or unspecified chronic kidney disease: Secondary | ICD-10-CM | POA: Diagnosis not present

## 2018-04-11 DIAGNOSIS — I25119 Atherosclerotic heart disease of native coronary artery with unspecified angina pectoris: Secondary | ICD-10-CM | POA: Diagnosis not present

## 2018-04-11 DIAGNOSIS — J439 Emphysema, unspecified: Secondary | ICD-10-CM | POA: Diagnosis not present

## 2018-04-11 DIAGNOSIS — N182 Chronic kidney disease, stage 2 (mild): Secondary | ICD-10-CM | POA: Diagnosis not present

## 2018-04-11 DIAGNOSIS — M5030 Other cervical disc degeneration, unspecified cervical region: Secondary | ICD-10-CM | POA: Diagnosis not present

## 2018-04-11 DIAGNOSIS — M419 Scoliosis, unspecified: Secondary | ICD-10-CM | POA: Diagnosis not present

## 2018-04-12 DIAGNOSIS — I25119 Atherosclerotic heart disease of native coronary artery with unspecified angina pectoris: Secondary | ICD-10-CM | POA: Diagnosis not present

## 2018-04-12 DIAGNOSIS — M5030 Other cervical disc degeneration, unspecified cervical region: Secondary | ICD-10-CM | POA: Diagnosis not present

## 2018-04-12 DIAGNOSIS — M419 Scoliosis, unspecified: Secondary | ICD-10-CM | POA: Diagnosis not present

## 2018-04-12 DIAGNOSIS — N182 Chronic kidney disease, stage 2 (mild): Secondary | ICD-10-CM | POA: Diagnosis not present

## 2018-04-12 DIAGNOSIS — J439 Emphysema, unspecified: Secondary | ICD-10-CM | POA: Diagnosis not present

## 2018-04-12 DIAGNOSIS — I129 Hypertensive chronic kidney disease with stage 1 through stage 4 chronic kidney disease, or unspecified chronic kidney disease: Secondary | ICD-10-CM | POA: Diagnosis not present

## 2018-04-15 DIAGNOSIS — I25119 Atherosclerotic heart disease of native coronary artery with unspecified angina pectoris: Secondary | ICD-10-CM | POA: Diagnosis not present

## 2018-04-15 DIAGNOSIS — M5030 Other cervical disc degeneration, unspecified cervical region: Secondary | ICD-10-CM | POA: Diagnosis not present

## 2018-04-15 DIAGNOSIS — J439 Emphysema, unspecified: Secondary | ICD-10-CM | POA: Diagnosis not present

## 2018-04-15 DIAGNOSIS — I129 Hypertensive chronic kidney disease with stage 1 through stage 4 chronic kidney disease, or unspecified chronic kidney disease: Secondary | ICD-10-CM | POA: Diagnosis not present

## 2018-04-15 DIAGNOSIS — M419 Scoliosis, unspecified: Secondary | ICD-10-CM | POA: Diagnosis not present

## 2018-04-15 DIAGNOSIS — N182 Chronic kidney disease, stage 2 (mild): Secondary | ICD-10-CM | POA: Diagnosis not present

## 2018-04-16 DIAGNOSIS — M419 Scoliosis, unspecified: Secondary | ICD-10-CM | POA: Diagnosis not present

## 2018-04-16 DIAGNOSIS — I129 Hypertensive chronic kidney disease with stage 1 through stage 4 chronic kidney disease, or unspecified chronic kidney disease: Secondary | ICD-10-CM | POA: Diagnosis not present

## 2018-04-16 DIAGNOSIS — N182 Chronic kidney disease, stage 2 (mild): Secondary | ICD-10-CM | POA: Diagnosis not present

## 2018-04-16 DIAGNOSIS — M5030 Other cervical disc degeneration, unspecified cervical region: Secondary | ICD-10-CM | POA: Diagnosis not present

## 2018-04-16 DIAGNOSIS — I25119 Atherosclerotic heart disease of native coronary artery with unspecified angina pectoris: Secondary | ICD-10-CM | POA: Diagnosis not present

## 2018-04-16 DIAGNOSIS — J439 Emphysema, unspecified: Secondary | ICD-10-CM | POA: Diagnosis not present

## 2018-04-17 DIAGNOSIS — I129 Hypertensive chronic kidney disease with stage 1 through stage 4 chronic kidney disease, or unspecified chronic kidney disease: Secondary | ICD-10-CM | POA: Diagnosis not present

## 2018-04-17 DIAGNOSIS — M419 Scoliosis, unspecified: Secondary | ICD-10-CM | POA: Diagnosis not present

## 2018-04-17 DIAGNOSIS — N182 Chronic kidney disease, stage 2 (mild): Secondary | ICD-10-CM | POA: Diagnosis not present

## 2018-04-17 DIAGNOSIS — M5030 Other cervical disc degeneration, unspecified cervical region: Secondary | ICD-10-CM | POA: Diagnosis not present

## 2018-04-17 DIAGNOSIS — I25119 Atherosclerotic heart disease of native coronary artery with unspecified angina pectoris: Secondary | ICD-10-CM | POA: Diagnosis not present

## 2018-04-17 DIAGNOSIS — J439 Emphysema, unspecified: Secondary | ICD-10-CM | POA: Diagnosis not present

## 2018-04-22 ENCOUNTER — Other Ambulatory Visit: Payer: Self-pay | Admitting: Gastroenterology

## 2018-04-22 DIAGNOSIS — M419 Scoliosis, unspecified: Secondary | ICD-10-CM | POA: Diagnosis not present

## 2018-04-22 DIAGNOSIS — M5030 Other cervical disc degeneration, unspecified cervical region: Secondary | ICD-10-CM | POA: Diagnosis not present

## 2018-04-22 DIAGNOSIS — R1310 Dysphagia, unspecified: Secondary | ICD-10-CM

## 2018-04-22 DIAGNOSIS — K5900 Constipation, unspecified: Secondary | ICD-10-CM | POA: Diagnosis not present

## 2018-04-22 DIAGNOSIS — I25119 Atherosclerotic heart disease of native coronary artery with unspecified angina pectoris: Secondary | ICD-10-CM | POA: Diagnosis not present

## 2018-04-22 DIAGNOSIS — J439 Emphysema, unspecified: Secondary | ICD-10-CM | POA: Diagnosis not present

## 2018-04-22 DIAGNOSIS — N182 Chronic kidney disease, stage 2 (mild): Secondary | ICD-10-CM | POA: Diagnosis not present

## 2018-04-22 DIAGNOSIS — I129 Hypertensive chronic kidney disease with stage 1 through stage 4 chronic kidney disease, or unspecified chronic kidney disease: Secondary | ICD-10-CM | POA: Diagnosis not present

## 2018-04-25 DIAGNOSIS — M419 Scoliosis, unspecified: Secondary | ICD-10-CM | POA: Diagnosis not present

## 2018-04-25 DIAGNOSIS — I25119 Atherosclerotic heart disease of native coronary artery with unspecified angina pectoris: Secondary | ICD-10-CM | POA: Diagnosis not present

## 2018-04-25 DIAGNOSIS — J439 Emphysema, unspecified: Secondary | ICD-10-CM | POA: Diagnosis not present

## 2018-04-25 DIAGNOSIS — N182 Chronic kidney disease, stage 2 (mild): Secondary | ICD-10-CM | POA: Diagnosis not present

## 2018-04-25 DIAGNOSIS — M5030 Other cervical disc degeneration, unspecified cervical region: Secondary | ICD-10-CM | POA: Diagnosis not present

## 2018-04-25 DIAGNOSIS — I129 Hypertensive chronic kidney disease with stage 1 through stage 4 chronic kidney disease, or unspecified chronic kidney disease: Secondary | ICD-10-CM | POA: Diagnosis not present

## 2018-04-26 ENCOUNTER — Ambulatory Visit
Admission: RE | Admit: 2018-04-26 | Discharge: 2018-04-26 | Disposition: A | Discharge status: Home / Self Care | Payer: Medicare HMO | Source: Ambulatory Visit | Attending: Gastroenterology | Admitting: Gastroenterology

## 2018-04-26 DIAGNOSIS — R1310 Dysphagia, unspecified: Secondary | ICD-10-CM

## 2018-05-01 DIAGNOSIS — N182 Chronic kidney disease, stage 2 (mild): Secondary | ICD-10-CM | POA: Diagnosis not present

## 2018-05-01 DIAGNOSIS — I25119 Atherosclerotic heart disease of native coronary artery with unspecified angina pectoris: Secondary | ICD-10-CM | POA: Diagnosis not present

## 2018-05-01 DIAGNOSIS — J439 Emphysema, unspecified: Secondary | ICD-10-CM | POA: Diagnosis not present

## 2018-05-01 DIAGNOSIS — M5030 Other cervical disc degeneration, unspecified cervical region: Secondary | ICD-10-CM | POA: Diagnosis not present

## 2018-05-01 DIAGNOSIS — M419 Scoliosis, unspecified: Secondary | ICD-10-CM | POA: Diagnosis not present

## 2018-05-01 DIAGNOSIS — I129 Hypertensive chronic kidney disease with stage 1 through stage 4 chronic kidney disease, or unspecified chronic kidney disease: Secondary | ICD-10-CM | POA: Diagnosis not present

## 2018-05-07 DIAGNOSIS — I129 Hypertensive chronic kidney disease with stage 1 through stage 4 chronic kidney disease, or unspecified chronic kidney disease: Secondary | ICD-10-CM | POA: Diagnosis not present

## 2018-05-07 DIAGNOSIS — I25119 Atherosclerotic heart disease of native coronary artery with unspecified angina pectoris: Secondary | ICD-10-CM | POA: Diagnosis not present

## 2018-05-07 DIAGNOSIS — J439 Emphysema, unspecified: Secondary | ICD-10-CM | POA: Diagnosis not present

## 2018-05-07 DIAGNOSIS — N182 Chronic kidney disease, stage 2 (mild): Secondary | ICD-10-CM | POA: Diagnosis not present

## 2018-05-07 DIAGNOSIS — M5030 Other cervical disc degeneration, unspecified cervical region: Secondary | ICD-10-CM | POA: Diagnosis not present

## 2018-05-07 DIAGNOSIS — M419 Scoliosis, unspecified: Secondary | ICD-10-CM | POA: Diagnosis not present

## 2018-05-15 DIAGNOSIS — M419 Scoliosis, unspecified: Secondary | ICD-10-CM | POA: Diagnosis not present

## 2018-05-15 DIAGNOSIS — N182 Chronic kidney disease, stage 2 (mild): Secondary | ICD-10-CM | POA: Diagnosis not present

## 2018-05-15 DIAGNOSIS — I25119 Atherosclerotic heart disease of native coronary artery with unspecified angina pectoris: Secondary | ICD-10-CM | POA: Diagnosis not present

## 2018-05-15 DIAGNOSIS — M5030 Other cervical disc degeneration, unspecified cervical region: Secondary | ICD-10-CM | POA: Diagnosis not present

## 2018-05-15 DIAGNOSIS — I129 Hypertensive chronic kidney disease with stage 1 through stage 4 chronic kidney disease, or unspecified chronic kidney disease: Secondary | ICD-10-CM | POA: Diagnosis not present

## 2018-05-15 DIAGNOSIS — J439 Emphysema, unspecified: Secondary | ICD-10-CM | POA: Diagnosis not present

## 2018-05-22 DIAGNOSIS — N182 Chronic kidney disease, stage 2 (mild): Secondary | ICD-10-CM | POA: Diagnosis not present

## 2018-05-22 DIAGNOSIS — I129 Hypertensive chronic kidney disease with stage 1 through stage 4 chronic kidney disease, or unspecified chronic kidney disease: Secondary | ICD-10-CM | POA: Diagnosis not present

## 2018-05-22 DIAGNOSIS — I25119 Atherosclerotic heart disease of native coronary artery with unspecified angina pectoris: Secondary | ICD-10-CM | POA: Diagnosis not present

## 2018-05-22 DIAGNOSIS — J439 Emphysema, unspecified: Secondary | ICD-10-CM | POA: Diagnosis not present

## 2018-05-22 DIAGNOSIS — M419 Scoliosis, unspecified: Secondary | ICD-10-CM | POA: Diagnosis not present

## 2018-05-22 DIAGNOSIS — M5030 Other cervical disc degeneration, unspecified cervical region: Secondary | ICD-10-CM | POA: Diagnosis not present

## 2018-07-11 DIAGNOSIS — I25119 Atherosclerotic heart disease of native coronary artery with unspecified angina pectoris: Secondary | ICD-10-CM | POA: Diagnosis not present

## 2018-07-11 DIAGNOSIS — I10 Essential (primary) hypertension: Secondary | ICD-10-CM | POA: Diagnosis not present

## 2018-07-11 DIAGNOSIS — F322 Major depressive disorder, single episode, severe without psychotic features: Secondary | ICD-10-CM | POA: Diagnosis not present

## 2018-07-11 DIAGNOSIS — G894 Chronic pain syndrome: Secondary | ICD-10-CM | POA: Diagnosis not present

## 2018-07-11 DIAGNOSIS — M5090 Cervical disc disorder, unspecified, unspecified cervical region: Secondary | ICD-10-CM | POA: Diagnosis not present

## 2018-07-11 DIAGNOSIS — E039 Hypothyroidism, unspecified: Secondary | ICD-10-CM | POA: Diagnosis not present

## 2018-07-11 DIAGNOSIS — M545 Low back pain: Secondary | ICD-10-CM | POA: Diagnosis not present

## 2018-07-11 DIAGNOSIS — M25569 Pain in unspecified knee: Secondary | ICD-10-CM | POA: Diagnosis not present

## 2018-09-04 DIAGNOSIS — R42 Dizziness and giddiness: Secondary | ICD-10-CM | POA: Diagnosis not present

## 2018-09-04 DIAGNOSIS — R0602 Shortness of breath: Secondary | ICD-10-CM | POA: Diagnosis not present

## 2018-10-08 DIAGNOSIS — F331 Major depressive disorder, recurrent, moderate: Secondary | ICD-10-CM | POA: Diagnosis not present

## 2018-10-08 DIAGNOSIS — E538 Deficiency of other specified B group vitamins: Secondary | ICD-10-CM | POA: Diagnosis not present

## 2018-10-08 DIAGNOSIS — G894 Chronic pain syndrome: Secondary | ICD-10-CM | POA: Diagnosis not present

## 2019-01-16 DIAGNOSIS — I10 Essential (primary) hypertension: Secondary | ICD-10-CM | POA: Diagnosis not present

## 2019-01-16 DIAGNOSIS — I25119 Atherosclerotic heart disease of native coronary artery with unspecified angina pectoris: Secondary | ICD-10-CM | POA: Diagnosis not present

## 2019-01-16 DIAGNOSIS — E538 Deficiency of other specified B group vitamins: Secondary | ICD-10-CM | POA: Diagnosis not present

## 2019-01-16 DIAGNOSIS — G894 Chronic pain syndrome: Secondary | ICD-10-CM | POA: Diagnosis not present

## 2019-01-16 DIAGNOSIS — E039 Hypothyroidism, unspecified: Secondary | ICD-10-CM | POA: Diagnosis not present

## 2019-01-16 DIAGNOSIS — N3281 Overactive bladder: Secondary | ICD-10-CM | POA: Diagnosis not present

## 2019-01-16 DIAGNOSIS — F331 Major depressive disorder, recurrent, moderate: Secondary | ICD-10-CM | POA: Diagnosis not present

## 2019-01-26 ENCOUNTER — Observation Stay (HOSPITAL_COMMUNITY)
Admission: EM | Admit: 2019-01-26 | Discharge: 2019-01-27 | Disposition: A | Discharge status: Home / Self Care | Payer: Medicare HMO | Attending: Family Medicine | Admitting: Family Medicine

## 2019-01-26 ENCOUNTER — Other Ambulatory Visit: Payer: Self-pay

## 2019-01-26 ENCOUNTER — Emergency Department (HOSPITAL_COMMUNITY): Payer: Medicare HMO

## 2019-01-26 ENCOUNTER — Encounter (HOSPITAL_COMMUNITY): Payer: Self-pay

## 2019-01-26 DIAGNOSIS — E079 Disorder of thyroid, unspecified: Secondary | ICD-10-CM | POA: Insufficient documentation

## 2019-01-26 DIAGNOSIS — F329 Major depressive disorder, single episode, unspecified: Secondary | ICD-10-CM | POA: Diagnosis not present

## 2019-01-26 DIAGNOSIS — E440 Moderate protein-calorie malnutrition: Secondary | ICD-10-CM | POA: Diagnosis not present

## 2019-01-26 DIAGNOSIS — Z79890 Hormone replacement therapy (postmenopausal): Secondary | ICD-10-CM | POA: Diagnosis not present

## 2019-01-26 DIAGNOSIS — R471 Dysarthria and anarthria: Secondary | ICD-10-CM | POA: Diagnosis not present

## 2019-01-26 DIAGNOSIS — I2510 Atherosclerotic heart disease of native coronary artery without angina pectoris: Secondary | ICD-10-CM | POA: Insufficient documentation

## 2019-01-26 DIAGNOSIS — R42 Dizziness and giddiness: Secondary | ICD-10-CM | POA: Diagnosis not present

## 2019-01-26 DIAGNOSIS — R4781 Slurred speech: Secondary | ICD-10-CM | POA: Diagnosis not present

## 2019-01-26 DIAGNOSIS — I252 Old myocardial infarction: Secondary | ICD-10-CM | POA: Diagnosis not present

## 2019-01-26 DIAGNOSIS — J449 Chronic obstructive pulmonary disease, unspecified: Secondary | ICD-10-CM | POA: Insufficient documentation

## 2019-01-26 DIAGNOSIS — Z7982 Long term (current) use of aspirin: Secondary | ICD-10-CM | POA: Insufficient documentation

## 2019-01-26 DIAGNOSIS — Z79899 Other long term (current) drug therapy: Secondary | ICD-10-CM | POA: Diagnosis not present

## 2019-01-26 DIAGNOSIS — G459 Transient cerebral ischemic attack, unspecified: Secondary | ICD-10-CM | POA: Diagnosis not present

## 2019-01-26 DIAGNOSIS — F17200 Nicotine dependence, unspecified, uncomplicated: Secondary | ICD-10-CM | POA: Diagnosis not present

## 2019-01-26 DIAGNOSIS — I10 Essential (primary) hypertension: Secondary | ICD-10-CM | POA: Insufficient documentation

## 2019-01-26 DIAGNOSIS — R609 Edema, unspecified: Secondary | ICD-10-CM | POA: Diagnosis not present

## 2019-01-26 DIAGNOSIS — E039 Hypothyroidism, unspecified: Secondary | ICD-10-CM | POA: Insufficient documentation

## 2019-01-26 DIAGNOSIS — I959 Hypotension, unspecified: Secondary | ICD-10-CM | POA: Diagnosis not present

## 2019-01-26 LAB — COMPREHENSIVE METABOLIC PANEL
ALT: 10 U/L (ref 0–44)
AST: 11 U/L — ABNORMAL LOW (ref 15–41)
Albumin: 2.6 g/dL — ABNORMAL LOW (ref 3.5–5.0)
Alkaline Phosphatase: 59 U/L (ref 38–126)
Anion gap: 7 (ref 5–15)
BUN: 24 mg/dL — ABNORMAL HIGH (ref 8–23)
CO2: 20 mmol/L — ABNORMAL LOW (ref 22–32)
Calcium: 7.6 mg/dL — ABNORMAL LOW (ref 8.9–10.3)
Chloride: 116 mmol/L — ABNORMAL HIGH (ref 98–111)
Creatinine, Ser: 0.8 mg/dL (ref 0.44–1.00)
GFR calc Af Amer: >60 mL/min (ref >60)
GFR calc non Af Amer: >60 mL/min (ref >60)
Glucose, Bld: 69 mg/dL — ABNORMAL LOW (ref 70–99)
Potassium: 3 mmol/L — ABNORMAL LOW (ref 3.5–5.1)
Sodium: 143 mmol/L (ref 135–145)
Total Bilirubin: 0.5 mg/dL (ref 0.3–1.2)
Total Protein: 4.9 g/dL — ABNORMAL LOW (ref 6.5–8.1)

## 2019-01-26 LAB — URINALYSIS, ROUTINE W REFLEX MICROSCOPIC
Bacteria, UA: NONE SEEN
Bilirubin Urine: NEGATIVE
Glucose, UA: NEGATIVE mg/dL
Hgb urine dipstick: NEGATIVE
Ketones, ur: NEGATIVE mg/dL
Nitrite: NEGATIVE
Protein, ur: NEGATIVE mg/dL
Specific Gravity, Urine: 1.015 (ref 1.005–1.030)
pH: 6 (ref 5.0–8.0)

## 2019-01-26 LAB — CREATININE, SERUM
Creatinine, Ser: 1.03 mg/dL — ABNORMAL HIGH (ref 0.44–1.00)
GFR calc Af Amer: >60 mL/min (ref >60)
GFR calc non Af Amer: 52 mL/min — ABNORMAL LOW (ref >60)

## 2019-01-26 LAB — CBC
HCT: 37.3 % (ref 36.0–46.0)
HCT: 37.3 % (ref 36.0–46.0)
Hemoglobin: 11.8 g/dL — ABNORMAL LOW (ref 12.0–15.0)
Hemoglobin: 12.1 g/dL (ref 12.0–15.0)
MCH: 29.1 pg (ref 26.0–34.0)
MCH: 29.2 pg (ref 26.0–34.0)
MCHC: 31.6 g/dL (ref 30.0–36.0)
MCHC: 32.4 g/dL (ref 30.0–36.0)
MCV: 91.9 fL (ref 80.0–100.0)
MCV: 89.9 fL (ref 80.0–100.0)
Platelets: 252 10*3/uL (ref 150–400)
Platelets: 247 10*3/uL (ref 150–400)
RBC: 4.06 MIL/uL (ref 3.87–5.11)
RBC: 4.15 MIL/uL (ref 3.87–5.11)
RDW: 13.3 % (ref 11.5–15.5)
RDW: 13.2 % (ref 11.5–15.5)
WBC: 5.6 10*3/uL (ref 4.0–10.5)
WBC: 5.6 10*3/uL (ref 4.0–10.5)
nRBC: 0 % (ref 0.0–0.2)
nRBC: 0 % (ref 0.0–0.2)

## 2019-01-26 LAB — I-STAT CREATININE, ED: Creatinine, Ser: 0.9 mg/dL (ref 0.44–1.00)

## 2019-01-26 LAB — DIFFERENTIAL
Abs Immature Granulocytes: 0.02 10*3/uL (ref 0.00–0.07)
Basophils Absolute: 0 10*3/uL (ref 0.0–0.1)
Basophils Relative: 1 %
Eosinophils Absolute: 0.3 10*3/uL (ref 0.0–0.5)
Eosinophils Relative: 5 %
Immature Granulocytes: 0 %
Lymphocytes Relative: 31 %
Lymphs Abs: 1.7 10*3/uL (ref 0.7–4.0)
Monocytes Absolute: 0.5 10*3/uL (ref 0.1–1.0)
Monocytes Relative: 8 %
Neutro Abs: 3.1 10*3/uL (ref 1.7–7.7)
Neutrophils Relative %: 55 %

## 2019-01-26 LAB — RAPID URINE DRUG SCREEN, HOSP PERFORMED
Amphetamines: NOT DETECTED
Barbiturates: NOT DETECTED
Benzodiazepines: NOT DETECTED
Cocaine: NOT DETECTED
Opiates: NOT DETECTED
Tetrahydrocannabinol: NOT DETECTED

## 2019-01-26 LAB — APTT: aPTT: 27 seconds (ref 24–36)

## 2019-01-26 LAB — PROTIME-INR
INR: 0.96
Prothrombin Time: 12.7 seconds (ref 11.4–15.2)

## 2019-01-26 LAB — ETHANOL: Alcohol, Ethyl (B): <10 mg/dL (ref <10)

## 2019-01-26 MED ORDER — ACETAMINOPHEN 650 MG RE SUPP: 650.0000 mg | RECTAL | Status: DC | PRN

## 2019-01-26 MED ORDER — ACETAMINOPHEN 325 MG PO TABS
650.0000 mg | ORAL_TABLET | ORAL | Status: DC | PRN
  Administered 2019-01-26: 650 mg via ORAL
  Filled 2019-01-26: qty 2

## 2019-01-26 MED ORDER — ACETAMINOPHEN 160 MG/5ML PO SOLN: 650.0000 mg | ORAL | Status: DC | PRN

## 2019-01-26 MED ORDER — ASPIRIN 325 MG PO TABS
325.0000 mg | ORAL_TABLET | Freq: Every day | ORAL | Status: DC
  Administered 2019-01-27: 325 mg via ORAL
  Filled 2019-01-26: qty 1

## 2019-01-26 MED ORDER — ENOXAPARIN SODIUM 40 MG/0.4ML ~~LOC~~ SOLN
40.0000 mg | SUBCUTANEOUS | Status: DC
  Administered 2019-01-26: 40 mg via SUBCUTANEOUS
  Filled 2019-01-26: qty 0.4

## 2019-01-26 MED ORDER — POTASSIUM CHLORIDE 20 MEQ PO PACK
40.0000 meq | PACK | Freq: Two times a day (BID) | ORAL | Status: AC
  Administered 2019-01-26 – 2019-01-27 (×2): 40 meq via ORAL
  Filled 2019-01-26 (×2): qty 2

## 2019-01-26 MED ORDER — LORAZEPAM 2 MG/ML IJ SOLN
0.5000 mg | Freq: Once | INTRAMUSCULAR | Status: AC
  Administered 2019-01-26: 0.5 mg via INTRAVENOUS
  Filled 2019-01-26: qty 1

## 2019-01-26 MED ORDER — LEVOTHYROXINE SODIUM 50 MCG PO TABS
50.0000 ug | ORAL_TABLET | Freq: Every day | ORAL | Status: DC
  Administered 2019-01-27: 50 ug via ORAL
  Filled 2019-01-26: qty 1

## 2019-01-26 MED ORDER — ROSUVASTATIN CALCIUM 5 MG PO TABS
10.0000 mg | ORAL_TABLET | Freq: Every day | ORAL | Status: DC
  Filled 2019-01-26: qty 2

## 2019-01-26 MED ORDER — STROKE: EARLY STAGES OF RECOVERY BOOK
Freq: Once | Status: AC
  Administered 2019-01-26: 22:00:00
  Filled 2019-01-26: qty 1

## 2019-01-26 NOTE — ED Triage Notes (Signed)
Pt arrives by [facility] from c/o tongue swelling and dizziness. Pt's grandson called EMS at 0830 this morning; states grandmother had slurred speech. Pt states tongue feels "heavy and swollen."; Pt denies any pain.  States that she recently started new BP med. No aphasia observed. Pt a&o

## 2019-01-26 NOTE — ED Notes (Signed)
CODE STROKE initiated at 10:12 per Dr. Dalene Seltzer. Deficits slurred speech, oral swelling.

## 2019-01-26 NOTE — ED Notes (Signed)
Neuro NP at bedside

## 2019-01-26 NOTE — ED Notes (Signed)
Activated a code stroke with [facility], spoke with phil

## 2019-01-26 NOTE — ED Notes (Signed)
Patient ambulatory to restroom with cane.  

## 2019-01-26 NOTE — ED Notes (Signed)
Patient transported to MRI 

## 2019-01-26 NOTE — H&P (Addendum)
[facility] and Physical Service Pager: [phone]  Patient name: Phyllis Caldwell Medical record number: [id number] Date of birth: 12/03/1941 Age: 78 y.o. Gender: female   Primary Care Provider: Mila PalmerWolters, Akayla, MD Consultants: Neuro  Code Status: Full  Chief Complaint: Dysarthria, facial droop  Assessment and Plan: Phyllis Caldwell is a 78 y.o. female presenting with new onset dysarthria, perceived tongue numbness/swelling, and facial droop this morning around 8:30 AM, that is concerning for TIA vs stroke. PMH is significant for COPD, tobacco use, CAD with previous MI, essential hypertension, thyroid disease, poor dentition.  Dysarthria, tongue numbness, facial droop c/f TIA vs Stroke: Acute, resolved.  Patient reports symptom onset around 830 this am, subsequently back to baseline.  Additionally reports history of the symptoms in the past, all resolved without intervention.  On exam, she is hemodynamically stable and neurologically intact.  Labs notable for preserved renal and liver function, alcohol level <10, glucose 69, hemoglobin 11.8, and no leukocytosis.  CT head without acute hemorrhage or intracranial finding.  MRI/MRA head without any evidence of acute or subacute infarct, however showed concern for right A1 and V4 stenosis and extensive chronic small vessel ischemic changes within white matter.  Symptoms most consistent with TIA given quick resolution of symptoms and negative imaging finding with normal neuro exam. She does have known risk factors including age, hypertension, and CAD.  ABCD score 5 for TIA showing moderate risk, 9.8% 90-day stroke risk.  Much less likely to be due to CVA given no evidence on MRI head. Considered atypical presentation of hypoglycemia, glucose was 69 on BMP, however no history of diabetes or additional symptomatology.  Also considered allergic reaction due to tongue numbness and perceived swelling, however  hemodynamically stable and no evidence of swelling on exam without any history of new exposures. -Admit to medical telemetry, attending Dr. McDiarmid   -Neurology on board, appreciate further recommendations -A1c, lipid panel, TSH -Echocardiogram -Carotid Doppler bilaterally -Allowing for permissive hypertension up to 220/120 - Swallow evaluation, diet and p.o. medications held - Start aspirin 325, Crestor 10 (once tolerating PO) - PT/OT evaluation - Monitor vitals per routine -Frequent neuro checks, every 4  COPD: Chronic, uncontrolled. Patient reports frequent shortness of breath and coughing, almost daily.  Is not on any medications.  No report of previous exacerbations or hospitalizations for this.  Previous smoker, quit 1 year ago.  Comfortable on exam, able to speak in full sentences and satting well on RA. - Albuterol as needed --Outpatient workup with PFT - Consider starting LAMA therapy  - Monitor breathing status  CAD with previous MI: Chronic. Lateral wall MI due to 100% occlusion of inferior branch of LAD in October 2013.  No PCI performed.  Additional mild vessel disease, with exception of PDA with 60-70% stenosis.  Patient denies any anginal symptoms. -Echocardiogram for above -Continue aspirin for secondary prevention (Once no longer NPO-increased to 325 per neurology) -Starting rosuvastatin 10 mg (once no longer NPO)  -Holding telmisartan, currently allowing for permissive hypertension  Essential hypertension: Chronic. SBP ranging 130-150.  Recently started on arm outpatient by her PCP and additionally takes Norvasc 5 mg.  Preserved renal function. - Holding home Norvasc and telmisartan due to permissive hypertension - Continue to monitor BP  Hypothyroidism: Chronic, stable.  TSH 2.98 in 2017 through our records, likely has had checked since via her PCP.  Reports previous thyroid surgery, now takes levothyroxine 50 mcg daily with compliance.  Asymptomatic. - Obtain  TSH  as above - Continue Synthroid once taking p.o.  Moderate protein calorie malnutrition:  Albumin 2.6, total protein 4.9.  Malnourished in appearance, poor dentition. - Nutrition consult - Encourage oral hydration pending swallow study  FEN/GI: N.p.o., pending swallow evaluation, heart healthy afterwards Prophylaxis: Lovenox  Disposition: Admit to medical telemetry, attending Dr. McDiarmid   History of Present Illness:  Phyllis Caldwell is a 78 y.o. female presenting with new onset dysarthria, facial droop, and perceived tongue numbness/thickening that began suddenly around 830 this morning while talking with her grandson.  History provided by the patient, no family at bedside at this time.  She states she was of her normal health prior to the symptoms.  She additionally reports a history of these symptoms in the past, 2 similar episodes within the last 1.5 years but all resolved after few hours without any intervention.  However, this is the first time she has come to the hospital to be evaluated.  She initially noticed her tongue feeling thicker and numb, subsequently making her speech different.  She however did not really notice a change in her speech when this was happening, however grandson told her it was severe.  She also reports a "ice pick" stabbing feeling above her right eye, but states this is chronic and has occurred prior to onset of the symptoms.  Quit smoking 1 year ago.  Recently started on a new hypertensive medication.  Previously tried atorvastatin and Crestor, had myalgias with both, but is willing to retry.  Denies any acute visual changes, extremity weakness, change in sensation, recent illness/fever, palpitations.  Does endorse frequent shortness of breath with coughing, does not take anything for her COPD.  On evaluation, she feels she is generally back at baseline.  No longer feels like her tongue is swollen or numb.  ED course: On arrival, she was hemodynamically stable  and in no acute distress.  Due to her dysarthria, a code stroke was called.  However, upon evaluation by neurology NP, the code was de-escalated to a code TIA.  CT head performed not showing any acute hemorrhage or intracranial findings.  Additionally, MRA/MRI head performed not showing any acute or subacute infarcts.  She will be admitted for further TIA work-up.   Review Of Systems: Per HPI with the following additions:   Review of Systems  Constitutional: Negative for chills, fever and weight loss.  HENT: Negative for congestion, hearing loss and sore throat.   Eyes: Negative for blurred vision, double vision and pain.  Respiratory: Positive for cough and shortness of breath. Negative for hemoptysis.   Cardiovascular: Negative for chest pain, palpitations and leg swelling.  Gastrointestinal: Positive for constipation. Negative for abdominal pain, nausea and vomiting.  Genitourinary: Negative for dysuria.  Musculoskeletal: Positive for back pain. Negative for falls.  Neurological: Positive for speech change. Negative for dizziness, sensory change, loss of consciousness and weakness.    Patient Active Problem List   Diagnosis Date Noted  . Cellulitis diffuse, face 09/09/2016  . Poor dentition 09/09/2016  . Thyroid disease   . Hypertension   . COPD (chronic obstructive pulmonary disease) (HCC) 12/30/2013  . Tobacco use 12/30/2013  . CAD in native artery 12/30/2013  . Old MI (myocardial infarction) 12/30/2013  . Essential hypertension 12/30/2013    Past Medical History: Past Medical History:  Diagnosis Date  . Asthma   . Cataract   . COPD (chronic obstructive pulmonary disease) (HCC)   . Coronary artery disease   . Depression   .  Hypertension   . Poor dentition   . Thyroid disease   . Tobacco use     Past Surgical History: Past Surgical History:  Procedure Laterality Date  . ABDOMINAL HYSTERECTOMY    . LEFT HEART CATHETERIZATION WITH CORONARY ANGIOGRAM N/A 10/07/2012    Procedure: LEFT HEART CATHETERIZATION WITH CORONARY ANGIOGRAM;  Surgeon: Robynn Pane, MD;  Location: [facility] CATH LAB;  Service: Cardiovascular;  Laterality: N/A;  . THYROID SURGERY      Social History: Social History   Tobacco Use  . Smoking status: Former Smoker    Packs/day: 1.00    Types: Cigarettes    Last attempt to quit: 11/01/2017    Years since quitting: 1.2  . Smokeless tobacco: Never Used  Substance Use Topics  . Alcohol use: No  . Drug use: No   Additional social history: Lives with grandson and granddaughter.  Granddaughter is a Lawyer.  Able to do most of her ADLs on her own, granddaughter does aid occasionally. Please also refer to relevant sections of EMR.  Family History: Family History  Problem Relation Age of Onset  . Heart disease Mother   . Heart attack Mother   . Stroke Mother   . Heart attack Father   . Prostate cancer Father     Allergies and Medications: Allergies  Allergen Reactions  . Coconut Fatty Acids     unknown  . Codeine Nausea And Vomiting  . Penicillins     Patient doesn't remember  . Sulfa Antibiotics     unknown   No current facility-administered medications on file prior to encounter.    Current Outpatient Medications on File Prior to Encounter  Medication Sig Dispense Refill  . amLODipine (NORVASC) 5 MG tablet Take 1 tablet (5 mg total) by mouth daily. Reported on 01/05/2016 30 tablet 0  . aspirin EC 81 MG tablet Take 81 mg by mouth daily.    . Atorvastatin Calcium (LIPITOR PO) Take 1 tablet by mouth daily.    . cholecalciferol (VITAMIN D) 1000 units tablet Take 1,000 Units by mouth daily.    . ciprofloxacin (CIPRO) 500 MG tablet Take 500 mg by mouth once.    . diphenhydrAMINE (BENADRYL) 25 MG tablet Take 1 tablet (25 mg total) by mouth every 6 (six) hours. 30 tablet 0  . famotidine (PEPCID) 20 MG tablet Take 1 tablet (20 mg total) by mouth 2 (two) times daily. 30 tablet 0  . levothyroxine (SYNTHROID, LEVOTHROID) 50 MCG tablet  Take 1 tablet (50 mcg total) by mouth daily before breakfast. Reported on 01/05/2016 30 tablet 0  . pantoprazole (PROTONIX) 40 MG tablet Take 1 tablet (40 mg total) by mouth daily at 12 noon. 30 tablet 3  . rosuvastatin (CRESTOR) 10 MG tablet Take 1 tablet (10 mg total) by mouth daily. 90 tablet 3    Objective: BP (!) 137/94   Pulse (!) 48   Temp 97.8 F (36.6 C) (Oral)   Resp 14   Ht 5\' 6"  (1.676 m)   Wt 77 kg   SpO2 98%   BMI 27.40 kg/m  Exam: General: Alert, NAD, laying down on her side in the bed comfortably, thin HEENT: NCAT, MMM, oropharynx nonerythematous, EOMI, PERRLA.  Tongue appropriate in size.  Airway patent.  Poor dentition, several teeth missing. Cardiac: RRR no m/g/r Lungs: Clear bilaterally, no increased WOB, able to speak in full sentences, satting well on RA. Abdomen: soft, non-tender, non-distended, normoactive BS Msk: Moves all extremities spontaneously Neuro: Alert and oriented.  No facial droop noted.  Speech coherent and appropriate, no slurring.  Able to answer questions appropriately.  CN II through XII intact.  Sensation to light touch intact in bilateral upper and lower extremity.  5/5 muscle strength in upper and lower extremity bilaterally.  2/4 biceps reflexes bilaterally.  Heel-to-shin intact bilaterally. Ext: Warm, dry, 2+ distal pulses, no edema Skin: No rashes or lesions noted.   Labs and Imaging: CBC BMET  Recent Labs  Lab 01/26/19 1020  WBC 5.6  HGB 11.8*  HCT 37.3  PLT 252   Recent Labs  Lab 01/26/19 1020 01/26/19 1026  NA 143  --   K 3.0*  --   CL 116*  --   CO2 20*  --   BUN 24*  --   CREATININE 0.80 0.90  GLUCOSE 69*  --   CALCIUM 7.6*  --      Ct Head Code Stroke Wo Contrast  Result Date: 01/26/2019 CLINICAL DATA:  Code stroke. Acute presentation with slurred speech and dizziness. EXAM: CT HEAD WITHOUT CONTRAST TECHNIQUE: Contiguous axial images were obtained from the base of the skull through the vertex without intravenous  contrast. COMPARISON:  09/09/2016.  08/11/2015. FINDINGS: Brain: Chronic small-vessel ischemic changes affecting the pons in present throughout the cerebral hemispheric deep and subcortical white matter, progressive since 2016. No sign of acute infarction, mass lesion, hemorrhage, hydrocephalus or extra-axial collection. Vascular: There is atherosclerotic calcification of the major vessels at the base of the brain. Skull: Negative Sinuses/Orbits: Clear/normal Other: None ASPECTS (Alberta Stroke Program Early CT Score) - Ganglionic level infarction (caudate, lentiform nuclei, internal capsule, insula, M1-M3 cortex): 7 - Supraganglionic infarction (M4-M6 cortex): 3 Total score (0-10 with 10 being normal): 10 IMPRESSION: 1. No acute finding by CT. Atrophy and extensive chronic small-vessel ischemic changes, progressive since 2016. 2. ASPECTS is 10. 3. These results were communicated to Dr. Laurence Slate at 10:37 amon 2/16/2020by text page via the [facility] messaging system. Electronically Signed   By: Paulina Fusi M.D.   On: 01/26/2019 10:38    I have seen and evaluated the patient with Dr. Annia Friendly. I am in agreement with the note above in its revised form. My additions are in blue.  Lovena Neighbours, MD Family Medicine, PGY-3    Allayne Stack, DO 01/26/2019, 1:25 PM PGY-1, [facility] Family Medicine FPTS Intern pager: [phone], text pages welcome

## 2019-01-26 NOTE — Consult Note (Addendum)
NEURO HOSPITALIST  CONSULT   Requesting Physician: Dr. Dalene Seltzer    Chief Complaint: slurred speech  History obtained from:  Patient     HPI:                                                                                                                                         Phyllis Caldwell is an 78 y.o. female  With PMH of CAD, COPD, HTN, tobacco use who presented to [facility] ED with tongue swelling and dizziness. Code stroke was initiated in ED for slurred speech. Code stroke was downgraded to a TIA alert.    No prior stroke history. Per patient/ family she was out in the yard talking and suddenly stopped. When she started talking again family states that her speech was slurred. This lasted less than an hour. Patient states that she felt like her tongue was swollen. Patient denies dizziness, vision changes, CP, SOB, one side weakness.  She states that she is not always compliant with her BP medication. As of Thursday was started on a new medication for BP telmisartan 20 mg. Has not yet taken that today. Per family patient's speech is back to baseline.   ED course:  CTH: no hemorrhage      Date last known well: 01-26-2019 Time last known well: 0830 tPA Given: No:too mild to treat Modified Rankin: Rankin Score=0 NIHSS:2; dysarthria; drift   Past Medical History:  Diagnosis Date  . Asthma   . Cataract   . COPD (chronic obstructive pulmonary disease) (HCC)   . Coronary artery disease   . Depression   . Hypertension   . Poor dentition   . Thyroid disease   . Tobacco use     Past Surgical History:  Procedure Laterality Date  . ABDOMINAL HYSTERECTOMY    . LEFT HEART CATHETERIZATION WITH CORONARY ANGIOGRAM N/A 10/07/2012   Procedure: LEFT HEART CATHETERIZATION WITH CORONARY ANGIOGRAM;  Surgeon: Robynn Pane, MD;  Location: [facility] CATH LAB;  Service: Cardiovascular;  Laterality: N/A;  . THYROID SURGERY      Family History  Problem  Relation Age of Onset  . Heart disease Mother   . Heart attack Mother   . Stroke Mother   . Heart attack Father   . Prostate cancer Father        Social History:  reports that she has been smoking. She has never used smokeless tobacco. She reports that she does not drink alcohol or use drugs.  Allergies:  Allergies  Allergen Reactions  . Coconut Fatty Acids     unknown  . Codeine Nausea And Vomiting  . Penicillins  Patient doesn't remember  . Sulfa Antibiotics     unknown    Medications:                                                                                                                           No current facility-administered medications for this encounter.    Current Outpatient Medications  Medication Sig Dispense Refill  . amLODipine (NORVASC) 5 MG tablet Take 1 tablet (5 mg total) by mouth daily. Reported on 01/05/2016 30 tablet 0  . aspirin EC 81 MG tablet Take 81 mg by mouth daily.    . Atorvastatin Calcium (LIPITOR PO) Take 1 tablet by mouth daily.    . cholecalciferol (VITAMIN D) 1000 units tablet Take 1,000 Units by mouth daily.    . ciprofloxacin (CIPRO) 500 MG tablet Take 500 mg by mouth once.    . diphenhydrAMINE (BENADRYL) 25 MG tablet Take 1 tablet (25 mg total) by mouth every 6 (six) hours. 30 tablet 0  . famotidine (PEPCID) 20 MG tablet Take 1 tablet (20 mg total) by mouth 2 (two) times daily. 30 tablet 0  . levothyroxine (SYNTHROID, LEVOTHROID) 50 MCG tablet Take 1 tablet (50 mcg total) by mouth daily before breakfast. Reported on 01/05/2016 30 tablet 0  . pantoprazole (PROTONIX) 40 MG tablet Take 1 tablet (40 mg total) by mouth daily at 12 noon. 30 tablet 3  . rosuvastatin (CRESTOR) 10 MG tablet Take 1 tablet (10 mg total) by mouth daily. 90 tablet 3      ROS:                                                                                                                                       ROS was performed and is negative except as noted in  HPI  General Examination:  Blood pressure (!) 138/91.  HEENT-  Normocephalic, no lesions, without obvious abnormality.  Normal external eye and conjunctiva. Cardiovascular- S1-S2 audible, pulses palpable throughout  Lungs-no rhonchi or wheezing noted, no excessive working breathing.  Saturations within normal limits on RA Abdomen- All 4 quadrants palpated and non tender Extremities- Warm, dry and intact Musculoskeletal-no joint tenderness, deformity or swelling Skin-warm and dry, no hyperpigmentation, vitiligo, or suspicious lesions  Neurological Examination Mental Status: Alert, oriented, thought content appropriate.  Speech fluent without evidence of aphasia.  Able to follow  commands without difficulty. Cranial Nerves: II:  Visual fields grossly normal,  III,IV, VI: ptosis not present, extra-ocular motions intact bilaterally, pupils equal, round, reactive to light and accommodation V,VII: smile symmetric, facial light touch sensation normal bilaterally VIII: hearing normal bilaterally IX,X: uvula rises midline XI: bilateral shoulder shrug XII: midline tongue extension Motor: Right : Upper extremity   5/5  Left:     Upper extremity   5/5  Lower extremity   5/5   Lower extremity   5/5 Tone and bulk:normal tone throughout; no atrophy noted Sensory:light touch intact throughout, bilaterally Deep Tendon Reflexes: 2+ and symmetric biceps and patella Plantars: Right: downgoing   Left: downgoing Cerebellar: normal finger-to-nose, normal heel-to-shin test Gait: deferred   Lab Results: Basic Metabolic Panel: Recent Labs  Lab 01/26/19 1026  CREATININE 0.90    CBC: Recent Labs  Lab 01/26/19 1020  WBC 5.6  NEUTROABS 3.1  HGB 11.8*  HCT 37.3  MCV 91.9  PLT 252   Imaging: Ct Head Code Stroke Wo Contrast  Result Date: 01/26/2019 CLINICAL DATA:  Code stroke. Acute  presentation with slurred speech and dizziness. EXAM: CT HEAD WITHOUT CONTRAST TECHNIQUE: Contiguous axial images were obtained from the base of the skull through the vertex without intravenous contrast. COMPARISON:  09/09/2016.  08/11/2015. FINDINGS: Brain: Chronic small-vessel ischemic changes affecting the pons in present throughout the cerebral hemispheric deep and subcortical white matter, progressive since 2016. No sign of acute infarction, mass lesion, hemorrhage, hydrocephalus or extra-axial collection. Vascular: There is atherosclerotic calcification of the major vessels at the base of the brain. Skull: Negative Sinuses/Orbits: Clear/normal Other: None ASPECTS (Alberta Stroke Program Early CT Score) - Ganglionic level infarction (caudate, lentiform nuclei, internal capsule, insula, M1-M3 cortex): 7 - Supraganglionic infarction (M4-M6 cortex): 3 Total score (0-10 with 10 being normal): 10 IMPRESSION: 1. No acute finding by CT. Atrophy and extensive chronic small-vessel ischemic changes, progressive since 2016. 2. ASPECTS is 10. 3. These results were communicated to Dr. Laurence Slate at 10:37 amon 2/16/2020by text page via the [facility] messaging system. Electronically Signed   By: Paulina Fusi M.D.   On: 01/26/2019 10:38       Valentina Lucks, MSN, NP-C [facility] [phone]  01/26/2019, 10:31 AM   Attending physician note to follow with Assessment and plan .   Assessment: 78 y.o. female With PMH of CAD, COPD, HTN, tobacco use who presented to [facility] ED with slurred speech, sensation of tongue being swollen and  dizziness. Code stroke was initiated in ED for slurred speech. Code stroke was downgraded to a TIA alert.  CTH: no hemorrhage Stroke Risk Factors - hypertension  Possible TIA   Recommendations #Admit for observation  # MRI of the brain without contrast #MRA Head and Carotid doppler  #Transthoracic Echo  # Start patient on ASA 325mg  daily #Start or continue Atorvastatin 40  mg/other high intensity statin # BP goal: permissive HTN upto 220/120 mmHg  # HBAIC and Lipid profile #  Telemetry monitoring # Frequent neuro checks #stroke swallow screen  Please page stroke NP  Or  PA  Or MD from 8am -4 pm  as this patient from this time will be  followed by the stroke.   You can look them up on [email or web address]  Password [email or web address]   NEUROHOSPITALIST ADDENDUM Performed a face to face diagnostic evaluation.   I have reviewed the contents of history and physical exam as documented by PA/ARNP/Resident and agree with above documentation.  I have discussed and formulated the above plan as documented. Edits to the note have been made as needed.  Impression: Patient was stroke alerted after family noticed patient speech was slurred that lasted for approximately an hour.  Her symptoms resolved at the time she came to the ER and currently back to baseline with NIH stroke scale 0.  Given her multiple vascular risk factors I do think that a TIA is high on the differential.  Key exam findings: Normal neurological exam Plan: Would recommend admission for TIA work-up which includes MRI brain, vascular imaging and echocardiogram.  Detailed plan as mentioned above     Georgiana SpinnerSushanth Meggie Laseter MD [facility] [phone]   If 7pm to 7am, please call on call as listed on AMION.

## 2019-01-26 NOTE — Progress Notes (Signed)
Patient arrived to 52w18. A&O x4. Family at bedside. Skin intact. Patient placed on monitor. Nurse will continue to monitor. Phyllis Caldwell

## 2019-01-26 NOTE — ED Notes (Signed)
Patient returned from MRI.

## 2019-01-26 NOTE — ED Notes (Signed)
ED Provider at bedside. 

## 2019-01-26 NOTE — ED Provider Notes (Addendum)
[facility] Provider Note   CSN: [id number] Arrival date & time: 01/26/19  [id number]     History   Chief Complaint Chief Complaint  Patient presents with  . Oral Swelling  . Dizziness    HPI Phyllis Caldwell is a 78 y.o. female with PMH significant for CAD, COPD, asthma, HTN, thyroid disease.  Patient was brought in by EMS with c/o speech change with tongue numbness. She was in her usual state of heath this morning sitting and talking with her grandson at around 8:30am when she suddenly felt that her tongue was numb "felt thick" and her lips as well. She states that en route she developed severe sharp stabbing pain behind her R eye that is now starting to improve. She also feels dizzy and has some blurry vision that is worse in R eye than L but is new this morning. Per EMS was unsteady on her feet. She had no CP, SOB, facial droop, double vision. EMS did not see any tongue or oral edema.  Of note, patient was started on telmisartan recently on Thursday.      Past Medical History:  Diagnosis Date  . Asthma   . Cataract   . COPD (chronic obstructive pulmonary disease) (HCC)   . Coronary artery disease   . Depression   . Hypertension   . Poor dentition   . Thyroid disease   . Tobacco use     Patient Active Problem List   Diagnosis Date Noted  . Cellulitis diffuse, face 09/09/2016  . Poor dentition 09/09/2016  . Thyroid disease   . Hypertension   . COPD (chronic obstructive pulmonary disease) (HCC) 12/30/2013  . Tobacco use 12/30/2013  . CAD in native artery 12/30/2013  . Old MI (myocardial infarction) 12/30/2013  . Essential hypertension 12/30/2013    Past Surgical History:  Procedure Laterality Date  . ABDOMINAL HYSTERECTOMY    . LEFT HEART CATHETERIZATION WITH CORONARY ANGIOGRAM N/A 10/07/2012   Procedure: LEFT HEART CATHETERIZATION WITH CORONARY ANGIOGRAM;  Surgeon: Robynn PaneMohan N Harwani, MD;  [facility];  Service:  Cardiovascular;  Laterality: N/A;  . THYROID SURGERY       OB History   No obstetric history on file.      Home Medications    Prior to Admission medications   Medication Sig Start Date End Date Taking? Authorizing Provider  amLODipine (NORVASC) 5 MG tablet Take 1 tablet (5 mg total) by mouth daily. Reported on 01/05/2016 09/11/16   Clydia LlanoElmahi, Mutaz, MD  aspirin EC 81 MG tablet Take 81 mg by mouth daily.    [provider]  Atorvastatin Calcium (LIPITOR PO) Take 1 tablet by mouth daily.    [provider]  cholecalciferol (VITAMIN D) 1000 units tablet Take 1,000 Units by mouth daily.    [provider]  ciprofloxacin (CIPRO) 500 MG tablet Take 500 mg by mouth once.    [provider]  diphenhydrAMINE (BENADRYL) 25 MG tablet Take 1 tablet (25 mg total) by mouth every 6 (six) hours. 05/09/17   Rise MuLeaphart, Kenneth T, PA-C  famotidine (PEPCID) 20 MG tablet Take 1 tablet (20 mg total) by mouth 2 (two) times daily. 05/09/17   Rise MuLeaphart, Kenneth T, PA-C  levothyroxine (SYNTHROID, LEVOTHROID) 50 MCG tablet Take 1 tablet (50 mcg total) by mouth daily before breakfast. Reported on 01/05/2016 09/11/16   Clydia LlanoElmahi, Mutaz, MD  pantoprazole (PROTONIX) 40 MG tablet Take 1 tablet (40 mg total) by mouth daily at 12  noon. 09/11/16   Clydia Llano, MD  rosuvastatin (CRESTOR) 10 MG tablet Take 1 tablet (10 mg total) by mouth daily. 04/24/17 07/23/17  Rosalio Macadamia, NP    Family History Family History  Problem Relation Age of Onset  . Heart disease Mother   . Heart attack Mother   . Stroke Mother   . Heart attack Father   . Prostate cancer Father     Social History Social History   Tobacco Use  . Smoking status: Current Every Day Smoker  . Smokeless tobacco: Never Used  Substance Use Topics  . Alcohol use: No  . Drug use: No     Allergies   Coconut fatty acids; Codeine; Penicillins; and Sulfa antibiotics   Review of Systems Review of Systems  Eyes: Positive for  visual disturbance (blurry vision).  Respiratory: Negative for chest tightness, shortness of breath, wheezing and stridor.   Cardiovascular: Negative for chest pain.  Gastrointestinal: Negative for abdominal pain, nausea and vomiting.  Neurological: Positive for dizziness, speech difficulty, numbness (of tongue and lips) and headaches. Negative for syncope, facial asymmetry and weakness.     Physical Exam Updated Vital Signs BP (!) 138/91   Physical Exam Constitutional:      General: She is not in acute distress.    Appearance: Normal appearance. She is not ill-appearing or diaphoretic.  HENT:     Head: Normocephalic and atraumatic.     Nose: Nose normal.     Mouth/Throat:     Mouth: Mucous membranes are moist.     Pharynx: Oropharynx is clear.     Comments: Tongue and uvula midline. No edema.  Eyes:     General: No visual field deficit.    Extraocular Movements: Extraocular movements intact.     Conjunctiva/sclera: Conjunctivae normal.     Pupils: Pupils are equal, round, and reactive to light.     Comments: Visual fields intact  Neck:     Musculoskeletal: Normal range of motion.  Cardiovascular:     Rate and Rhythm: Normal rate and regular rhythm.     Heart sounds: Normal heart sounds. No murmur.  Pulmonary:     Effort: Pulmonary effort is normal.     Breath sounds: Normal breath sounds.  Abdominal:     General: Abdomen is flat. Bowel sounds are normal. There is no distension.     Tenderness: There is no abdominal tenderness. There is no guarding.  Musculoskeletal:     Right lower leg: No edema.     Left lower leg: No edema.  Skin:    General: Skin is warm and dry.  Neurological:     Mental Status: She is alert.     Cranial Nerves: Dysarthria present. No facial asymmetry.     Sensory: No sensory deficit.     Motor: No weakness (5/5 strength throughout), abnormal muscle tone or pronator drift.     Coordination: Finger-Nose-Finger Test and Heel to [employer] normal.        ED Treatments / Results  Labs (all labs ordered are listed, but only abnormal results are displayed) Labs Reviewed  CBC - Abnormal; Notable for the following components:      Result Value   Hemoglobin 11.8 (*)    All other components within normal limits  COMPREHENSIVE METABOLIC PANEL - Abnormal; Notable for the following components:   Potassium 3.0 (*)    Chloride 116 (*)    CO2 20 (*)    Glucose, Bld 69 (*)  BUN 24 (*)    Calcium 7.6 (*)    Total Protein 4.9 (*)    Albumin 2.6 (*)    AST 11 (*)    All other components within normal limits  ETHANOL  PROTIME-INR  APTT  DIFFERENTIAL  RAPID URINE DRUG SCREEN, HOSP PERFORMED  URINALYSIS, ROUTINE W REFLEX MICROSCOPIC  I-STAT CREATININE, ED    EKG None  Radiology Ct Head Code Stroke Wo Contrast  Result Date: 01/26/2019 CLINICAL DATA:  Code stroke. Acute presentation with slurred speech and dizziness. EXAM: CT HEAD WITHOUT CONTRAST TECHNIQUE: Contiguous axial images were obtained from the base of the skull through the vertex without intravenous contrast. COMPARISON:  09/09/2016.  08/11/2015. FINDINGS: Brain: Chronic small-vessel ischemic changes affecting the pons in present throughout the cerebral hemispheric deep and subcortical white matter, progressive since 2016. No sign of acute infarction, mass lesion, hemorrhage, hydrocephalus or extra-axial collection. Vascular: There is atherosclerotic calcification of the major vessels at the base of the brain. Skull: Negative Sinuses/Orbits: Clear/normal Other: None ASPECTS (Alberta Stroke Program Early CT Score) - Ganglionic level infarction (caudate, lentiform nuclei, internal capsule, insula, M1-M3 cortex): 7 - Supraganglionic infarction (M4-M6 cortex): 3 Total score (0-10 with 10 being normal): 10 IMPRESSION: 1. No acute finding by CT. Atrophy and extensive chronic small-vessel ischemic changes, progressive since 2016. 2. ASPECTS is 10. 3. These results were communicated to Dr.  Laurence Slate at 10:37 amon 2/16/2020by text page via the [facility] messaging system. Electronically Signed   By: Paulina Fusi M.D.   On: 01/26/2019 10:38    Procedures Procedures (including critical care time)  Medications Ordered in ED Medications - No data to display   Initial Impression / Assessment and Plan / ED Course  I have reviewed the triage vital signs and the nursing notes.  Pertinent labs & imaging results that were available during my care of the patient were reviewed by me and considered in my medical decision making (see chart for details).     Patient last known normal was around 8:30am this morning when she had sudden onset dysarthria. She has no oral edema. She does note some new blurry vision greater with R eye than L. No other deficits as she has normal strength and sensation. EKG NSR.   Code stroke called at 10:12am. Seen by neuro NP, downgraded to code TIA. CT head without hemorrhage or acute findings. MRI/MRA brain pending.  Spoke with [facility]PT[facility] who will admit.   Final Clinical Impressions(s) / ED Diagnoses   Final diagnoses:  Dysarthria    ED Discharge Orders    None        Leland Her, DO 01/26/19 1336    Alvira Monday, MD 01/26/19 2213

## 2019-01-27 ENCOUNTER — Inpatient Hospital Stay (HOSPITAL_BASED_OUTPATIENT_CLINIC_OR_DEPARTMENT_OTHER): Payer: Medicare HMO

## 2019-01-27 DIAGNOSIS — R471 Dysarthria and anarthria: Secondary | ICD-10-CM | POA: Diagnosis not present

## 2019-01-27 DIAGNOSIS — G459 Transient cerebral ischemic attack, unspecified: Secondary | ICD-10-CM | POA: Diagnosis not present

## 2019-01-27 LAB — BASIC METABOLIC PANEL
Anion gap: 7 (ref 5–15)
BUN: 28 mg/dL — ABNORMAL HIGH (ref 8–23)
CO2: 23 mmol/L (ref 22–32)
Calcium: 9.1 mg/dL (ref 8.9–10.3)
Chloride: 110 mmol/L (ref 98–111)
Creatinine, Ser: 1.05 mg/dL — ABNORMAL HIGH (ref 0.44–1.00)
GFR calc Af Amer: 59 mL/min — ABNORMAL LOW (ref >60)
GFR calc non Af Amer: 51 mL/min — ABNORMAL LOW (ref >60)
Glucose, Bld: 100 mg/dL — ABNORMAL HIGH (ref 70–99)
Potassium: 4.4 mmol/L (ref 3.5–5.1)
Sodium: 140 mmol/L (ref 135–145)

## 2019-01-27 LAB — LIPID PANEL
Cholesterol: 194 mg/dL (ref 0–200)
HDL: 40 mg/dL — ABNORMAL LOW (ref >40)
LDL Cholesterol: 133 mg/dL — ABNORMAL HIGH (ref 0–99)
Total CHOL/HDL Ratio: 4.9 RATIO
Triglycerides: 103 mg/dL (ref <150)
VLDL: 21 mg/dL (ref 0–40)

## 2019-01-27 LAB — ECHOCARDIOGRAM COMPLETE
Height: 66 in
Weight: 2716.07 oz

## 2019-01-27 LAB — TSH: TSH: 2.162 u[IU]/mL (ref 0.350–4.500)

## 2019-01-27 MED ORDER — COENZYME Q-10 100 MG PO CAPS: 100.0000 mg | ORAL_CAPSULE | Freq: Every day | ORAL | 0 refills | Status: DC

## 2019-01-27 MED ORDER — SODIUM CHLORIDE 0.9 % IV SOLN: INTRAVENOUS | Status: DC

## 2019-01-27 MED ORDER — ASPIRIN 325 MG PO TABS: 325.0000 mg | ORAL_TABLET | Freq: Every day | ORAL | 0 refills | Status: DC

## 2019-01-27 NOTE — Evaluation (Signed)
Physical Therapy Evaluation Patient Details Name: Phyllis Caldwell MRN: [id number] DOB: May 15, 1941 Today's Date: 01/27/2019   History of Present Illness  Phyllis Caldwell is a 78 y.o. female presenting with new onset dysarthria, perceived tongue numbness/swelling, and facial droop this morning around 8:30 AM, that is concerning for TIA vs stroke. PMH is significant for COPD, tobacco use, CAD with previous MI, essential hypertension, thyroid disease, poor dentition  Clinical Impression  Pt is at or close to baseline functioning and should be safe at home alone while family works.  Pt is deconditioned and could benefit from reconditioning, but feels she can build up her own strength.. There are no further acute PT needs.  Will sign off at this time.     Follow Up Recommendations Home health PT;Other (comment)(pt has implied that she will refuse HHPT)    Equipment Recommendations  None recommended by PT    Recommendations for Other Services       Precautions / Restrictions Precautions Precautions: Fall      Mobility  Bed Mobility Overal bed mobility: Modified Independent                Transfers Overall transfer level: Needs assistance Equipment used: Straight cane Transfers: Sit to/from Stand Sit to Stand: Supervision         General transfer comment: struggled getting up from a lower surface, but used adequate transfer safety  Ambulation/Gait Ambulation/Gait assistance: Min Gaffer (Feet): 160 Feet Assistive device: Straight cane Gait Pattern/deviations: Step-through pattern   Gait velocity interpretation: <1.8 ft/sec, indicate of risk for recurrent falls General Gait Details: mildly unsteady overall.  Safe in a homelike environment with a cane though she reports not using the can in the home.  Uses the cane in a unsequenced method, but does use it.  Stairs            Wheelchair Mobility    Modified Rankin (Stroke Patients Only)       Balance Overall balance assessment: Needs assistance   Sitting balance-Leahy Scale: Good       Standing balance-Leahy Scale: Fair                               Pertinent Vitals/Pain Pain Assessment: Faces Faces Pain Scale: Hurts a little bit Pain Location: joint pain on MMT Pain Descriptors / Indicators: Discomfort Pain Intervention(s): Monitored during session    Home Living Family/patient expects to be discharged to:: Private residence Living Arrangements: Other relatives(2 grandchildren live with her) Available Help at Discharge: Family;Available PRN/intermittently Type of Home: House Home Access: Stairs to enter Entrance Stairs-Rails: Psychiatric nurse of Steps: several Home Layout: Two level;Bed/bath upstairs Home Equipment: Cane - single point      Prior Function Level of Independence: Independent with assistive device(s)         Comments: struggled with the stairs, grocery shopping was taxing, but pt managed with friend taking her.  pt reports able to do her ADL's and does the cooking.     Hand Dominance        Extremity/Trunk Assessment   Upper Extremity Assessment Upper Extremity Assessment: Defer to OT evaluation    Lower Extremity Assessment Lower Extremity Assessment: Overall WFL for tasks assessed(mild weakness overall)       Communication   Communication: No difficulties  Cognition Arousal/Alertness: Awake/alert Behavior During Therapy: WFL for tasks assessed/performed Overall Cognitive Status: Within Functional Limits for tasks assessed(NT  formally)                                        General Comments      Exercises     Assessment/Plan    PT Assessment All further PT needs can be met in the next venue of care  PT Problem List Decreased activity tolerance;Decreased balance;Decreased mobility       PT Treatment Interventions      PT Goals (Current goals can be found in the  Care Plan section)  Acute Rehab PT Goals Patient Stated Goal: Home PT Goal Formulation: All assessment and education complete, DC therapy    Frequency     Barriers to discharge        Co-evaluation               AM-PAC PT "6 Clicks" Mobility  Outcome Measure Help needed turning from your back to your side while in a flat bed without using bedrails?: None Help needed moving from lying on your back to sitting on the side of a flat bed without using bedrails?: None Help needed moving to and from a bed to a chair (including a wheelchair)?: None Help needed standing up from a chair using your arms (e.g., wheelchair or bedside chair)?: None Help needed to walk in hospital room?: A Little Help needed climbing 3-5 steps with a railing? : A Little 6 Click Score: 22    End of Session   Activity Tolerance: Patient tolerated treatment well Patient left: in bed Nurse Communication: Mobility status PT Visit Diagnosis: Unsteadiness on feet (R26.81)    Time: 1117-3567 PT Time Calculation (min) (ACUTE ONLY): 26 min   Charges:   PT Evaluation $PT Eval Low Complexity: 1 Low PT Treatments $Gait Training: 8-22 mins        01/27/2019  Donnella Sham, PT Acute Rehabilitation Services [phone]  (pager) [phone]  (office)01/27/2019  Donnella Sham, PT Acute [place] [phone]  (pager) [phone]  (office)  Tessie Fass Shekela Goodridge 01/27/2019, 11:36 AM

## 2019-01-27 NOTE — Progress Notes (Signed)
Echo results receive via fax.  MD notified.  Echo results put into pt's chart as Epic is not showing result.

## 2019-01-27 NOTE — Progress Notes (Signed)
Discharge paper work review with patient and all questions answer.  VSS.  Pt is alert and oriented.  Pt discharge via wheelchair.

## 2019-01-27 NOTE — Progress Notes (Signed)
  Echocardiogram 2D Echocardiogram has been performed.  Phyllis Caldwell 01/27/2019, 9:42 AM

## 2019-01-27 NOTE — Care Management CC44 (Signed)
Condition Code 44 Documentation Completed  Patient Details  Name: Phyllis Caldwell MRN: [id number] Date of Birth: 06/17/1941   Condition Code 44 given:  Yes Patient signature on Condition Code 44 notice:  Yes Documentation of 2 MD's agreement:  Yes Code 44 added to claim:  Yes    Kermit Balo, RN 01/27/2019, 5:12 PM

## 2019-01-27 NOTE — Plan of Care (Signed)
  Problem: Education: Goal: Knowledge of secondary prevention will improve Outcome: Progressing   Problem: Education: Goal: Individualized Educational Video(s) Outcome: Progressing   Problem: Coping: Goal: Will verbalize positive feelings about self Outcome: Progressing

## 2019-01-27 NOTE — Progress Notes (Addendum)
STROKE TEAM PROGRESS NOTE   INTERVAL HISTORY No family is at the bedside.  She recounted onset of difficulty speaking with tongue heaviness. Family called 911.  Vitals:   01/27/19 0711 01/27/19 0800 01/27/19 0900 01/27/19 1000  BP: (!) 150/86     Pulse: (!) 30 (!) 52 (!) 52 (!) 53  Resp: 14 20 (!) 23 16  Temp: 98.2 F (36.8 C)     TempSrc: Oral     SpO2: 97% 100% 98% 100%  Weight:      Height:        CBC:  Recent Labs  Lab 01/26/19 1020 01/26/19 1845  WBC 5.6 5.6  NEUTROABS 3.1  --   HGB 11.8* 12.1  HCT 37.3 37.3  MCV 91.9 89.9  PLT 252 247    Basic Metabolic Panel:  Recent Labs  Lab 01/26/19 1020  01/26/19 1845 01/27/19 0606  NA 143  --   --  140  K 3.0*  --   --  4.4  CL 116*  --   --  110  CO2 20*  --   --  23  GLUCOSE 69*  --   --  100*  BUN 24*  --   --  28*  CREATININE 0.80   < > 1.03* 1.05*  CALCIUM 7.6*  --   --  9.1   < > = values in this interval not displayed.   Lipid Panel:     Component Value Date/Time   CHOL 194 01/27/2019 0606   CHOL 181 04/23/2017 1404   TRIG 103 01/27/2019 0606   HDL 40 (L) 01/27/2019 0606   HDL 50 04/23/2017 1404   CHOLHDL 4.9 01/27/2019 0606   VLDL 21 01/27/2019 0606   LDLCALC 133 (H) 01/27/2019 0606   LDLCALC 115 (H) 04/23/2017 1404   HgbA1c: No results found for: HGBA1C Urine Drug Screen:     Component Value Date/Time   LABOPIA NONE DETECTED 01/26/2019 1531   COCAINSCRNUR NONE DETECTED 01/26/2019 1531   LABBENZ NONE DETECTED 01/26/2019 1531   AMPHETMU NONE DETECTED 01/26/2019 1531   THCU NONE DETECTED 01/26/2019 1531   LABBARB NONE DETECTED 01/26/2019 1531    Alcohol Level     Component Value Date/Time   ETH <10 01/26/2019 1020     PHYSICAL EXAM Frail elderly Caucasian lady not in distress. . Afebrile. Head is nontraumatic. Neck is supple without bruit.    Cardiac exam no murmur or gallop. Lungs are clear to auscultation. Distal pulses are well felt.  Neurological Exam ;  Awake  Alert oriented x 3.  Normal speech and language.eye movements full without nystagmus.fundi were not visualized. Vision acuity and fields appear normal. Hearing is normal. Palatal movements are normal. Face symmetric. Tongue midline. Normal strength, tone, reflexes and coordination. Normal sensation. Gait deferred.  ASSESSMENT/PLAN Ms. Phyllis Caldwell is a 78 y.o. female with history of CAD, COPD, HTN, tobacco use presenting with slurred speech.   Possible L brain TIA  Code Stroke CT head No acute stroke. Progressive Small vessel disease since 2016. ASPECTS 10.     MRI  No acute stroke. Extensive Small vessel disease.  MRA  No large or medium vessel occlusion. ? R A1, R V4 stenosis.   Carotid Doppler  B ICA 1-39% stenosis, VAs antegrade   2D Echo  pending   LDL 133  HgbA1c pending   Lovenox 40 mg sq daily for VTE prophylaxis  aspirin 81 mg daily prior to admission, now on aspirin 325  mg daily. Will not treat with DAPT because not 100% convinced it is a TIA  Therapy recommendations:  HH PT and OT  Disposition:  pending   Hypertension  Stable . BP goal normotensive  Hyperlipidemia  Home meds:  Has tried lipitor and crestor 10 in the past but intolerant d/t muscle issues  recommend coQ10 along with lower dose crestor at d/c  LDL 133, goal < 70  Continue statin at discharge  Other Stroke Risk Factors  Advanced age  Family hx stroke (m[place])  Coronary artery disease, MI, PCI  Other Active Problems  COPD  Hypothyroidism  Protein calorie malnutrition, poor dentition  Hospital day # 1  Annie Main, MSN, APRN, ANVP-BC, AGPCNP-BC Advanced Practice Stroke Nurse [facility] See Amion for Schedule & Pager information 01/27/2019 11:15 AM  I have personally obtained history,examined this patient, reviewed notes, independently viewed imaging studies, participated in medical decision making and plan of care.ROS completed by me personally and pertinent positives fully  documented  I have made any additions or clarifications directly to the above note. Agree with note above. She presented with nonspecific symptoms of difficulty speaking with the heaviness of her tongue without clear-cut focal symptoms. Continue ongoing stroke workup. Change aspirin from 81 to 325 mg daily.greater than 50 percent time   during this 25 minute visit was spent on counseling and coordination of care about TIA and stroke prevention and treatment and answered questions.  Delia Heady, MD Medical Director [facility] Pager: [phone] 01/27/2019 4:47 PM  To contact Stroke Continuity provider, please refer to [email or web address]. After hours, contact General Neurology

## 2019-01-27 NOTE — Progress Notes (Signed)
Family Medicine Teaching Service Daily Progress Note Intern Pager: [phone]  Patient name: Phyllis Caldwell Medical record number: [id number] Date of birth: August 25, 1941 Age: 78 y.o. Gender: female  Primary Care Provider: Mila Palmer, MD Consultants: Neuro Code Status: Full  Pt Overview and Major Events to Date:  Admitted 2/16   Assessment and Plan: Phyllis Caldwell is a 78 y.o. female presenting with new onset dysarthria, perceived tongue numbness/swelling, and facial droop this morning around 8:30 AM, that is concerning for TIA vs stroke. PMH is significant for COPD, tobacco use, CAD with previous MI, essential hypertension, thyroid disease, poor dentition.  Dysarthria, tongue numbness, facial droop c/f TIA: Acute, resolved.  Continues to be back at baseline.  Neurologically intact.  LDL 133.  A1c and TSH still pending. - Neurology following, appreciate any additional recommendations - Follow-up A1c, TSH - Follow-up echocardiogram and carotid Doppler - Continue aspirin 325, Crestor 10 - PT/OT evaluation - Monitor vitals per routine - Continue to allow for permissive hypertension  COPD: Chronic, uncontrolled. Almost daily reports of shortness of breath and coughing, not currently on any medications.  Comfortable on exam, satting well on RA. - Albuterol as needed - Continued outpatient work-up with PFTs, consider [place] therapy initiation  CAD with previous MI, w/o PCI: Chronic.  No anginal symptoms. - Aspirin 325 -Rosuvastatin 10 mg - Continue to hold telmisartan to allow for permissive hypertension and elevated creatinine - Follow-up echo  Essential hypertension: Chronic. SBP ranging 120-150.   - Holding home Norvasc 5mg  and telmisartan due to permissive hypertension - Continue to monitor BP  Hypothyroidism: Chronic, stable.  TSH 2.98 in 2017. - Follow-up TSH - Continue Synthroid 50 mcg  Moderate protein calorie malnutrition:  Albumin 2.6, total protein 4.9.   Malnourished in appearance, poor dentition. - Nutrition consult - Encouraged oral hydration  FEN/GI:  Heart healthy Prophylaxis: Lovenox  Disposition:  Potential discharge this afternoon or tomorrow, pending PT/OT evaluation  Subjective:  Doing well this morning, no complaints.  She has had no further "ice pick "on her right eye and does not have any difficulty with her speech.  Eating well, drinking plenty of water.  Denies any chest pain, shortness of breath, weakness/numbness anywhere.  Objective: Temp:  [97.8 F (36.6 C)-98.2 F (36.8 C)] 98.2 F (36.8 C) (02/17 0711) Pulse Rate:  [30-68] 52 (02/17 0800) Resp:  [14-20] 20 (02/17 0800) BP: (121-157)/(68-95) 150/86 (02/17 0711) SpO2:  [96 %-100 %] 100 % (02/17 0800) Weight:  [77 kg] 77 kg (02/16 1116) Physical Exam: General: Alert, NAD, thin older female HEENT: NCAT, MMM, tongue appropriate size. Cardiac: RRR no m/g/r Lungs: Clear bilaterally, no increased WOB  Abdomen: soft, non-tender, non-distended, normoactive BS Msk: Moves all extremities spontaneously  Ext: Warm, dry, 2+ distal pulses, no edema  Neuro: No focal neuro deficits noted, alert and oriented.  Speech appropriate and understandable.  Able to have conversation.  EOMI.  Laboratory: Recent Labs  Lab 01/26/19 1020 01/26/19 1845  WBC 5.6 5.6  HGB 11.8* 12.1  HCT 37.3 37.3  PLT 252 247   Recent Labs  Lab 01/26/19 1020 01/26/19 1026 01/26/19 1845 01/27/19 0606  NA 143  --   --  140  K 3.0*  --   --  4.4  CL 116*  --   --  110  CO2 20*  --   --  23  BUN 24*  --   --  28*  CREATININE 0.80 0.90 1.03* 1.05*  CALCIUM 7.6*  --   --  9.1  PROT 4.9*  --   --   --   BILITOT 0.5  --   --   --   ALKPHOS 59  --   --   --   ALT 10  --   --   --   AST 11*  --   --   --   GLUCOSE 69*  --   --  100*     Imaging/Diagnostic Tests: Mr Shirlee Latch KV Contrast  Result Date: 01/26/2019 CLINICAL DATA:  Acute presentation with slurred speech. EXAM: MRI HEAD WITHOUT  CONTRAST MRA HEAD WITHOUT CONTRAST TECHNIQUE: Multiplanar, multiecho pulse sequences of the brain and surrounding structures were obtained without intravenous contrast. Angiographic images of the head were obtained using MRA technique without contrast. COMPARISON:  CT earlier same day. FINDINGS: Both studies suffer from motion degradation. MRI HEAD FINDINGS Brain: Diffusion imaging does not show any acute or subacute infarction. There chronic small-vessel ischemic changes of the pons. No focal cerebellar insult. Cerebral hemispheres show pronounced chronic small-vessel ischemic changes of the deep and subcortical white matter. No cortical or large vessel territory infarction. No mass lesion, hemorrhage, hydrocephalus or extra-axial collection. Vascular: Major vessels at the base of the brain show flow. Skull and upper cervical spine: Negative Sinuses/Orbits: Clear/normal Other: None MRA HEAD FINDINGS Both internal carotid arteries are patent through the skull base and siphon regions. The anterior and middle cerebral vessels are patent. There is either a right A1 stenosis or artifactual signal loss. Both vertebral arteries are patent to the basilar. There is a 50% stenosis of the right V4 segment. No stenosis on the left. No basilar stenosis. Posterior circulation branch vessels show flow. Mild ectasia of the basilar tip. IMPRESSION: Motion degraded exams. Brain does not show any acute or subacute infarction. There are extensive chronic small-vessel ischemic changes of the cerebral hemispheric white matter. No large or medium vessel intracranial occlusion. Question right A1 stenosis. Question right V4 stenosis. Electronically Signed   By: Paulina Fusi M.D.   On: 01/26/2019 13:53   Mr Brain Wo Contrast  Result Date: 01/26/2019 CLINICAL DATA:  Acute presentation with slurred speech. EXAM: MRI HEAD WITHOUT CONTRAST MRA HEAD WITHOUT CONTRAST TECHNIQUE: Multiplanar, multiecho pulse sequences of the brain and  surrounding structures were obtained without intravenous contrast. Angiographic images of the head were obtained using MRA technique without contrast. COMPARISON:  CT earlier same day. FINDINGS: Both studies suffer from motion degradation. MRI HEAD FINDINGS Brain: Diffusion imaging does not show any acute or subacute infarction. There chronic small-vessel ischemic changes of the pons. No focal cerebellar insult. Cerebral hemispheres show pronounced chronic small-vessel ischemic changes of the deep and subcortical white matter. No cortical or large vessel territory infarction. No mass lesion, hemorrhage, hydrocephalus or extra-axial collection. Vascular: Major vessels at the base of the brain show flow. Skull and upper cervical spine: Negative Sinuses/Orbits: Clear/normal Other: None MRA HEAD FINDINGS Both internal carotid arteries are patent through the skull base and siphon regions. The anterior and middle cerebral vessels are patent. There is either a right A1 stenosis or artifactual signal loss. Both vertebral arteries are patent to the basilar. There is a 50% stenosis of the right V4 segment. No stenosis on the left. No basilar stenosis. Posterior circulation branch vessels show flow. Mild ectasia of the basilar tip. IMPRESSION: Motion degraded exams. Brain does not show any acute or subacute infarction. There are extensive chronic small-vessel ischemic changes of the cerebral hemispheric white matter. No large or medium vessel  intracranial occlusion. Question right A1 stenosis. Question right V4 stenosis. Electronically Signed   By: Paulina FusiMark  Shogry M.D.   On: 01/26/2019 13:53   Ct Head Code Stroke Wo Contrast  Result Date: 01/26/2019 CLINICAL DATA:  Code stroke. Acute presentation with slurred speech and dizziness. EXAM: CT HEAD WITHOUT CONTRAST TECHNIQUE: Contiguous axial images were obtained from the base of the skull through the vertex without intravenous contrast. COMPARISON:  09/09/2016.  08/11/2015.  FINDINGS: Brain: Chronic small-vessel ischemic changes affecting the pons in present throughout the cerebral hemispheric deep and subcortical white matter, progressive since 2016. No sign of acute infarction, mass lesion, hemorrhage, hydrocephalus or extra-axial collection. Vascular: There is atherosclerotic calcification of the major vessels at the base of the brain. Skull: Negative Sinuses/Orbits: Clear/normal Other: None ASPECTS (Alberta Stroke Program Early CT Score) - Ganglionic level infarction (caudate, lentiform nuclei, internal capsule, insula, M1-M3 cortex): 7 - Supraganglionic infarction (M4-M6 cortex): 3 Total score (0-10 with 10 being normal): 10 IMPRESSION: 1. No acute finding by CT. Atrophy and extensive chronic small-vessel ischemic changes, progressive since 2016. 2. ASPECTS is 10. 3. These results were communicated to Dr. Laurence SlateAroor at 10:37 amon 2/16/2020by text page via the [facility] messaging system. Electronically Signed   By: Paulina FusiMark  Shogry M.D.   On: 01/26/2019 10:38    Allayne StackBeard, Samantha N, DO 01/27/2019, 9:02 AM PGY-1, [facility] FPTS Intern pager: [phone], text pages welcome

## 2019-01-27 NOTE — Evaluation (Signed)
Occupational Therapy Evaluation Patient Details Name: Phyllis Caldwell MRN: [id number] DOB: [birth year] Today's Date: 01/27/2019    History of Present Illness Phyllis Caldwell is a 78 y.o. female presenting with new onset dysarthria, perceived tongue numbness/swelling, and facial droop this morning around 8:30 AM, that is concerning for TIA vs stroke. PMH is significant for COPD, tobacco use, CAD with previous MI, essential hypertension, thyroid disease, poor dentition   Clinical Impression   Pt admitted with above and present to OT with decreased balance and deconditioning however is at or close to baseline.  Pt completed all mobility with personal SPC with distant supervision.  Discussed recommendation for DME for bathroom transfers with pt in agreement to 3 in 1 for toileting.  Pt completed LB dressing and toileting tasks without assistance this session.  Discussed recommendation for f/u OT, with pt reporting prior experience with HHOT/PT.  Pt feels she can return home and build up her own strength.  Does have intermittent supervision from grandchildren that live with her.  There are no further acute OT needs.  Will sign off at this time.    Follow Up Recommendations  Home health OT(Pt reports having a poor experience and will most likely refuse HHOT)    Equipment Recommendations  3 in 1 bedside commode    Recommendations for Other Services       Precautions / Restrictions Precautions Precautions: Fall Restrictions Weight Bearing Restrictions: No      Mobility Bed Mobility Overal bed mobility: Modified Independent                Transfers Overall transfer level: Needs assistance Equipment used: Straight cane Transfers: Sit to/from Stand Sit to Stand: Supervision         General transfer comment: struggled getting up from a lower surface, but used adequate transfer safety    Balance Overall balance assessment: Needs assistance   Sitting balance-Leahy Scale: Good        Standing balance-Leahy Scale: Fair                             ADL either performed or assessed with clinical judgement   ADL Overall ADL's : Modified independent;At baseline                                       General ADL Comments: Pt completed LB dressing without assistance and ambulated to/from bathroom with [facility] with distant supervision and completed toileting without assistance.  Pt reports difficulty getting up from low commode seat at home, therefore therapist educated pt on 3 in 1 over commode to provide UE support and elevate seat height.  Pt in agreement that [facility] may assist with safety and independence at home.     Vision Baseline Vision/History: Wears glasses Wears Glasses: At all times Patient Visual Report: No change from baseline Vision Assessment?: No apparent visual deficits            Pertinent Vitals/Pain Pain Assessment: Faces Faces Pain Scale: Hurts a little bit Pain Location: joint pain on MMT Pain Descriptors / Indicators: Discomfort Pain Intervention(s): Monitored during session     Hand Dominance Right   Extremity/Trunk Assessment Upper Extremity Assessment Upper Extremity Assessment: Overall WFL for tasks assessed   Lower Extremity Assessment Lower Extremity Assessment: Overall WFL for tasks assessed       Communication Communication  Communication: No difficulties   Cognition Arousal/Alertness: Awake/alert Behavior During Therapy: WFL for tasks assessed/performed Overall Cognitive Status: Within Functional Limits for tasks assessed                                                Home Living Family/patient expects to be discharged to:: Private residence Living Arrangements: Other relatives(2 grandchildren live with her) Available Help at Discharge: Family;Available PRN/intermittently Type of Home: House Home Access: Stairs to enter [employer] of Steps: several Entrance  Stairs-Rails: Right;Left Home Layout: Two level;Bed/bath upstairs Alternate Level Stairs-Number of Steps: flight   Bathroom Shower/Tub: Chief Strategy Officer: Standard     Home Equipment: Cane - single point          Prior Functioning/Environment Level of Independence: Independent with assistive device(s)        Comments: struggled with the stairs, grocery shopping was taxing, but pt managed with friend taking her.  pt reports able to do her ADL's and does the cooking.        OT Problem List: Decreased activity tolerance;Impaired balance (sitting and/or standing)         OT Goals(Current goals can be found in the care plan section) Acute Rehab OT Goals Patient Stated Goal: Home OT Goal Formulation: All assessment and education complete, DC therapy  OT Frequency:     Barriers to D/C: Decreased caregiver support             AM-PAC OT "6 Clicks" Daily Activity     Outcome Measure Help from another person eating meals?: None Help from another person taking care of personal grooming?: None Help from another person toileting, which includes using toliet, bedpan, or urinal?: A Little Help from another person bathing (including washing, rinsing, drying)?: A Little Help from another person to put on and taking off regular upper body clothing?: None Help from another person to put on and taking off regular lower body clothing?: None 6 Click Score: 22   End of Session Equipment Utilized During Treatment: [facility]) Nurse Communication: Mobility status  Activity Tolerance: Patient tolerated treatment well Patient left: in bed;with call bell/phone within reach  OT Visit Diagnosis: Unsteadiness on feet (R26.81);Dizziness and giddiness (R42)                Time: 8288-3374 OT Time Calculation (min): 23 min Charges:  OT General Charges $OT Visit: 1 Visit OT Evaluation $OT Eval Low Complexity: 1 Low OT Treatments $Self Care/Home Management : 8-22 mins  Rosalio Loud, [phone] 01/27/2019, 12:27 PM

## 2019-01-27 NOTE — Progress Notes (Signed)
Carotid duplex       has been completed. Preliminary results can be found under CV proc through chart review. Phyllis Caldwell, BS, RDMS, RVT   

## 2019-01-27 NOTE — Care Management Obs Status (Signed)
MEDICARE OBSERVATION STATUS NOTIFICATION   Patient Details  Name: Phyllis Caldwell MRN: [id number] Date of Birth: Aug 04, 1941   Medicare Observation Status Notification Given:  Yes    Kermit Balo, RN 01/27/2019, 5:12 PM

## 2019-01-27 NOTE — Evaluation (Signed)
Speech Language Pathology Evaluation Patient Details Name: Phyllis Caldwell MRN: [id number] DOB: Jan 26, 1941 Today's Date: 01/27/2019 Time: 1202-1217 SLP Time Calculation (min) (ACUTE ONLY): 15 min  Problem List:  Patient Active Problem List   Diagnosis Date Noted  . TIA (transient ischemic attack) 01/26/2019  . Cellulitis diffuse, face 09/09/2016  . Poor dentition 09/09/2016  . Thyroid disease   . Hypertension   . COPD (chronic obstructive pulmonary disease) (HCC) 12/30/2013  . Tobacco use 12/30/2013  . CAD in native artery 12/30/2013  . Old MI (myocardial infarction) 12/30/2013  . Essential hypertension 12/30/2013   Past Medical History:  Past Medical History:  Diagnosis Date  . Asthma   . Cataract   . COPD (chronic obstructive pulmonary disease) (HCC)   . Coronary artery disease   . Depression   . Hypertension   . Poor dentition   . Thyroid disease   . Tobacco use    Past Surgical History:  Past Surgical History:  Procedure Laterality Date  . ABDOMINAL HYSTERECTOMY    . LEFT HEART CATHETERIZATION WITH CORONARY ANGIOGRAM N/A 10/07/2012   Procedure: LEFT HEART CATHETERIZATION WITH CORONARY ANGIOGRAM;  Surgeon: Robynn Pane, MD;  Location: [facility] CATH LAB;  Service: Cardiovascular;  Laterality: N/A;  . THYROID SURGERY     HPI:  Phyllis Caldwell is a 78 y.o. female presenting with new onset dysarthria, perceived tongue numbness/swelling, and facial droop this morning around 8:30 AM, that is concerning for TIA vs stroke. PMH is significant for COPD, tobacco use, CAD with previous MI, essential hypertension, thyroid disease, poor dentition. MRI reveals extensive chronic small-vessel ischemic changes of the cerebral hemispheric white matter.   Assessment / Plan / Recommendation Clinical Impression  Pt presents with functional cognitive linguistic abilites and all dysarthria has resolved. No acute deficits identified. ST to sign off at this time.    Of note, pt expresses  emotional stress within family and home environment that often distracts her from fully participating in life. SLP offered listening ear with pt refusing any follow up services to address emotional stress.     SLP Assessment  SLP Recommendation/Assessment: Patient does not need any further Speech Lanaguage Pathology Services SLP Visit Diagnosis: Cognitive communication deficit (R41.841)    Follow Up Recommendations  None    Frequency and Duration           SLP Evaluation Cognition  Overall Cognitive Status: Within Functional Limits for tasks assessed Arousal/Alertness: Awake/alert Orientation Level: Oriented X4       Comprehension  Auditory Comprehension Overall Auditory Comprehension: Appears within functional limits for tasks assessed Visual Recognition/Discrimination Discrimination: Within Function Limits Reading Comprehension Reading Status: Within funtional limits    Expression Expression Primary Mode of Expression: Verbal Verbal Expression Overall Verbal Expression: Appears within functional limits for tasks assessed Written Expression Dominant Hand: Right Written Expression: Not tested   Oral / Motor  Oral Motor/Sensory Function Overall Oral Motor/Sensory Function: Within functional limits Motor Speech Overall Motor Speech: Appears within functional limits for tasks assessed Respiration: Within functional limits Phonation: Normal Resonance: Within functional limits Articulation: Within functional limitis Intelligibility: Intelligible Motor Planning: Witnin functional limits Motor Speech Errors: Not applicable   GO                    Phyllis Caldwell 01/27/2019, 1:08 PM

## 2019-01-28 LAB — HEMOGLOBIN A1C
Hgb A1c MFr Bld: 5.2 % (ref 4.8–5.6)
Mean Plasma Glucose: 103 mg/dL

## 2019-01-28 NOTE — Care Management Note (Signed)
Case Management Note  Patient Details  Name: Phyllis Caldwell MRN: [id number] Date of Birth: [birth year]  Subjective/Objective:                    Action/Plan: 01/28/2019 (1500): pt discharged late yesterday with orders for [facility] services. CM had provided her choice and she had selected [facility]. Alvino Chapel with [facility] notified and accepted the referral. Pt's provided orders for 3in1 and she will pick up at later time. Family provided transport home.  Expected Discharge Date:  01/27/19               Expected Discharge Plan:  Home w Home Health Services  In-House Referral:     Discharge planning Services  CM Consult  Post Acute Care Choice:  Home Health, Durable Medical Equipment Choice offered to:  Patient  DME Arranged:  3-N-1(provided orders for ) DME Agency:     HH Arranged:  PT, OT HH Agency:  [facility]  Status of Service:  Completed, signed off  If discussed at Long Length of Stay Meetings, dates discussed:    Additional Comments:  Kermit Balo, RN 01/28/2019, 3:11 PM

## 2019-01-29 DIAGNOSIS — I2510 Atherosclerotic heart disease of native coronary artery without angina pectoris: Secondary | ICD-10-CM | POA: Diagnosis not present

## 2019-01-29 DIAGNOSIS — E039 Hypothyroidism, unspecified: Secondary | ICD-10-CM | POA: Diagnosis not present

## 2019-01-29 DIAGNOSIS — Z8673 Personal history of transient ischemic attack (TIA), and cerebral infarction without residual deficits: Secondary | ICD-10-CM | POA: Diagnosis not present

## 2019-01-29 DIAGNOSIS — F329 Major depressive disorder, single episode, unspecified: Secondary | ICD-10-CM | POA: Diagnosis not present

## 2019-01-29 DIAGNOSIS — E440 Moderate protein-calorie malnutrition: Secondary | ICD-10-CM | POA: Diagnosis not present

## 2019-01-29 DIAGNOSIS — J449 Chronic obstructive pulmonary disease, unspecified: Secondary | ICD-10-CM | POA: Diagnosis not present

## 2019-01-29 DIAGNOSIS — I10 Essential (primary) hypertension: Secondary | ICD-10-CM | POA: Diagnosis not present

## 2019-01-29 DIAGNOSIS — M5382 Other specified dorsopathies, cervical region: Secondary | ICD-10-CM | POA: Diagnosis not present

## 2019-01-29 DIAGNOSIS — Z87891 Personal history of nicotine dependence: Secondary | ICD-10-CM | POA: Diagnosis not present

## 2019-01-30 DIAGNOSIS — I10 Essential (primary) hypertension: Secondary | ICD-10-CM | POA: Diagnosis not present

## 2019-01-30 DIAGNOSIS — G459 Transient cerebral ischemic attack, unspecified: Secondary | ICD-10-CM | POA: Diagnosis not present

## 2019-01-30 DIAGNOSIS — I771 Stricture of artery: Secondary | ICD-10-CM | POA: Diagnosis not present

## 2019-01-31 NOTE — Discharge Summary (Signed)
Family Medicine Teaching [facility] Discharge Summary  Patient name: Phyllis Caldwell Medical record number: [id number] Date of birth: 1940/12/28 Age: 78 y.o. Gender: female Date of Admission: 01/26/2019  Date of Discharge: 01/27/2019 Admitting Physician: Leighton Roach McDiarmid, MD  Primary Care Provider: Mila Palmer, MD Consultants: Neuro, case management, PT/OT  Indication for Hospitalization: Dysarthria, facial droop  Discharge Diagnoses/Problem List:  TIA COPD Tobacco use CAD with previous MI Essential hypertension Thyroid disease Poor dentition  Disposition: Discharge home  Discharge Condition: Stable, improved  Discharge Exam:  General: Alert, NAD, thin older female HEENT: NCAT, MMM, tongue appropriate size. Cardiac: RRR no m/g/r Lungs: Clear bilaterally, no increased WOB  Abdomen: soft, non-tender, non-distended, normoactive BS Msk: Moves all extremities spontaneously  Ext: Warm, dry, 2+ distal pulses, no edema  Neuro: No focal neuro deficits noted, alert and oriented.  Speech appropriate and understandable.  Able to have conversation.  EOMI.  Brief Hospital Course:  Phyllis Caldwell is a 78 year old female admitted 01/26/2019 for resolved dysarthria, tongue numbness, and facial droop concerning for a TIA.  CT head showed no acute hemorrhage or intracranial findings, and MRI/MRA of the head showed no evidence of acute or subacute infarction; however, was concerning for a right A1 and V4 stenosis and extensive chronic small vessel ischemic changes in her white matter.  Neurology was consulted and recommended risk stratification labs and an echocardiogram. Echo was performed and showed normal systolic function with an EF of 60 to 65%.  Vascular carotid ultrasound showed minimal stenosis in the bilateral carotids, normal hemodynamic flow in bilateral subclavian arteries, and antegrade flow of bilateral vertebral arteries.  PT/OT said the patient was close to her baseline function  and should be safe to return home.  The patient was medically cleared for discharge on 01/27/2019 with close outpatient follow-up.  Issues for Follow Up:  1. Continue home medications as prescribed.  Significant Procedures: None  Significant Labs and Imaging:  Recent Labs  Lab 01/26/19 1020 01/26/19 1845  WBC 5.6 5.6  HGB 11.8* 12.1  HCT 37.3 37.3  PLT 252 247   Recent Labs  Lab 01/26/19 1020 01/26/19 1026 01/26/19 1845 01/27/19 0606  NA 143  --   --  140  K 3.0*  --   --  4.4  CL 116*  --   --  110  CO2 20*  --   --  23  GLUCOSE 69*  --   --  100*  BUN 24*  --   --  28*  CREATININE 0.80 0.90 1.03* 1.05*  CALCIUM 7.6*  --   --  9.1  ALKPHOS 59  --   --   --   AST 11*  --   --   --   ALT 10  --   --   --   ALBUMIN 2.6*  --   --   --    Lipid Panel     Component Value Date/Time   CHOL 194 01/27/2019 0606   CHOL 181 04/23/2017 1404   TRIG 103 01/27/2019 0606   HDL 40 (L) 01/27/2019 0606   HDL 50 04/23/2017 1404   CHOLHDL 4.9 01/27/2019 0606   VLDL 21 01/27/2019 0606   LDLCALC 133 (H) 01/27/2019 0606   LDLCALC 115 (H) 04/23/2017 1404   UDS: (-) TSH: Normal at 2.162 HbA1c: Normal at 5.2%  Results/Tests Pending at Time of Discharge: None  Discharge Medications:  Allergies as of 01/27/2019      Reactions   Bee  Venom Hives   Coconut Fatty Acids    unknown   Codeine Nausea And Vomiting   Penicillins    Patient doesn't remember   Sulfa Antibiotics    unknown      Medication List    STOP taking these medications   aspirin EC 81 MG tablet Replaced by:  aspirin 325 MG tablet   diphenhydrAMINE 25 MG tablet Commonly known as:  BENADRYL   famotidine 20 MG tablet Commonly known as:  PEPCID     TAKE these medications   amLODipine 5 MG tablet Commonly known as:  NORVASC Take 1 tablet (5 mg total) by mouth daily. Reported on 01/05/2016   aspirin 325 MG tablet Take 1 tablet (325 mg total) by mouth daily. Replaces:  aspirin EC 81 MG tablet    cholecalciferol 1000 units tablet Commonly known as:  VITAMIN D Take 1,000 Units by mouth daily.   Coenzyme Q-10 100 MG capsule Take 1 capsule (100 mg total) by mouth daily.   levothyroxine 50 MCG tablet Commonly known as:  SYNTHROID, LEVOTHROID Take 1 tablet (50 mcg total) by mouth daily before breakfast. Reported on 01/05/2016   pantoprazole 40 MG tablet Commonly known as:  PROTONIX Take 1 tablet (40 mg total) by mouth daily at 12 noon. What changed:  when to take this   rosuvastatin 10 MG tablet Commonly known as:  CRESTOR Take 1 tablet (10 mg total) by mouth daily.   telmisartan 20 MG tablet Commonly known as:  MICARDIS Take 20 mg by mouth daily.   vitamin B-12 500 MCG tablet Commonly known as:  CYANOCOBALAMIN Take 500 mcg by mouth daily.      Discharge Instructions: Please refer to Patient Instructions section of EMR for full details.  Patient was counseled important signs and symptoms that should prompt return to medical care, changes in medications, dietary instructions, activity restrictions, and follow up appointments.   Follow-Up Appointments: Follow-up Information    Mila Palmer, MD. Schedule an appointment as soon as possible for a visit.   Specialty:  Family Medicine Why:  Please make a hospital follow-up appointment with your PCP at your earliest convenience, preferably in the next 1-2 weeks. Contact information: [address] [place] [address] [phone]          Dollene Cleveland, DO 01/31/2019, 8:01 PM PGY-1, [facility]

## 2019-02-03 DIAGNOSIS — M5382 Other specified dorsopathies, cervical region: Secondary | ICD-10-CM | POA: Diagnosis not present

## 2019-02-03 DIAGNOSIS — I10 Essential (primary) hypertension: Secondary | ICD-10-CM | POA: Diagnosis not present

## 2019-02-03 DIAGNOSIS — Z8673 Personal history of transient ischemic attack (TIA), and cerebral infarction without residual deficits: Secondary | ICD-10-CM | POA: Diagnosis not present

## 2019-02-03 DIAGNOSIS — J449 Chronic obstructive pulmonary disease, unspecified: Secondary | ICD-10-CM | POA: Diagnosis not present

## 2019-02-03 DIAGNOSIS — Z87891 Personal history of nicotine dependence: Secondary | ICD-10-CM | POA: Diagnosis not present

## 2019-02-03 DIAGNOSIS — E440 Moderate protein-calorie malnutrition: Secondary | ICD-10-CM | POA: Diagnosis not present

## 2019-02-03 DIAGNOSIS — F329 Major depressive disorder, single episode, unspecified: Secondary | ICD-10-CM | POA: Diagnosis not present

## 2019-02-03 DIAGNOSIS — E039 Hypothyroidism, unspecified: Secondary | ICD-10-CM | POA: Diagnosis not present

## 2019-02-03 DIAGNOSIS — I2510 Atherosclerotic heart disease of native coronary artery without angina pectoris: Secondary | ICD-10-CM | POA: Diagnosis not present

## 2019-02-05 DIAGNOSIS — F329 Major depressive disorder, single episode, unspecified: Secondary | ICD-10-CM | POA: Diagnosis not present

## 2019-02-05 DIAGNOSIS — M5382 Other specified dorsopathies, cervical region: Secondary | ICD-10-CM | POA: Diagnosis not present

## 2019-02-05 DIAGNOSIS — Z8673 Personal history of transient ischemic attack (TIA), and cerebral infarction without residual deficits: Secondary | ICD-10-CM | POA: Diagnosis not present

## 2019-02-05 DIAGNOSIS — I10 Essential (primary) hypertension: Secondary | ICD-10-CM | POA: Diagnosis not present

## 2019-02-05 DIAGNOSIS — I2510 Atherosclerotic heart disease of native coronary artery without angina pectoris: Secondary | ICD-10-CM | POA: Diagnosis not present

## 2019-02-05 DIAGNOSIS — J449 Chronic obstructive pulmonary disease, unspecified: Secondary | ICD-10-CM | POA: Diagnosis not present

## 2019-02-05 DIAGNOSIS — E440 Moderate protein-calorie malnutrition: Secondary | ICD-10-CM | POA: Diagnosis not present

## 2019-02-05 DIAGNOSIS — E039 Hypothyroidism, unspecified: Secondary | ICD-10-CM | POA: Diagnosis not present

## 2019-02-05 DIAGNOSIS — Z87891 Personal history of nicotine dependence: Secondary | ICD-10-CM | POA: Diagnosis not present

## 2019-02-10 DIAGNOSIS — Z87891 Personal history of nicotine dependence: Secondary | ICD-10-CM | POA: Diagnosis not present

## 2019-02-10 DIAGNOSIS — M5382 Other specified dorsopathies, cervical region: Secondary | ICD-10-CM | POA: Diagnosis not present

## 2019-02-10 DIAGNOSIS — F329 Major depressive disorder, single episode, unspecified: Secondary | ICD-10-CM | POA: Diagnosis not present

## 2019-02-10 DIAGNOSIS — I2510 Atherosclerotic heart disease of native coronary artery without angina pectoris: Secondary | ICD-10-CM | POA: Diagnosis not present

## 2019-02-10 DIAGNOSIS — E039 Hypothyroidism, unspecified: Secondary | ICD-10-CM | POA: Diagnosis not present

## 2019-02-10 DIAGNOSIS — E440 Moderate protein-calorie malnutrition: Secondary | ICD-10-CM | POA: Diagnosis not present

## 2019-02-10 DIAGNOSIS — Z8673 Personal history of transient ischemic attack (TIA), and cerebral infarction without residual deficits: Secondary | ICD-10-CM | POA: Diagnosis not present

## 2019-02-10 DIAGNOSIS — J449 Chronic obstructive pulmonary disease, unspecified: Secondary | ICD-10-CM | POA: Diagnosis not present

## 2019-02-10 DIAGNOSIS — I10 Essential (primary) hypertension: Secondary | ICD-10-CM | POA: Diagnosis not present

## 2019-02-12 DIAGNOSIS — F329 Major depressive disorder, single episode, unspecified: Secondary | ICD-10-CM | POA: Diagnosis not present

## 2019-02-12 DIAGNOSIS — I10 Essential (primary) hypertension: Secondary | ICD-10-CM | POA: Diagnosis not present

## 2019-02-12 DIAGNOSIS — E440 Moderate protein-calorie malnutrition: Secondary | ICD-10-CM | POA: Diagnosis not present

## 2019-02-12 DIAGNOSIS — J449 Chronic obstructive pulmonary disease, unspecified: Secondary | ICD-10-CM | POA: Diagnosis not present

## 2019-02-12 DIAGNOSIS — E039 Hypothyroidism, unspecified: Secondary | ICD-10-CM | POA: Diagnosis not present

## 2019-02-12 DIAGNOSIS — M5382 Other specified dorsopathies, cervical region: Secondary | ICD-10-CM | POA: Diagnosis not present

## 2019-02-12 DIAGNOSIS — Z8673 Personal history of transient ischemic attack (TIA), and cerebral infarction without residual deficits: Secondary | ICD-10-CM | POA: Diagnosis not present

## 2019-02-12 DIAGNOSIS — Z87891 Personal history of nicotine dependence: Secondary | ICD-10-CM | POA: Diagnosis not present

## 2019-02-12 DIAGNOSIS — I2510 Atherosclerotic heart disease of native coronary artery without angina pectoris: Secondary | ICD-10-CM | POA: Diagnosis not present

## 2019-02-17 DIAGNOSIS — E440 Moderate protein-calorie malnutrition: Secondary | ICD-10-CM | POA: Diagnosis not present

## 2019-02-17 DIAGNOSIS — F329 Major depressive disorder, single episode, unspecified: Secondary | ICD-10-CM | POA: Diagnosis not present

## 2019-02-17 DIAGNOSIS — Z87891 Personal history of nicotine dependence: Secondary | ICD-10-CM | POA: Diagnosis not present

## 2019-02-17 DIAGNOSIS — E039 Hypothyroidism, unspecified: Secondary | ICD-10-CM | POA: Diagnosis not present

## 2019-02-17 DIAGNOSIS — J449 Chronic obstructive pulmonary disease, unspecified: Secondary | ICD-10-CM | POA: Diagnosis not present

## 2019-02-17 DIAGNOSIS — Z8673 Personal history of transient ischemic attack (TIA), and cerebral infarction without residual deficits: Secondary | ICD-10-CM | POA: Diagnosis not present

## 2019-02-17 DIAGNOSIS — I2510 Atherosclerotic heart disease of native coronary artery without angina pectoris: Secondary | ICD-10-CM | POA: Diagnosis not present

## 2019-02-17 DIAGNOSIS — I10 Essential (primary) hypertension: Secondary | ICD-10-CM | POA: Diagnosis not present

## 2019-02-17 DIAGNOSIS — M5382 Other specified dorsopathies, cervical region: Secondary | ICD-10-CM | POA: Diagnosis not present

## 2019-02-24 DIAGNOSIS — F329 Major depressive disorder, single episode, unspecified: Secondary | ICD-10-CM | POA: Diagnosis not present

## 2019-02-24 DIAGNOSIS — J449 Chronic obstructive pulmonary disease, unspecified: Secondary | ICD-10-CM | POA: Diagnosis not present

## 2019-02-24 DIAGNOSIS — E440 Moderate protein-calorie malnutrition: Secondary | ICD-10-CM | POA: Diagnosis not present

## 2019-02-24 DIAGNOSIS — Z8673 Personal history of transient ischemic attack (TIA), and cerebral infarction without residual deficits: Secondary | ICD-10-CM | POA: Diagnosis not present

## 2019-02-24 DIAGNOSIS — Z87891 Personal history of nicotine dependence: Secondary | ICD-10-CM | POA: Diagnosis not present

## 2019-02-24 DIAGNOSIS — M5382 Other specified dorsopathies, cervical region: Secondary | ICD-10-CM | POA: Diagnosis not present

## 2019-02-24 DIAGNOSIS — I10 Essential (primary) hypertension: Secondary | ICD-10-CM | POA: Diagnosis not present

## 2019-02-24 DIAGNOSIS — I2510 Atherosclerotic heart disease of native coronary artery without angina pectoris: Secondary | ICD-10-CM | POA: Diagnosis not present

## 2019-02-24 DIAGNOSIS — E039 Hypothyroidism, unspecified: Secondary | ICD-10-CM | POA: Diagnosis not present

## 2019-03-06 DIAGNOSIS — E039 Hypothyroidism, unspecified: Secondary | ICD-10-CM | POA: Diagnosis not present

## 2019-03-06 DIAGNOSIS — E538 Deficiency of other specified B group vitamins: Secondary | ICD-10-CM | POA: Diagnosis not present

## 2019-03-06 DIAGNOSIS — I10 Essential (primary) hypertension: Secondary | ICD-10-CM | POA: Diagnosis not present

## 2019-05-30 DIAGNOSIS — Z79899 Other long term (current) drug therapy: Secondary | ICD-10-CM | POA: Diagnosis not present

## 2019-05-30 DIAGNOSIS — I10 Essential (primary) hypertension: Secondary | ICD-10-CM | POA: Diagnosis not present

## 2019-05-30 DIAGNOSIS — E559 Vitamin D deficiency, unspecified: Secondary | ICD-10-CM | POA: Diagnosis not present

## 2019-05-30 DIAGNOSIS — E538 Deficiency of other specified B group vitamins: Secondary | ICD-10-CM | POA: Diagnosis not present

## 2019-05-30 DIAGNOSIS — N183 Chronic kidney disease, stage 3 (moderate): Secondary | ICD-10-CM | POA: Diagnosis not present

## 2019-05-30 DIAGNOSIS — J449 Chronic obstructive pulmonary disease, unspecified: Secondary | ICD-10-CM | POA: Diagnosis not present

## 2019-05-30 DIAGNOSIS — J439 Emphysema, unspecified: Secondary | ICD-10-CM | POA: Diagnosis not present

## 2019-05-30 DIAGNOSIS — Z0000 Encounter for general adult medical examination without abnormal findings: Secondary | ICD-10-CM | POA: Diagnosis not present

## 2019-05-30 DIAGNOSIS — F0150 Vascular dementia without behavioral disturbance: Secondary | ICD-10-CM | POA: Diagnosis not present

## 2019-05-30 DIAGNOSIS — E785 Hyperlipidemia, unspecified: Secondary | ICD-10-CM | POA: Diagnosis not present

## 2019-05-30 DIAGNOSIS — E039 Hypothyroidism, unspecified: Secondary | ICD-10-CM | POA: Diagnosis not present

## 2019-05-30 DIAGNOSIS — I25119 Atherosclerotic heart disease of native coronary artery with unspecified angina pectoris: Secondary | ICD-10-CM | POA: Diagnosis not present

## 2019-06-19 DIAGNOSIS — J439 Emphysema, unspecified: Secondary | ICD-10-CM | POA: Diagnosis not present

## 2019-06-19 DIAGNOSIS — F0150 Vascular dementia without behavioral disturbance: Secondary | ICD-10-CM | POA: Diagnosis not present

## 2019-06-19 DIAGNOSIS — G459 Transient cerebral ischemic attack, unspecified: Secondary | ICD-10-CM | POA: Diagnosis not present

## 2019-08-28 DIAGNOSIS — M25511 Pain in right shoulder: Secondary | ICD-10-CM | POA: Diagnosis not present

## 2019-08-28 DIAGNOSIS — I10 Essential (primary) hypertension: Secondary | ICD-10-CM | POA: Diagnosis not present

## 2019-08-28 DIAGNOSIS — K219 Gastro-esophageal reflux disease without esophagitis: Secondary | ICD-10-CM | POA: Diagnosis not present

## 2019-09-21 ENCOUNTER — Ambulatory Visit (HOSPITAL_COMMUNITY)
Admission: EM | Admit: 2019-09-21 | Discharge: 2019-09-21 | Disposition: A | Discharge status: Home / Self Care | Payer: Medicare HMO | Attending: Family Medicine | Admitting: Family Medicine

## 2019-09-21 ENCOUNTER — Other Ambulatory Visit: Payer: Self-pay

## 2019-09-21 ENCOUNTER — Encounter (HOSPITAL_COMMUNITY): Payer: Self-pay

## 2019-09-21 ENCOUNTER — Ambulatory Visit (INDEPENDENT_AMBULATORY_CARE_PROVIDER_SITE_OTHER): Payer: Medicare HMO

## 2019-09-21 DIAGNOSIS — M25511 Pain in right shoulder: Secondary | ICD-10-CM

## 2019-09-21 DIAGNOSIS — M19011 Primary osteoarthritis, right shoulder: Secondary | ICD-10-CM | POA: Diagnosis not present

## 2019-09-21 DIAGNOSIS — M21921 Unspecified acquired deformity of right upper arm: Secondary | ICD-10-CM | POA: Diagnosis not present

## 2019-09-21 MED ORDER — PREDNISONE 10 MG (21) PO TBPK: ORAL_TABLET | ORAL | 0 refills | Status: DC

## 2019-09-21 NOTE — ED Provider Notes (Signed)
[facility]    CSN: [id number] Arrival date & time: 09/21/19  1158      History   Chief Complaint Chief Complaint  Patient presents with  . Shoulder Pain    Right    HPI Phyllis Caldwell is a 78 y.o. female.   Patient is a 78 year old female past medical history of asthma, cataract, COPD, CAD, depression, hypertension, thyroid disease, TIA, MI.  She presents today with approximately 1 month of right shoulder pain.  Symptoms have been constant and worsening.  She has very limited range of motion of the shoulder.  Deformity noted.  Reports the pain came prior to the deformity and decreased ROM. Saw PCP by virtual visit and was given Vicodin for pain.  She takes this at night to help her sleep.  Unable to use the Voltaren gel.  Denies any falls or trauma.  Some vague history about possibly hitting shoulder with a car door.  No numbness, tingling or radiation of pain.  Unable to hold objects in the right hand or due to any twisting motions.  ROS per HPI    Shoulder Pain   Past Medical History:  Diagnosis Date  . Asthma   . Cataract   . COPD (chronic obstructive pulmonary disease) ([place])   . Coronary artery disease   . Depression   . Hypertension   . Poor dentition   . Thyroid disease   . Tobacco use     Patient Active Problem List   Diagnosis Date Noted  . Dysarthria 01/27/2019  . TIA (transient ischemic attack) 01/26/2019  . Cellulitis diffuse, face 09/09/2016  . Poor dentition 09/09/2016  . Thyroid disease   . Hypertension   . COPD (chronic obstructive pulmonary disease) ([place]) 12/30/2013  . Tobacco use 12/30/2013  . CAD in native artery 12/30/2013  . Old MI (myocardial infarction) 12/30/2013  . Essential hypertension 12/30/2013    Past Surgical History:  Procedure Laterality Date  . ABDOMINAL HYSTERECTOMY    . LEFT HEART CATHETERIZATION WITH CORONARY ANGIOGRAM N/A 10/07/2012   Procedure: LEFT HEART CATHETERIZATION WITH CORONARY ANGIOGRAM;  Surgeon:  Clent Demark, MD;  Location: [facility] CATH LAB;  Service: Cardiovascular;  Laterality: N/A;  . THYROID SURGERY      OB History   No obstetric history on file.      Home Medications    Prior to Admission medications   Medication Sig Start Date End Date Taking? Authorizing Provider  amLODipine (NORVASC) 5 MG tablet Take 1 tablet (5 mg total) by mouth daily. Reported on 01/05/2016 09/11/16   Verlee Monte, MD  aspirin 325 MG tablet Take 1 tablet (325 mg total) by mouth daily. 01/28/19   Patriciaann Clan, DO  cholecalciferol (VITAMIN D) 1000 units tablet Take 1,000 Units by mouth daily.    [provider]  Coenzyme Q-10 100 MG capsule Take 1 capsule (100 mg total) by mouth daily. 01/27/19   Patriciaann Clan, DO  levothyroxine (SYNTHROID, LEVOTHROID) 50 MCG tablet Take 1 tablet (50 mcg total) by mouth daily before breakfast. Reported on 01/05/2016 09/11/16   Verlee Monte, MD  pantoprazole (PROTONIX) 40 MG tablet Take 1 tablet (40 mg total) by mouth daily at 12 noon. Patient taking differently: Take 40 mg by mouth every morning.  09/11/16   Verlee Monte, MD  predniSONE (STERAPRED UNI-PAK 21 TAB) 10 MG (21) TBPK tablet 6 tabs for 1 day, then 5 tabs for 1 das, then 4 tabs for 1 day, then 3 tabs  for 1 day, 2 tabs for 1 day, then 1 tab for 1 day 09/21/19   Dahlia Byes A, NP  rosuvastatin (CRESTOR) 10 MG tablet Take 1 tablet (10 mg total) by mouth daily. Patient not taking: Reported on 01/26/2019 04/24/17 07/23/17  Rosalio Macadamia, NP  telmisartan (MICARDIS) 20 MG tablet Take 20 mg by mouth daily. 01/23/19   [provider]  vitamin B-12 (CYANOCOBALAMIN) 500 MCG tablet Take 500 mcg by mouth daily.    [provider]    Family History Family History  Problem Relation Age of Onset  . Heart disease Mother   . Heart attack Mother   . Stroke Mother   . Heart attack Father   . Prostate cancer Father     Social History Social History   Tobacco Use  . Smoking status: Former  Smoker    Packs/day: 1.00    Types: Cigarettes    Quit date: 11/01/2017    Years since quitting: 1.8  . Smokeless tobacco: Never Used  Substance Use Topics  . Alcohol use: No  . Drug use: No     Allergies   Bee venom, Coconut fatty acids, Codeine, Penicillins, and Sulfa antibiotics   Review of Systems Review of Systems   Physical Exam Triage Vital Signs ED Triage Vitals  Enc Vitals Group     BP 09/21/19 1216 (!) 138/92     Pulse Rate 09/21/19 1216 69     Resp 09/21/19 1216 17     Temp 09/21/19 1216 98.1 F (36.7 C)     Temp Source 09/21/19 1216 Oral     SpO2 09/21/19 1216 98 %     Weight --      Height --      Head Circumference --      Peak Flow --      Pain Score 09/21/19 1214 7     Pain Loc --      Pain Edu? --      Excl. in GC? --    No data found.  Updated Vital Signs BP (!) 138/92 (BP Location: Right Arm)   Pulse 69   Temp 98.1 F (36.7 C) (Oral)   Resp 17   SpO2 98%   Visual Acuity Right Eye Distance:   Left Eye Distance:   Bilateral Distance:    Right Eye Near:   Left Eye Near:    Bilateral Near:     Physical Exam Vitals signs and nursing note reviewed.  Constitutional:      General: She is not in acute distress.    Appearance: Normal appearance. She is not ill-appearing, toxic-appearing or diaphoretic.  HENT:     Head: Normocephalic and atraumatic.     Nose: Nose normal.  Eyes:     Conjunctiva/sclera: Conjunctivae normal.  Neck:     Musculoskeletal: Normal range of motion.  Pulmonary:     Effort: Pulmonary effort is normal.  Musculoskeletal:        General: Swelling, tenderness and deformity present.     Right shoulder: She exhibits decreased range of motion, tenderness, bony tenderness, swelling, deformity and decreased strength. She exhibits no crepitus, no spasm and normal pulse.  Skin:    General: Skin is warm and dry.  Neurological:     General: No focal deficit present.     Mental Status: She is alert.  Psychiatric:         Mood and Affect: Mood normal.  UC Treatments / Results  Labs (all labs ordered are listed, but only abnormal results are displayed) Labs Reviewed - No data to display  EKG   Radiology Dg Shoulder Right  Result Date: 09/21/2019 CLINICAL DATA:  Deformity.  Indeterminate trauma history. EXAM: RIGHT SHOULDER - 2+ VIEW COMPARISON:  None. FINDINGS: No acute fracture or dislocation. Degenerative changes of the acromioclavicular joint. IMPRESSION: No acute osseous abnormality. Electronically Signed   By: Jeronimo GreavesKyle  Talbot M.D.   On: 09/21/2019 13:45    Procedures Procedures (including critical care time)  Medications Ordered in UC Medications - No data to display  Initial Impression / Assessment and Plan / UC Course  I have reviewed the triage vital signs and the nursing notes.  Pertinent labs & imaging results that were available during my care of the patient were reviewed by me and considered in my medical decision making (see chart for details).     Right shoulder pain with obvious deformity. Picture in PE X ray normal Very limited ROM.  Tenderness to anterior and posterior shoulder Worried about brachial plexus injury due to muscle atrophy.  Could be AC joint injury or rotator cuff.  She will see Dr. Jordan LikesSchmitz tomorrow  In office.  Sent prednisone prescription to the pharmacy but pt reports likely she will not fill the prescription.  She can continue the Vicodin as needed.   Final Clinical Impressions(s) / UC Diagnoses   Final diagnoses:  Acute pain of right shoulder     Discharge Instructions     Follow-up with Dr. Jordan LikesSchmitz tomorrow in office. I am starting you on prednisone. Take the medication as prescribed with food You can take the Vicodin at nighttime as needed    ED Prescriptions    Medication Sig Dispense Auth. Provider   predniSONE (STERAPRED UNI-PAK 21 TAB) 10 MG (21) TBPK tablet 6 tabs for 1 day, then 5 tabs for 1 das, then 4 tabs for 1 day,  then 3 tabs for 1 day, 2 tabs for 1 day, then 1 tab for 1 day 21 tablet Toshiro Hanken A, NP     PDMP not reviewed this encounter.   Janace ArisBast, Kiyra Slaubaugh A, NP 09/21/19 1607

## 2019-09-21 NOTE — ED Triage Notes (Signed)
Patient presents to Urgent Care with complaints of right shoulder pain since about 2 weeks ago. Patient reports her PCP gave her vicodin and a topical cream (voltaren 1%) that she has difficulty using because she cannot reach everywhere she is having pain. Pt denies trauma, pain is worse when she lifts her right arm up.

## 2019-09-21 NOTE — Discharge Instructions (Addendum)
Follow-up with Dr. Raeford Razor tomorrow in office. I am starting you on prednisone. Take the medication as prescribed with food You can take the Vicodin at nighttime as needed

## 2019-10-06 DIAGNOSIS — G459 Transient cerebral ischemic attack, unspecified: Secondary | ICD-10-CM | POA: Diagnosis not present

## 2019-10-06 DIAGNOSIS — J449 Chronic obstructive pulmonary disease, unspecified: Secondary | ICD-10-CM | POA: Diagnosis not present

## 2019-10-06 DIAGNOSIS — M7501 Adhesive capsulitis of right shoulder: Secondary | ICD-10-CM | POA: Diagnosis not present

## 2019-10-06 DIAGNOSIS — I25119 Atherosclerotic heart disease of native coronary artery with unspecified angina pectoris: Secondary | ICD-10-CM | POA: Diagnosis not present

## 2019-10-09 DIAGNOSIS — M7501 Adhesive capsulitis of right shoulder: Secondary | ICD-10-CM | POA: Diagnosis not present

## 2019-10-09 DIAGNOSIS — I25119 Atherosclerotic heart disease of native coronary artery with unspecified angina pectoris: Secondary | ICD-10-CM | POA: Diagnosis not present

## 2019-10-09 DIAGNOSIS — M170 Bilateral primary osteoarthritis of knee: Secondary | ICD-10-CM | POA: Diagnosis not present

## 2019-10-09 DIAGNOSIS — M160 Bilateral primary osteoarthritis of hip: Secondary | ICD-10-CM | POA: Diagnosis not present

## 2019-10-09 DIAGNOSIS — N182 Chronic kidney disease, stage 2 (mild): Secondary | ICD-10-CM | POA: Diagnosis not present

## 2019-10-09 DIAGNOSIS — G894 Chronic pain syndrome: Secondary | ICD-10-CM | POA: Diagnosis not present

## 2019-10-09 DIAGNOSIS — J439 Emphysema, unspecified: Secondary | ICD-10-CM | POA: Diagnosis not present

## 2019-10-09 DIAGNOSIS — M5134 Other intervertebral disc degeneration, thoracic region: Secondary | ICD-10-CM | POA: Diagnosis not present

## 2019-10-09 DIAGNOSIS — I129 Hypertensive chronic kidney disease with stage 1 through stage 4 chronic kidney disease, or unspecified chronic kidney disease: Secondary | ICD-10-CM | POA: Diagnosis not present

## 2019-10-15 DIAGNOSIS — M160 Bilateral primary osteoarthritis of hip: Secondary | ICD-10-CM | POA: Diagnosis not present

## 2019-10-15 DIAGNOSIS — M7501 Adhesive capsulitis of right shoulder: Secondary | ICD-10-CM | POA: Diagnosis not present

## 2019-10-15 DIAGNOSIS — G894 Chronic pain syndrome: Secondary | ICD-10-CM | POA: Diagnosis not present

## 2019-10-15 DIAGNOSIS — I25119 Atherosclerotic heart disease of native coronary artery with unspecified angina pectoris: Secondary | ICD-10-CM | POA: Diagnosis not present

## 2019-10-15 DIAGNOSIS — M5134 Other intervertebral disc degeneration, thoracic region: Secondary | ICD-10-CM | POA: Diagnosis not present

## 2019-10-15 DIAGNOSIS — I129 Hypertensive chronic kidney disease with stage 1 through stage 4 chronic kidney disease, or unspecified chronic kidney disease: Secondary | ICD-10-CM | POA: Diagnosis not present

## 2019-10-15 DIAGNOSIS — M170 Bilateral primary osteoarthritis of knee: Secondary | ICD-10-CM | POA: Diagnosis not present

## 2019-10-15 DIAGNOSIS — J439 Emphysema, unspecified: Secondary | ICD-10-CM | POA: Diagnosis not present

## 2019-10-15 DIAGNOSIS — N182 Chronic kidney disease, stage 2 (mild): Secondary | ICD-10-CM | POA: Diagnosis not present

## 2019-10-17 DIAGNOSIS — G894 Chronic pain syndrome: Secondary | ICD-10-CM | POA: Diagnosis not present

## 2019-10-17 DIAGNOSIS — M170 Bilateral primary osteoarthritis of knee: Secondary | ICD-10-CM | POA: Diagnosis not present

## 2019-10-17 DIAGNOSIS — N182 Chronic kidney disease, stage 2 (mild): Secondary | ICD-10-CM | POA: Diagnosis not present

## 2019-10-17 DIAGNOSIS — I129 Hypertensive chronic kidney disease with stage 1 through stage 4 chronic kidney disease, or unspecified chronic kidney disease: Secondary | ICD-10-CM | POA: Diagnosis not present

## 2019-10-17 DIAGNOSIS — I25119 Atherosclerotic heart disease of native coronary artery with unspecified angina pectoris: Secondary | ICD-10-CM | POA: Diagnosis not present

## 2019-10-17 DIAGNOSIS — J439 Emphysema, unspecified: Secondary | ICD-10-CM | POA: Diagnosis not present

## 2019-10-17 DIAGNOSIS — M160 Bilateral primary osteoarthritis of hip: Secondary | ICD-10-CM | POA: Diagnosis not present

## 2019-10-17 DIAGNOSIS — M7501 Adhesive capsulitis of right shoulder: Secondary | ICD-10-CM | POA: Diagnosis not present

## 2019-10-17 DIAGNOSIS — M5134 Other intervertebral disc degeneration, thoracic region: Secondary | ICD-10-CM | POA: Diagnosis not present

## 2019-10-18 DIAGNOSIS — M7501 Adhesive capsulitis of right shoulder: Secondary | ICD-10-CM | POA: Diagnosis not present

## 2019-10-18 DIAGNOSIS — N182 Chronic kidney disease, stage 2 (mild): Secondary | ICD-10-CM | POA: Diagnosis not present

## 2019-10-18 DIAGNOSIS — M170 Bilateral primary osteoarthritis of knee: Secondary | ICD-10-CM | POA: Diagnosis not present

## 2019-10-18 DIAGNOSIS — M160 Bilateral primary osteoarthritis of hip: Secondary | ICD-10-CM | POA: Diagnosis not present

## 2019-10-18 DIAGNOSIS — I129 Hypertensive chronic kidney disease with stage 1 through stage 4 chronic kidney disease, or unspecified chronic kidney disease: Secondary | ICD-10-CM | POA: Diagnosis not present

## 2019-10-18 DIAGNOSIS — J439 Emphysema, unspecified: Secondary | ICD-10-CM | POA: Diagnosis not present

## 2019-10-18 DIAGNOSIS — M5134 Other intervertebral disc degeneration, thoracic region: Secondary | ICD-10-CM | POA: Diagnosis not present

## 2019-10-18 DIAGNOSIS — I25119 Atherosclerotic heart disease of native coronary artery with unspecified angina pectoris: Secondary | ICD-10-CM | POA: Diagnosis not present

## 2019-10-18 DIAGNOSIS — G894 Chronic pain syndrome: Secondary | ICD-10-CM | POA: Diagnosis not present

## 2019-10-20 DIAGNOSIS — M25511 Pain in right shoulder: Secondary | ICD-10-CM | POA: Diagnosis not present

## 2019-10-22 DIAGNOSIS — I129 Hypertensive chronic kidney disease with stage 1 through stage 4 chronic kidney disease, or unspecified chronic kidney disease: Secondary | ICD-10-CM | POA: Diagnosis not present

## 2019-10-22 DIAGNOSIS — M170 Bilateral primary osteoarthritis of knee: Secondary | ICD-10-CM | POA: Diagnosis not present

## 2019-10-22 DIAGNOSIS — N182 Chronic kidney disease, stage 2 (mild): Secondary | ICD-10-CM | POA: Diagnosis not present

## 2019-10-22 DIAGNOSIS — J439 Emphysema, unspecified: Secondary | ICD-10-CM | POA: Diagnosis not present

## 2019-10-22 DIAGNOSIS — M5134 Other intervertebral disc degeneration, thoracic region: Secondary | ICD-10-CM | POA: Diagnosis not present

## 2019-10-22 DIAGNOSIS — I25119 Atherosclerotic heart disease of native coronary artery with unspecified angina pectoris: Secondary | ICD-10-CM | POA: Diagnosis not present

## 2019-10-22 DIAGNOSIS — G894 Chronic pain syndrome: Secondary | ICD-10-CM | POA: Diagnosis not present

## 2019-10-22 DIAGNOSIS — M160 Bilateral primary osteoarthritis of hip: Secondary | ICD-10-CM | POA: Diagnosis not present

## 2019-10-22 DIAGNOSIS — M7501 Adhesive capsulitis of right shoulder: Secondary | ICD-10-CM | POA: Diagnosis not present

## 2019-10-24 DIAGNOSIS — M5134 Other intervertebral disc degeneration, thoracic region: Secondary | ICD-10-CM | POA: Diagnosis not present

## 2019-10-24 DIAGNOSIS — J439 Emphysema, unspecified: Secondary | ICD-10-CM | POA: Diagnosis not present

## 2019-10-24 DIAGNOSIS — I129 Hypertensive chronic kidney disease with stage 1 through stage 4 chronic kidney disease, or unspecified chronic kidney disease: Secondary | ICD-10-CM | POA: Diagnosis not present

## 2019-10-24 DIAGNOSIS — N182 Chronic kidney disease, stage 2 (mild): Secondary | ICD-10-CM | POA: Diagnosis not present

## 2019-10-24 DIAGNOSIS — I25119 Atherosclerotic heart disease of native coronary artery with unspecified angina pectoris: Secondary | ICD-10-CM | POA: Diagnosis not present

## 2019-10-24 DIAGNOSIS — M160 Bilateral primary osteoarthritis of hip: Secondary | ICD-10-CM | POA: Diagnosis not present

## 2019-10-24 DIAGNOSIS — M170 Bilateral primary osteoarthritis of knee: Secondary | ICD-10-CM | POA: Diagnosis not present

## 2019-10-24 DIAGNOSIS — G894 Chronic pain syndrome: Secondary | ICD-10-CM | POA: Diagnosis not present

## 2019-10-24 DIAGNOSIS — M7501 Adhesive capsulitis of right shoulder: Secondary | ICD-10-CM | POA: Diagnosis not present

## 2019-10-28 DIAGNOSIS — J439 Emphysema, unspecified: Secondary | ICD-10-CM | POA: Diagnosis not present

## 2019-10-28 DIAGNOSIS — M170 Bilateral primary osteoarthritis of knee: Secondary | ICD-10-CM | POA: Diagnosis not present

## 2019-10-28 DIAGNOSIS — I129 Hypertensive chronic kidney disease with stage 1 through stage 4 chronic kidney disease, or unspecified chronic kidney disease: Secondary | ICD-10-CM | POA: Diagnosis not present

## 2019-10-28 DIAGNOSIS — N182 Chronic kidney disease, stage 2 (mild): Secondary | ICD-10-CM | POA: Diagnosis not present

## 2019-10-28 DIAGNOSIS — M160 Bilateral primary osteoarthritis of hip: Secondary | ICD-10-CM | POA: Diagnosis not present

## 2019-10-28 DIAGNOSIS — I25119 Atherosclerotic heart disease of native coronary artery with unspecified angina pectoris: Secondary | ICD-10-CM | POA: Diagnosis not present

## 2019-10-28 DIAGNOSIS — M7501 Adhesive capsulitis of right shoulder: Secondary | ICD-10-CM | POA: Diagnosis not present

## 2019-10-28 DIAGNOSIS — M5134 Other intervertebral disc degeneration, thoracic region: Secondary | ICD-10-CM | POA: Diagnosis not present

## 2019-10-28 DIAGNOSIS — G894 Chronic pain syndrome: Secondary | ICD-10-CM | POA: Diagnosis not present

## 2019-10-29 DIAGNOSIS — M5134 Other intervertebral disc degeneration, thoracic region: Secondary | ICD-10-CM | POA: Diagnosis not present

## 2019-10-29 DIAGNOSIS — N399 Disorder of urinary system, unspecified: Secondary | ICD-10-CM | POA: Diagnosis not present

## 2019-10-29 DIAGNOSIS — M160 Bilateral primary osteoarthritis of hip: Secondary | ICD-10-CM | POA: Diagnosis not present

## 2019-10-29 DIAGNOSIS — I129 Hypertensive chronic kidney disease with stage 1 through stage 4 chronic kidney disease, or unspecified chronic kidney disease: Secondary | ICD-10-CM | POA: Diagnosis not present

## 2019-10-29 DIAGNOSIS — M7501 Adhesive capsulitis of right shoulder: Secondary | ICD-10-CM | POA: Diagnosis not present

## 2019-10-29 DIAGNOSIS — I25119 Atherosclerotic heart disease of native coronary artery with unspecified angina pectoris: Secondary | ICD-10-CM | POA: Diagnosis not present

## 2019-10-29 DIAGNOSIS — G894 Chronic pain syndrome: Secondary | ICD-10-CM | POA: Diagnosis not present

## 2019-10-29 DIAGNOSIS — N182 Chronic kidney disease, stage 2 (mild): Secondary | ICD-10-CM | POA: Diagnosis not present

## 2019-10-29 DIAGNOSIS — N3281 Overactive bladder: Secondary | ICD-10-CM | POA: Diagnosis not present

## 2019-10-29 DIAGNOSIS — M170 Bilateral primary osteoarthritis of knee: Secondary | ICD-10-CM | POA: Diagnosis not present

## 2019-10-29 DIAGNOSIS — E119 Type 2 diabetes mellitus without complications: Secondary | ICD-10-CM | POA: Diagnosis not present

## 2019-10-29 DIAGNOSIS — J439 Emphysema, unspecified: Secondary | ICD-10-CM | POA: Diagnosis not present

## 2019-11-03 DIAGNOSIS — M25511 Pain in right shoulder: Secondary | ICD-10-CM | POA: Diagnosis not present

## 2019-11-05 DIAGNOSIS — N182 Chronic kidney disease, stage 2 (mild): Secondary | ICD-10-CM | POA: Diagnosis not present

## 2019-11-05 DIAGNOSIS — G894 Chronic pain syndrome: Secondary | ICD-10-CM | POA: Diagnosis not present

## 2019-11-05 DIAGNOSIS — I25119 Atherosclerotic heart disease of native coronary artery with unspecified angina pectoris: Secondary | ICD-10-CM | POA: Diagnosis not present

## 2019-11-05 DIAGNOSIS — J439 Emphysema, unspecified: Secondary | ICD-10-CM | POA: Diagnosis not present

## 2019-11-05 DIAGNOSIS — M160 Bilateral primary osteoarthritis of hip: Secondary | ICD-10-CM | POA: Diagnosis not present

## 2019-11-05 DIAGNOSIS — M5134 Other intervertebral disc degeneration, thoracic region: Secondary | ICD-10-CM | POA: Diagnosis not present

## 2019-11-05 DIAGNOSIS — M7501 Adhesive capsulitis of right shoulder: Secondary | ICD-10-CM | POA: Diagnosis not present

## 2019-11-05 DIAGNOSIS — I129 Hypertensive chronic kidney disease with stage 1 through stage 4 chronic kidney disease, or unspecified chronic kidney disease: Secondary | ICD-10-CM | POA: Diagnosis not present

## 2019-11-05 DIAGNOSIS — M170 Bilateral primary osteoarthritis of knee: Secondary | ICD-10-CM | POA: Diagnosis not present

## 2019-11-10 DIAGNOSIS — M25511 Pain in right shoulder: Secondary | ICD-10-CM | POA: Diagnosis not present

## 2019-11-10 DIAGNOSIS — M542 Cervicalgia: Secondary | ICD-10-CM | POA: Diagnosis not present

## 2019-11-11 DIAGNOSIS — I129 Hypertensive chronic kidney disease with stage 1 through stage 4 chronic kidney disease, or unspecified chronic kidney disease: Secondary | ICD-10-CM | POA: Diagnosis not present

## 2019-11-11 DIAGNOSIS — I25119 Atherosclerotic heart disease of native coronary artery with unspecified angina pectoris: Secondary | ICD-10-CM | POA: Diagnosis not present

## 2019-11-11 DIAGNOSIS — J439 Emphysema, unspecified: Secondary | ICD-10-CM | POA: Diagnosis not present

## 2019-11-11 DIAGNOSIS — M170 Bilateral primary osteoarthritis of knee: Secondary | ICD-10-CM | POA: Diagnosis not present

## 2019-11-11 DIAGNOSIS — M5134 Other intervertebral disc degeneration, thoracic region: Secondary | ICD-10-CM | POA: Diagnosis not present

## 2019-11-11 DIAGNOSIS — M160 Bilateral primary osteoarthritis of hip: Secondary | ICD-10-CM | POA: Diagnosis not present

## 2019-11-11 DIAGNOSIS — M7501 Adhesive capsulitis of right shoulder: Secondary | ICD-10-CM | POA: Diagnosis not present

## 2019-11-11 DIAGNOSIS — N182 Chronic kidney disease, stage 2 (mild): Secondary | ICD-10-CM | POA: Diagnosis not present

## 2019-11-11 DIAGNOSIS — G894 Chronic pain syndrome: Secondary | ICD-10-CM | POA: Diagnosis not present

## 2019-11-13 DIAGNOSIS — N182 Chronic kidney disease, stage 2 (mild): Secondary | ICD-10-CM | POA: Diagnosis not present

## 2019-11-13 DIAGNOSIS — M5134 Other intervertebral disc degeneration, thoracic region: Secondary | ICD-10-CM | POA: Diagnosis not present

## 2019-11-13 DIAGNOSIS — I25119 Atherosclerotic heart disease of native coronary artery with unspecified angina pectoris: Secondary | ICD-10-CM | POA: Diagnosis not present

## 2019-11-13 DIAGNOSIS — M160 Bilateral primary osteoarthritis of hip: Secondary | ICD-10-CM | POA: Diagnosis not present

## 2019-11-13 DIAGNOSIS — G894 Chronic pain syndrome: Secondary | ICD-10-CM | POA: Diagnosis not present

## 2019-11-13 DIAGNOSIS — M7501 Adhesive capsulitis of right shoulder: Secondary | ICD-10-CM | POA: Diagnosis not present

## 2019-11-13 DIAGNOSIS — I129 Hypertensive chronic kidney disease with stage 1 through stage 4 chronic kidney disease, or unspecified chronic kidney disease: Secondary | ICD-10-CM | POA: Diagnosis not present

## 2019-11-13 DIAGNOSIS — J439 Emphysema, unspecified: Secondary | ICD-10-CM | POA: Diagnosis not present

## 2019-11-13 DIAGNOSIS — M170 Bilateral primary osteoarthritis of knee: Secondary | ICD-10-CM | POA: Diagnosis not present

## 2019-11-14 DIAGNOSIS — M542 Cervicalgia: Secondary | ICD-10-CM | POA: Diagnosis not present

## 2019-11-17 DIAGNOSIS — G629 Polyneuropathy, unspecified: Secondary | ICD-10-CM | POA: Diagnosis not present

## 2019-11-17 DIAGNOSIS — M25511 Pain in right shoulder: Secondary | ICD-10-CM | POA: Diagnosis not present

## 2019-11-20 DIAGNOSIS — G8321 Monoplegia of upper limb affecting right dominant side: Secondary | ICD-10-CM | POA: Diagnosis not present

## 2019-11-20 DIAGNOSIS — M25511 Pain in right shoulder: Secondary | ICD-10-CM | POA: Diagnosis not present

## 2019-12-02 ENCOUNTER — Ambulatory Visit (INDEPENDENT_AMBULATORY_CARE_PROVIDER_SITE_OTHER): Payer: Medicare HMO | Admitting: Neurology

## 2019-12-02 ENCOUNTER — Encounter: Payer: Self-pay | Admitting: Neurology

## 2019-12-02 ENCOUNTER — Other Ambulatory Visit: Payer: Self-pay

## 2019-12-02 VITALS — BP 139/83 | HR 52 | Temp 97.5°F | Ht 69.0 in | Wt 167.0 lb

## 2019-12-02 DIAGNOSIS — G545 Neuralgic amyotrophy: Secondary | ICD-10-CM | POA: Diagnosis not present

## 2019-12-02 DIAGNOSIS — G540 Brachial plexus disorders: Secondary | ICD-10-CM | POA: Diagnosis not present

## 2019-12-02 NOTE — Progress Notes (Signed)
Subjective:    Patient ID: Phyllis Caldwell is a 78 y.o. female.  HPI    Star Age, MD, PhD [facility] [address], [address] [place], [place] [address]  Dear Dr. Delilah Shan,   I saw your patient, Phyllis Caldwell, upon your kind request in my neurologic clinic today for initial consultation of her right shoulder pain.  The patient is a accompanied by her GS today.  As you know, [name] is a 78 year old right-handed woman with an underlying medical history of thyroid disease, smoking, hypertension, depression, coronary artery disease, cataracts, asthma, COPD, and mildly overweight state, who reports pain and difficulty with mobility of her right shoulder.  Symptoms started gradually some 3 to 6 months ago with pain in the right posterior shoulder area, right lateral neck area and also proximal arm, she started having difficulty with range of motion in her right arm.  About 2 to 3 months ago she felt a more sudden onset of weakness in her right shoulder girdle area and also right lateral neck.  She has noticed in the past week some left shoulder discomfort similar to the right but nowhere near as severe.  She has also longer standing problems with the left hip and left knee and has been using a single-point cane which is difficult to use with her right arm.  She denies any global weakness.  She denies any sudden onset of one-sided weakness or numbness or tingling.  She has no sensory symptoms.  She has no hand symptoms such as carpal tunnel type paresthesias or pain.  She recalls no recent injury, no falls reported.  She has been told that her right trapezius is weak.  She is widowed since 2012, lives with her 2 grandsons, has a total of 8 grandchildren, 1 daughter.  She quit smoking some 3 years ago and has a longstanding history of smoking in the past.  She drinks caffeine in the form of coffee, 1 cup/day and 2-3 servings of iced tea per day, not always as good with  her water intake but grandson tries to remind her on a regular basis.   I reviewed your office records.  She has been treated symptomatically for her pain with prescription pain medication.  She had some physical therapy for the right shoulder.  A brachial plexus MRI was discussed recently.   She had a recent EMG and nerve conduction velocity test through your office on 11/17/2019 and I reviewed the results: She had right median sensory and right ulnar sensory nerve prolonged distal peak latencies.  All remaining nerves were within normal limits.  All examined muscles showed no evidence of electrical instability.  She has had MRI examinations through your office, she had a MRI cervical spine without contrast on 11/14/2019 and I reviewed the results: Impression: C5-6 moderate right and prominent moderate left foraminal stenosis.  C6-7 prominent moderate right and moderate left foraminal stenosis.  She had a right shoulder MRI without contrast on 11/03/2019 and I reviewed the results: Impression: Mild diffuse supraspinatus and infraspinatus tendinosis with minimal interstitial tearing of the tendons.  No rotator cuff muscle atrophy.  Mild to moderate chronic arthritis of the acromioclavicular joint which does not deform the rotator cuff.  Mild diffuse degeneration of the glenoid labrum.  Probable mild chronic adhesive capsulitis.  Recommend clinical correlation.   She had a shoulder x-ray on 09/21/2019 and I reviewed the results: IMPRESSION: No acute osseous abnormality. She presented to urgent care on  09/21/2019 with a 1 month history of right shoulder pain and decreased range of motion.  I reviewed the urgent care records.  She was using Vicodin as needed and was given a prescription for prednisone.   Her Past Medical History Is Significant For: Past Medical History:  Diagnosis Date  . Asthma   . Cataract   . COPD (chronic obstructive pulmonary disease) (HCC)   . Coronary artery disease   . Depression    . Hypertension   . Poor dentition   . Thyroid disease   . Tobacco use     Her Past Surgical History Is Significant For: Past Surgical History:  Procedure Laterality Date  . ABDOMINAL HYSTERECTOMY    . LEFT HEART CATHETERIZATION WITH CORONARY ANGIOGRAM N/A 10/07/2012   Procedure: LEFT HEART CATHETERIZATION WITH CORONARY ANGIOGRAM;  Surgeon: Robynn Pane, MD;  Location: [facility] CATH LAB;  Service: Cardiovascular;  Laterality: N/A;  . THYROID SURGERY      Her Family History Is Significant For: Family History  Problem Relation Age of Onset  . Heart disease Mother   . Heart attack Mother   . Stroke Mother   . Heart attack Father   . Prostate cancer Father     Her Social History Is Significant For: Social History   Socioeconomic History  . Marital status: Widowed    Spouse name: Not on file  . Number of children: Not on file  . Years of education: Not on file  . Highest education level: Not on file  Occupational History  . Not on file  Tobacco Use  . Smoking status: Former Smoker    Packs/day: 1.00    Types: Cigarettes    Quit date: 11/01/2017    Years since quitting: 2.0  . Smokeless tobacco: Never Used  Substance and Sexual Activity  . Alcohol use: No  . Drug use: No  . Sexual activity: Never  Other Topics Concern  . Not on file  Social History Narrative  . Not on file   Social Determinants of Health   Financial Resource Strain:   . Difficulty of Paying Living Expenses: Not on file  Food Insecurity:   . Worried About Programme researcher, broadcasting/film/video in the Last Year: Not on file  . Ran Out of Food in the Last Year: Not on file  Transportation Needs:   . Lack of Transportation (Medical): Not on file  . Lack of Transportation (Non-Medical): Not on file  Physical Activity:   . Days of Exercise per Week: Not on file  . Minutes of Exercise per Session: Not on file  Stress:   . Feeling of Stress : Not on file  Social Connections:   . Frequency of Communication with  Friends and Family: Not on file  . Frequency of Social Gatherings with Friends and Family: Not on file  . Attends Religious Services: Not on file  . Active Member of Clubs or Organizations: Not on file  . Attends Banker Meetings: Not on file  . Marital Status: Not on file    Her Allergies Are:  Allergies  Allergen Reactions  . Bee Venom Hives  . Coconut Fatty Acids     unknown  . Codeine Nausea And Vomiting  . Penicillins     Patient doesn't remember  . Sulfa Antibiotics     unknown  :   Her Current Medications Are:  Outpatient Encounter Medications as of 12/02/2019  Medication Sig  . amLODipine (NORVASC) 5 MG tablet Take  1 tablet (5 mg total) by mouth daily. Reported on 01/05/2016  . aspirin EC 81 MG tablet Take 81 mg by mouth daily. Patient takes 2 daily  . cholecalciferol (VITAMIN D) 1000 units tablet Take 1,000 Units by mouth daily.  . Coenzyme Q-10 100 MG capsule Take 1 capsule (100 mg total) by mouth daily. (Patient taking differently: Take 100 mg by mouth 2 (two) times a week. )  . levothyroxine (SYNTHROID, LEVOTHROID) 50 MCG tablet Take 1 tablet (50 mcg total) by mouth daily before breakfast. Reported on 01/05/2016  . pantoprazole (PROTONIX) 40 MG tablet Take 1 tablet (40 mg total) by mouth daily at 12 noon. (Patient taking differently: Take 40 mg by mouth every morning. )  . rosuvastatin (CRESTOR) 10 MG tablet Take 10 mg by mouth 2 (two) times a week. t  . telmisartan (MICARDIS) 20 MG tablet Take 20 mg by mouth daily.  . vitamin B-12 (CYANOCOBALAMIN) 500 MCG tablet Take 500 mcg by mouth daily.  . [DISCONTINUED] aspirin 325 MG tablet Take 1 tablet (325 mg total) by mouth daily.  . [DISCONTINUED] predniSONE (STERAPRED UNI-PAK 21 TAB) 10 MG (21) TBPK tablet 6 tabs for 1 day, then 5 tabs for 1 das, then 4 tabs for 1 day, then 3 tabs for 1 day, 2 tabs for 1 day, then 1 tab for 1 day  . [DISCONTINUED] rosuvastatin (CRESTOR) 10 MG tablet Take 1 tablet (10 mg total) by  mouth daily. (Patient not taking: Reported on 01/26/2019)   No facility-administered encounter medications on file as of 12/02/2019.   Review of Systems:  Out of a complete 14 point review of systems, all are reviewed and negative with the exception of these symptoms as listed below:  Review of Systems  Neurological:       Pt presents today from orthro. They state ,"I'm a medical mystery." she states she has had 2 MRI's completed and "needles/electrodes" all in my arm(NCV?) and they can't explain why I am having difficulty with right arm. States unable to raise all the way and has to use her left arm to pick it up. She was told her trapezius muscle "was gone"    Objective:  Neurological Exam  Physical Exam Physical Examination:   Vitals:   12/02/19 0933  BP: 139/83  Pulse: (!) 52  Temp: (!) 97.5 F (36.4 C)    General Examination: The patient is a very pleasant 78 y.o. female in no acute distress. She appears well-developed and well-nourished and well groomed.   HEENT: Normocephalic, atraumatic, pupils are equal, round and reactive to light and accommodation. Corrective eyeglasses in place, extraocular tracking is well preserved, hearing is grossly intact, face is symmetric with normal facial animation and normal facial sensation.  Speech is clear without dysarthria, possible intermittent voice tremor noted.  She has no hypophonia, No lip, neck or jaw tremor, airway examination reveals moderate mouth dryness, tongue protrudes centrally in palate elevates symmetrically.  Marginal dental hygiene noted, no carotid bruits.   Chest: Clear to auscultation without wheezing, rhonchi or crackles noted.  Heart: S1+S2+0, regular and normal without murmurs, rubs or gallops noted.   Abdomen: Soft, non-tender and non-distended with normal bowel sounds appreciated on auscultation.  Extremities: There is no pitting edema in the distal lower extremities bilaterally. Pedal pulses are  intact.  Skin: Warm and dry without trophic changes noted. There are no varicose veins in the distal legs.  Musculoskeletal: exam reveals Significant decreased range of motion in the right shoulder area,  left knee discomfort, left hip discomfort with decreased range of motion in the left hip.   Neurologically:  Mental status: The patient is awake, alert and oriented in all 4 spheres. Her immediate and remote memory, attention, language skills and fund of knowledge are appropriate. There is no evidence of aphasia, agnosia, apraxia or anomia. Speech is clear with normal prosody and enunciation. Thought process is linear. Mood is normal and affect is normal.  Cranial nerves II - XII are as described above under HEENT exam. In addition: shoulder shrug is normal with equal shoulder height noted. Motor exam: Normal bulk, strength and tone is noted On the left hemibody, also in the right lower extremity but does have decreased muscle profile in the right shoulder girdle.  She has no obvious fasciculations, no obvious focal atrophy in the hands, strength is well preserved throughout with the exception of pain limitation with shoulder shrug and strength in the right shoulder area especially with right arm abduction. Reflexes are 1+ in the upper extremities, trace in the knees and ankles.  Toes are downgoing bilaterally, sensory exam is intact to light touch, temperature, and vibration in the upper and lower extremities.  Cerebellar testing shows no dysmetria or intention tremor, finger-to-nose is unremarkable, heel-to-shin unremarkable with the exception of decreased range of motion with the left leg.  Gait, station and balance: She stands with difficulty. She pushes herself up, primarily pushing up with the left arm, she requires no assistance.  She stands slightly wide-based, left toes pointed outwards just a little bit more than right, she walks with a single-point cane held on the right side, no obvious limp,  walks slowly and cautiously, turns slowly.  No shuffling noted. No foot drop.   Assessment and Plan:  In summary, Phyllis Caldwell is a very pleasant 78 y.o.-year old female with an underlying medical history of thyroid disease, smoking, hypertension, depression, coronary artery disease, cataracts, asthma, COPD, and mildly overweight state, who Presents for evaluation of her right shoulder pain and weakness.  On examination, she does have a decreased muscle profile in the right shoulder girdle area, also significant pain in the right shoulder, right lateral neck area and upper arm area, she also reports some anterior pain in the chest muscle area.  While her recent imaging testing with cervical spine MRI without contrast and shoulder MRI without contrast did not suggest any major injuries, she has some Degenerative changes in the cervical spine at the C5-6 and C6-7 areas which would not explain the severity of her symptoms.  In addition, she had a reasonably benign EMG and nerve conduction velocity test through your office recently which would speak against a radiculopathy or plexopathy.  Nevertheless, I would say that it is not impossible for her to have brachial plexus inflammation and I would like to proceed with laboratory testing in the form of blood work and a brachial plexus MRI.  We may have to involve A neuromuscular specialist if the need arises, we may also consider repeating her EMG and nerve conduction velocity test.  She is rather reluctant to repeat the electrophysiological test which was painful.  We mutually agreed to proceed with blood work first and a brachial plexus MRI with and without contrast.I plan to follow-up after testing.  We will call her with her test results as soon as we have them.  She also has some degenerative changes in the shoulder joint and shoulder muscle tendons but no obvious major injury in the rotator cuff.  I answered all their questions today and the patient and her  grandson were in agreement with the above outlined plan.  Thank you very much for allowing me to participate in the care of this nice patient. If I can be of any further assistance to you please do not hesitate to call me at [phone].  Sincerely,   Huston Foley, MD, PhD

## 2019-12-02 NOTE — Patient Instructions (Signed)
Although your EMG and nerve conduction velocity test recently was fairly benign, your history and presentation still could suggest brachial plexopathy or plexitis which is an inflammation of the brachial plexus.  I would like to consider repeating your EMG and nerve conduction velocity test; for now, let us proceed with blood work to look for inflammatory and autoimmune markers, muscle enzymes and we will also request a brachial plexus MRI through your insurance.  We will call you with your test results and follow-up after.  We may request input from a more specialized neurologist if needed in the subspecialty of neuromuscular diseases

## 2019-12-04 DIAGNOSIS — R278 Other lack of coordination: Secondary | ICD-10-CM | POA: Diagnosis not present

## 2019-12-04 DIAGNOSIS — Z9181 History of falling: Secondary | ICD-10-CM | POA: Diagnosis not present

## 2019-12-04 DIAGNOSIS — M6281 Muscle weakness (generalized): Secondary | ICD-10-CM | POA: Diagnosis not present

## 2019-12-06 LAB — B12 AND FOLATE PANEL
Folate: 6.2 ng/mL (ref >3.0)
Vitamin B-12: 1189 pg/mL (ref 232–1245)

## 2019-12-06 LAB — COMPREHENSIVE METABOLIC PANEL
ALT: 7 IU/L (ref 0–32)
AST: 13 IU/L (ref 0–40)
Albumin/Globulin Ratio: 1.8 (ref 1.2–2.2)
Albumin: 3.9 g/dL (ref 3.7–4.7)
Alkaline Phosphatase: 100 IU/L (ref 39–117)
BUN/Creatinine Ratio: 20 (ref 12–28)
BUN: 20 mg/dL (ref 8–27)
Bilirubin Total: 0.5 mg/dL (ref 0.0–1.2)
CO2: 23 mmol/L (ref 20–29)
Calcium: 10.1 mg/dL (ref 8.7–10.3)
Chloride: 108 mmol/L — ABNORMAL HIGH (ref 96–106)
Creatinine, Ser: 1.01 mg/dL — ABNORMAL HIGH (ref 0.57–1.00)
GFR calc Af Amer: 62 mL/min/{1.73_m2} (ref >59)
GFR calc non Af Amer: 53 mL/min/{1.73_m2} — ABNORMAL LOW (ref >59)
Globulin, Total: 2.2 g/dL (ref 1.5–4.5)
Glucose: 88 mg/dL (ref 65–99)
Potassium: 5 mmol/L (ref 3.5–5.2)
Sodium: 142 mmol/L (ref 134–144)
Total Protein: 6.1 g/dL (ref 6.0–8.5)

## 2019-12-06 LAB — RHEUMATOID FACTOR: Rheumatoid fact SerPl-aCnc: <10 IU/mL (ref 0.0–13.9)

## 2019-12-06 LAB — VITAMIN B6: Vitamin B6: 5.5 ug/L (ref 2.0–32.8)

## 2019-12-06 LAB — ANA W/REFLEX: Anti Nuclear Antibody (ANA): NEGATIVE

## 2019-12-06 LAB — SEDIMENTATION RATE: Sed Rate: 2 mm/hr (ref 0–40)

## 2019-12-06 LAB — CK: Total CK: 29 U/L — ABNORMAL LOW (ref 32–182)

## 2019-12-06 LAB — TSH: TSH: 1.74 u[IU]/mL (ref 0.450–4.500)

## 2019-12-06 LAB — HGB A1C W/O EAG: Hgb A1c MFr Bld: 5.4 % (ref 4.8–5.6)

## 2019-12-06 LAB — C-REACTIVE PROTEIN: CRP: <1 mg/L (ref 0–10)

## 2019-12-08 ENCOUNTER — Telehealth: Payer: Self-pay | Admitting: *Deleted

## 2019-12-08 NOTE — Telephone Encounter (Signed)
Called and spoke with pt about lab results. Pt verbalized understanding. Advised MRI order sent to [facility]. They should call her to schedule appt. If not, advised her to call [phone]. She wrote down number.

## 2019-12-08 NOTE — Telephone Encounter (Signed)
-----   Message from Larey Seat, MD sent at 12/08/2019  1:03 PM EST ----- Low Ck, not abnormal.

## 2019-12-08 NOTE — Progress Notes (Signed)
Low Ck, not abnormal.

## 2019-12-16 ENCOUNTER — Telehealth: Payer: Self-pay

## 2019-12-16 DIAGNOSIS — G540 Brachial plexus disorders: Secondary | ICD-10-CM

## 2019-12-16 DIAGNOSIS — G545 Neuralgic amyotrophy: Secondary | ICD-10-CM

## 2019-12-16 NOTE — Telephone Encounter (Signed)
Patient called stating she was advised by [facility] that they have not received imaging orders or PA.  Please follow up

## 2019-12-17 NOTE — Telephone Encounter (Signed)
I reached out to [facility] and spoke with Clarita Crane, they confirmed they have the order and authorization is complete, they are going to reach out to the patient to schedule. DWD

## 2019-12-17 NOTE — Telephone Encounter (Signed)
[name] from [facility] called stating that Brachial Plexus can not be seen in a Cervical spine, it needs to be a MRI Chest w/wo Contrast. If any questions please call her back at [phone]

## 2019-12-17 NOTE — Telephone Encounter (Signed)
Please ask them to cancel MRI c spine, MRI chest ordered.

## 2019-12-18 NOTE — Telephone Encounter (Signed)
Noted, thank you. DWD  

## 2019-12-18 NOTE — Telephone Encounter (Signed)
Left vm for [name] that order was change to MR chest with without contrast. LEft number of [phone] to call back for any questions.

## 2019-12-25 ENCOUNTER — Ambulatory Visit
Admission: RE | Admit: 2019-12-25 | Discharge: 2019-12-25 | Disposition: A | Discharge status: Home / Self Care | Payer: Medicare HMO | Source: Ambulatory Visit | Attending: Neurology | Admitting: Neurology

## 2019-12-25 ENCOUNTER — Telehealth: Payer: Self-pay

## 2019-12-25 DIAGNOSIS — R531 Weakness: Secondary | ICD-10-CM | POA: Diagnosis not present

## 2019-12-25 DIAGNOSIS — G540 Brachial plexus disorders: Secondary | ICD-10-CM

## 2019-12-25 DIAGNOSIS — G545 Neuralgic amyotrophy: Secondary | ICD-10-CM

## 2019-12-25 MED ORDER — GADOBENATE DIMEGLUMINE 529 MG/ML IV SOLN
15.0000 mL | Freq: Once | INTRAVENOUS | Status: AC | PRN
  Administered 2019-12-25: 15 mL via INTRAVENOUS

## 2019-12-25 NOTE — Telephone Encounter (Signed)
I reached out to the pt and advised of MRI results. Patient verbalized understanding but was adamant she did not want to repeat the EMG/NCS test. Pt was agreeable to following up with the orthopedic specialist and voiced appreciation for the call.

## 2019-12-25 NOTE — Telephone Encounter (Signed)
-----   Message from Huston Foley, MD sent at 12/25/2019  3:51 PM EST ----- Please call patient regarding her recent MRI of the chest to look at the right brachial plexus.  Thankfully, there was no significant abnormality in the right brachial plexus, they did see some degenerative changes in the neck and recommended a cervical spine MRI needed.  She already had a cervical spine MRI through her orthopedic doctor in December 2020.  They also saw arthritis in the right shoulder joint on the chest MRI.  I would recommend that she follow-up with her orthopedic surgeon.  We can consider repeating her EMG and nerve conduction velocity test which she also had through her orthopedic doctor in which reluctant to repeat at when I saw her in clinic.  I would be happy to her request a repeat study in our office to look at any other reason for her to have weakness increasingly in the right shoulder area. So far, there is no obvious neurological cause, labs were also benign in December. Please let me know if she would like to proceed with EMG testing through our office.  Otherwise, I recommend she follow-up with her orthopedic doctor.

## 2019-12-25 NOTE — Progress Notes (Signed)
Please call patient regarding her recent MRI of the chest to look at the right brachial plexus.  Thankfully, there was no significant abnormality in the right brachial plexus, they did see some degenerative changes in the neck and recommended a cervical spine MRI needed.  She already had a cervical spine MRI through her orthopedic doctor in December 2020.  They also saw arthritis in the right shoulder joint on the chest MRI.  I would recommend that she follow-up with her orthopedic surgeon.  We can consider repeating her EMG and nerve conduction velocity test which she also had through her orthopedic doctor in which reluctant to repeat at when I saw her in clinic.  I would be happy to her request a repeat study in our office to look at any other reason for her to have weakness increasingly in the right shoulder area. So far, there is no obvious neurological cause, labs were also benign in December. Please let me know if she would like to proceed with EMG testing through our office.  Otherwise, I recommend she follow-up with her orthopedic doctor.

## 2020-01-01 DIAGNOSIS — M25511 Pain in right shoulder: Secondary | ICD-10-CM | POA: Diagnosis not present

## 2020-01-01 DIAGNOSIS — G8321 Monoplegia of upper limb affecting right dominant side: Secondary | ICD-10-CM | POA: Diagnosis not present

## 2020-03-17 NOTE — Progress Notes (Signed)
Cardiology Office Note:    Date:  03/18/2020   ID:  [name], DOB 18-Apr-1941, MRN [id number]  PCP:  Mila Palmer, MD  Cardiologist:  No primary care provider on file.   Referring MD: Mila Palmer, MD   Chief Complaint  Patient presents with  . Chest Pain  . Shortness of Breath    History of Present Illness:    Phyllis Caldwell is a 79 y.o. female with a hx of anterior wall infarct due to occlusion of a small anterolateral branch(no PCI), 30-40% distal left main, non obstructive LAD and circumflex all less than 50%. There was a 60-70% ostial PDA noted. Other problems are COPD, essential hypertension, and tobacco use.  She is here today with complaints of dyspnea on exertion, orthopnea, lower extremity swelling (mild).  She has also had nocturnal episodes of tachycardia/palpitations that she feels in her throat.  This happens at least once a week.  There are many other vague complaints that include chest and left arm pain that occur randomly and lasts less than 5 minutes.  These episodes not precipitated by activity.  She feels tired all the time and does not sleep well.   Past Medical History:  Diagnosis Date  . Asthma   . Cataract   . COPD (chronic obstructive pulmonary disease) (HCC)   . Coronary artery disease   . Depression   . Hypertension   . Poor dentition   . Thyroid disease   . Tobacco use     Past Surgical History:  Procedure Laterality Date  . ABDOMINAL HYSTERECTOMY    . LEFT HEART CATHETERIZATION WITH CORONARY ANGIOGRAM N/A 10/07/2012   Procedure: LEFT HEART CATHETERIZATION WITH CORONARY ANGIOGRAM;  Surgeon: Robynn Pane, MD;  Location: [facility] CATH LAB;  Service: Cardiovascular;  Laterality: N/A;  . THYROID SURGERY      Current Medications: Current Meds  Medication Sig  . amLODipine (NORVASC) 5 MG tablet Take 1 tablet (5 mg total) by mouth daily. Reported on 01/05/2016  . aspirin EC 81 MG tablet Take 81 mg by mouth daily. Patient takes 2  daily  . cholecalciferol (VITAMIN D) 1000 units tablet Take 1,000 Units by mouth daily.  . Coenzyme Q-10 100 MG capsule Take 1 capsule (100 mg total) by mouth daily.  Marland Kitchen HYDROcodone-acetaminophen (NORCO/VICODIN) 5-325 MG tablet Take 1 tablet by mouth as needed.  Marland Kitchen levothyroxine (SYNTHROID, LEVOTHROID) 50 MCG tablet Take 1 tablet (50 mcg total) by mouth daily before breakfast. Reported on 01/05/2016  . pantoprazole (PROTONIX) 40 MG tablet Take 1 tablet (40 mg total) by mouth daily at 12 noon.  . rosuvastatin (CRESTOR) 10 MG tablet Take 10 mg by mouth 2 (two) times a week. t  . telmisartan (MICARDIS) 20 MG tablet Take 20 mg by mouth daily.  . vitamin B-12 (CYANOCOBALAMIN) 500 MCG tablet Take 500 mcg by mouth daily.     Allergies:   Bee venom, Coconut fatty acids, Codeine, Penicillins, and Sulfa antibiotics   Social History   Socioeconomic History  . Marital status: Widowed    Spouse name: Not on file  . Number of children: Not on file  . Years of education: Not on file  . Highest education level: Not on file  Occupational History  . Not on file  Tobacco Use  . Smoking status: Former Smoker    Packs/day: 1.00    Types: Cigarettes    Quit date: 11/01/2017    Years since quitting: 2.3  . Smokeless tobacco: Never Used  Substance and Sexual Activity  . Alcohol use: No  . Drug use: No  . Sexual activity: Never  Other Topics Concern  . Not on file  Social History Narrative  . Not on file   Social Determinants of Health   Financial Resource Strain:   . Difficulty of Paying Living Expenses:   Food Insecurity:   . Worried About Programme researcher, broadcasting/film/video in the Last Year:   . Barista in the Last Year:   Transportation Needs:   . Freight forwarder (Medical):   Marland Kitchen Lack of Transportation (Non-Medical):   Physical Activity:   . Days of Exercise per Week:   . Minutes of Exercise per Session:   Stress:   . Feeling of Stress :   Social Connections:   . Frequency of  Communication with Friends and Family:   . Frequency of Social Gatherings with Friends and Family:   . Attends Religious Services:   . Active Member of Clubs or Organizations:   . Attends Banker Meetings:   Marland Kitchen Marital Status:      Family History: The patient's family history includes Heart attack in her father and mother; Heart disease in her mother; Prostate cancer in her father; Stroke in her mother.  ROS:   Please see the history of present illness.    She has not had syncope.  She has atrophy of her right trapezius muscle.  A neuromuscular evaluation is underway.  She has had left arm pain at times.  This is not precipitated by activity or any known circumstance.  She does have a history of snoring in the past and has had a sleep study that was negative for sleep apnea.  She has difficulty swallowing both solids and liquids.  All other systems reviewed and are negative.  EKGs/Labs/Other Studies Reviewed:    The following studies were reviewed today:  ECHOCARDIOGRAM 01/27/19 IMPRESSIONS  1. The left ventricle has normal systolic function with an ejection  fraction of 60-65%. The cavity size was normal. There is mildly increased  left ventricular wall thickness. Left ventricular diastolic Doppler  parameters are consistent with impaired  relaxation The E/e' is <8.  2. The right ventricle has normal systolic function. The cavity was  normal. There is no increase in right ventricular wall thickness.  3. Left atrial size was mildly dilated.  4. The mitral valve is degenerative. Mild calcification of the mitral  valve leaflet.  5. The tricuspid valve is normal in structure.  6. The aortic valve is tricuspid Mild sclerosis of the aortic valve.  Aortic valve regurgitation is trivial by color flow Doppler.  7. The inferior vena cava was dilated in size with >50% respiratory  variability.  8. Right atrial pressure is estimated at 8 mmHg.   EKG:  EKG normal sinus  rhythm, will appearance.  PR interval 130 ms.  Recent Labs: 12/02/2019: ALT 7; BUN 20; Creatinine, Ser 1.01; Potassium 5.0; Sodium 142; TSH 1.740  Recent Lipid Panel    Component Value Date/Time   CHOL 194 01/27/2019 0606   CHOL 181 04/23/2017 1404   TRIG 103 01/27/2019 0606   HDL 40 (L) 01/27/2019 0606   HDL 50 04/23/2017 1404   CHOLHDL 4.9 01/27/2019 0606   VLDL 21 01/27/2019 0606   LDLCALC 133 (H) 01/27/2019 0606   LDLCALC 115 (H) 04/23/2017 1404    Physical Exam:    VS:  BP (!) 142/88   Pulse 63   Ht 5'  9" (1.753 m)   Wt 171 lb 6.4 oz (77.7 kg)   SpO2 95%   BMI 25.31 kg/m     Wt Readings from Last 3 Encounters:  03/18/20 171 lb 6.4 oz (77.7 kg)  12/02/19 167 lb (75.8 kg)  01/26/19 169 lb 12.1 oz (77 kg)     GEN: Appears her stated age with a hint of frailty.. No acute distress HEENT: Normal NECK: No JVD. LYMPHATICS: No lymphadenopathy CARDIAC:  RRR without murmur, gallop, with bilateral trace pedal edema (she says this is mild). VASCULAR:  Normal Pulses. No bruits. RESPIRATORY:  Clear to auscultation without rales, wheezing or rhonchi  ABDOMEN: Soft, non-tender, non-distended, No pulsatile mass, MUSCULOSKELETAL: No deformity  SKIN: Warm and dry NEUROLOGIC:  Alert and oriented x 3 PSYCHIATRIC:  Normal affect   ASSESSMENT:    1. CAD in native artery   2. Essential hypertension   3. TIA (transient ischemic attack)   4. SOB (shortness of breath)   5. Tobacco use   6. Centrilobular emphysema (HCC)   7. Educated about COVID-19 virus infection   8. Palpitations    PLAN:    In order of problems listed above:  1. No symptoms really suggest angina.  Symptoms are atypical.  I cannot totally exclude angina but for the time being with normal appearing EKG, clinical observation is my recommendation. 2. Blood pressure is adequate given her age.  Low-salt diet is encouraged. 3. Not discussed 4. BNP will be done to rule out possible CHF especially given peripheral  edema. 5. Denies tobacco abuse currently 6. Shortness of breath could be related to emphysema.  BNP is obtained.  Refuses echocardiography at this time because of impact of her current medical bills from neuromuscular work-up. 7. Has received the COVID-19 vaccination. 8. We need to exclude atrial fibrillation.  4-week monitor.   Clinical follow-up in 8 to 12 weeks.  Prior to then if significant abnormalities are noted.   Medication Adjustments/Labs and Tests Ordered: Current medicines are reviewed at length with the patient today.  Concerns regarding medicines are outlined above.  Orders Placed This Encounter  Procedures  . Pro b natriuretic peptide  . Cardiac event monitor  . EKG 12-Lead   No orders of the defined types were placed in this encounter.   Patient Instructions  Medication Instructions:  Your physician recommends that you continue on your current medications as directed. Please refer to the Current Medication list given to you today.  *If you need a refill on your cardiac medications before your next appointment, please call your pharmacy*   Lab Work: Pro BNP today  If you have labs (blood work) drawn today and your tests are completely normal, you will receive your results only by: Marland Kitchen MyChart Message (if you have MyChart) OR . A paper copy in the mail If you have any lab test that is abnormal or we need to change your treatment, we will call you to review the results.   Testing/Procedures: Your physician has recommended that you wear an event monitor. Event monitors are medical devices that record the heart's electrical activity. Doctors most often [place] these monitors to diagnose arrhythmias. Arrhythmias are problems with the speed or rhythm of the heartbeat. The monitor is a small, portable device. You can wear one while you do your normal daily activities. This is usually used to diagnose what is causing palpitations/syncope (passing out).    Follow-Up: At [facility], you and your health needs are our priority.  As part of our continuing mission to provide you with exceptional heart care, we have created designated Provider Care Teams.  These Care Teams include your primary Cardiologist (physician) and Advanced Practice Providers (APPs -  Physician Assistants and Nurse Practitioners) who all work together to provide you with the care you need, when you need it.  We recommend signing up for the patient portal called "MyChart".  Sign up information is provided on this After Visit Summary.  MyChart is used to connect with patients for Virtual Visits (Telemedicine).  Patients are able to view lab/test results, encounter notes, upcoming appointments, etc.  Non-urgent messages can be sent to your provider as well.   To learn more about what you can do with MyChart, go to [email or web address].    Your next appointment:   2 month(s)  The format for your next appointment:   In Person  Provider:   You may see Dr. Daneen Schick or one of the following Advanced Practice Providers on your designated Care Team:    Truitt Merle, NP  Cecilie Kicks, NP  Kathyrn Drown, NP    Other Instructions      Signed, Sinclair Grooms, MD  03/18/2020 3:50 PM    [place]

## 2020-03-18 ENCOUNTER — Ambulatory Visit: Payer: Medicare HMO | Admitting: Interventional Cardiology

## 2020-03-18 ENCOUNTER — Encounter: Payer: Self-pay | Admitting: Interventional Cardiology

## 2020-03-18 ENCOUNTER — Other Ambulatory Visit: Payer: Self-pay

## 2020-03-18 ENCOUNTER — Telehealth: Payer: Self-pay | Admitting: Radiology

## 2020-03-18 VITALS — BP 142/88 | HR 63 | Ht 69.0 in | Wt 171.4 lb

## 2020-03-18 DIAGNOSIS — J432 Centrilobular emphysema: Secondary | ICD-10-CM | POA: Diagnosis not present

## 2020-03-18 DIAGNOSIS — Z7189 Other specified counseling: Secondary | ICD-10-CM | POA: Diagnosis not present

## 2020-03-18 DIAGNOSIS — R002 Palpitations: Secondary | ICD-10-CM

## 2020-03-18 DIAGNOSIS — I10 Essential (primary) hypertension: Secondary | ICD-10-CM

## 2020-03-18 DIAGNOSIS — R0602 Shortness of breath: Secondary | ICD-10-CM

## 2020-03-18 DIAGNOSIS — I2510 Atherosclerotic heart disease of native coronary artery without angina pectoris: Secondary | ICD-10-CM

## 2020-03-18 DIAGNOSIS — G459 Transient cerebral ischemic attack, unspecified: Secondary | ICD-10-CM | POA: Diagnosis not present

## 2020-03-18 DIAGNOSIS — Z720 Tobacco use: Secondary | ICD-10-CM | POA: Diagnosis not present

## 2020-03-18 NOTE — Patient Instructions (Signed)
Medication Instructions:  Your physician recommends that you continue on your current medications as directed. Please refer to the Current Medication list given to you today.  *If you need a refill on your cardiac medications before your next appointment, please call your pharmacy*   Lab Work: Pro BNP today  If you have labs (blood work) drawn today and your tests are completely normal, you will receive your results only by: Marland Kitchen MyChart Message (if you have MyChart) OR . A paper copy in the mail If you have any lab test that is abnormal or we need to change your treatment, we will call you to review the results.   Testing/Procedures: Your physician has recommended that you wear an event monitor. Event monitors are medical devices that record the heart's electrical activity. Doctors most often [place] these monitors to diagnose arrhythmias. Arrhythmias are problems with the speed or rhythm of the heartbeat. The monitor is a small, portable device. You can wear one while you do your normal daily activities. This is usually used to diagnose what is causing palpitations/syncope (passing out).    Follow-Up: At [facility], you and your health needs are our priority.  As part of our continuing mission to provide you with exceptional heart care, we have created designated Provider Care Teams.  These Care Teams include your primary Cardiologist (physician) and Advanced Practice Providers (APPs -  Physician Assistants and Nurse Practitioners) who all work together to provide you with the care you need, when you need it.  We recommend signing up for the patient portal called "MyChart".  Sign up information is provided on this After Visit Summary.  MyChart is used to connect with patients for Virtual Visits (Telemedicine).  Patients are able to view lab/test results, encounter notes, upcoming appointments, etc.  Non-urgent messages can be sent to your provider as well.   To learn more about what you can do  with MyChart, go to [email or web address].    Your next appointment:   2 month(s)  The format for your next appointment:   In Person  Provider:   You may see Dr. Verdis Prime or one of the following Advanced Practice Providers on your designated Care Team:    Norma Fredrickson, NP  Nada Boozer, NP  Georgie Chard, NP    Other Instructions

## 2020-03-18 NOTE — Telephone Encounter (Signed)
Enrolled patient for a 30 day [employer] Cardiac Event monitor to be mailed to patients home.  

## 2020-03-19 LAB — PRO B NATRIURETIC PEPTIDE: NT-Pro BNP: 271 pg/mL (ref 0–738)

## 2020-03-25 ENCOUNTER — Ambulatory Visit (INDEPENDENT_AMBULATORY_CARE_PROVIDER_SITE_OTHER): Payer: Medicare HMO

## 2020-03-25 DIAGNOSIS — R002 Palpitations: Secondary | ICD-10-CM | POA: Diagnosis not present

## 2020-05-11 NOTE — Progress Notes (Signed)
Virtual Visit via Telephone Note   This visit type was conducted due to national recommendations for restrictions regarding the COVID-19 Pandemic (e.g. social distancing) in an effort to limit this patient's exposure and mitigate transmission in our community.  Due to her co-morbid illnesses, this patient is at least at moderate risk for complications without adequate follow up.  This format is felt to be most appropriate for this patient at this time.  The patient did not have access to video technology/had technical difficulties with video requiring transitioning to audio format only (telephone).  All issues noted in this document were discussed and addressed.  No physical exam could be performed with this format.  Please refer to the patient's chart for her  consent to telehealth for [facility].   The patient was identified using 2 identifiers.  Date:  05/13/2020   ID:  Phyllis Caldwell, DOB [birth year], MRN [id number]  Patient Location: Home Provider Location: Office  PCP:  Mila Palmer, MD  Cardiologist:  No primary care provider on file.  Electrophysiologist:  None   Evaluation Performed:  Follow-Up Visit  Chief Complaint: Fatigue  History of Present Illness:    Phyllis Caldwell is a 79 y.o. female with hx of anterior wall infarct due to occlusion of a small anterolateral branch(no PCI), 30-40% distal left main, non obstructive LAD and circumflex all less than 50%. There was a 60-70% ostial PDA noted. Other problems are COPD, essential hypertension, and tobacco use.  She is feeling tired all the time.  She does not sleep well.  She believes she gets less than 4 hours of sleep per night.  From the time that she gets up until she goes to bed she feels she needs to rest.  She denies chest pain.  She is not using nitroglycerin.  She denies rapid heartbeat.  The patient does not have symptoms concerning for COVID-19 infection (fever, chills, cough, or new shortness of breath).     Past Medical History:  Diagnosis Date  . Asthma   . Cataract   . COPD (chronic obstructive pulmonary disease) (HCC)   . Coronary artery disease   . Depression   . Hypertension   . Poor dentition   . Thyroid disease   . Tobacco use    Past Surgical History:  Procedure Laterality Date  . ABDOMINAL HYSTERECTOMY    . LEFT HEART CATHETERIZATION WITH CORONARY ANGIOGRAM N/A 10/07/2012   Procedure: LEFT HEART CATHETERIZATION WITH CORONARY ANGIOGRAM;  Surgeon: Robynn Pane, MD;  Location: [facility] CATH LAB;  Service: Cardiovascular;  Laterality: N/A;  . THYROID SURGERY       Current Meds  Medication Sig  . amLODipine (NORVASC) 5 MG tablet Take 1 tablet (5 mg total) by mouth daily. Reported on 01/05/2016  . aspirin EC 81 MG tablet Take 81 mg by mouth daily. Patient takes 2 daily  . cholecalciferol (VITAMIN D) 1000 units tablet Take 1,000 Units by mouth daily.  Marland Kitchen HYDROcodone-acetaminophen (NORCO/VICODIN) 5-325 MG tablet Take 1 tablet by mouth as needed.  Marland Kitchen levothyroxine (SYNTHROID, LEVOTHROID) 50 MCG tablet Take 1 tablet (50 mcg total) by mouth daily before breakfast. Reported on 01/05/2016  . pantoprazole (PROTONIX) 40 MG tablet Take 1 tablet (40 mg total) by mouth daily at 12 noon.  Marland Kitchen telmisartan (MICARDIS) 20 MG tablet Take 20 mg by mouth daily.  . vitamin B-12 (CYANOCOBALAMIN) 500 MCG tablet Take 500 mcg by mouth daily.     Allergies:   Bee venom, Coconut fatty  acids, Codeine, Penicillins, and Sulfa antibiotics   Social History   Tobacco Use  . Smoking status: Former Smoker    Packs/day: 1.00    Types: Cigarettes    Quit date: 11/01/2017    Years since quitting: 2.5  . Smokeless tobacco: Never Used  Substance Use Topics  . Alcohol use: No  . Drug use: No     Family Hx: The patient's family history includes Heart attack in her father and mother; Heart disease in her mother; Prostate cancer in her father; Stroke in her mother.  ROS:   Please see the history of present  illness.    Fatigue, musculoskeletal pain, unhappy with quality of life. All other systems reviewed and are negative.   Prior CV studies:   The following studies were reviewed today:  Cardiac event monitor 05/10/2020: Study Highlights   NSR is basic rhythm  Five separate episodes of non sustained ventricular runs lasting 4-7 beats  No symptoms reported  Ventricular ectopic burden < 2%    Labs/Other Tests and Data Reviewed:    EKG:  No ECG reviewed.  Recent Labs: 12/02/2019: ALT 7; BUN 20; Creatinine, Ser 1.01; Potassium 5.0; Sodium 142; TSH 1.740 03/18/2020: NT-Pro BNP 271   Recent Lipid Panel Lab Results  Component Value Date/Time   CHOL 194 01/27/2019 06:06 AM   CHOL 181 04/23/2017 02:04 PM   TRIG 103 01/27/2019 06:06 AM   HDL 40 (L) 01/27/2019 06:06 AM   HDL 50 04/23/2017 02:04 PM   CHOLHDL 4.9 01/27/2019 06:06 AM   LDLCALC 133 (H) 01/27/2019 06:06 AM   LDLCALC 115 (H) 04/23/2017 02:04 PM    Wt Readings from Last 3 Encounters:  05/13/20 172 lb (78 kg)  03/18/20 171 lb 6.4 oz (77.7 kg)  12/02/19 167 lb (75.8 kg)     Objective:    Vital Signs:  BP 140/90   Pulse 87   Ht 5\' 9"  (1.753 m)   Wt 172 lb (78 kg)   BMI 25.40 kg/m    VITAL SIGNS:  reviewed None performed  ASSESSMENT & PLAN:    1. CAD in native artery   2. Essential hypertension   3. TIA (transient ischemic attack)   4. Tobacco use   5. Centrilobular emphysema (HCC)   6. Fatigue, unspecified type   7. Educated about COVID-19 virus infection    PLAN:  1. We briefly discussed secondary prevention 2. Blood pressure of 140/90 was recorded by the patient.  This is similar to prior.  I encouraged her to watch salt in her diet. 3. No new neurological symptoms 4. Not using tobacco 5. Dyspnea likely related to emphysema.  She needs to discuss with primary care. 6. Does not appear to be related to arrhythmia or heart failure.  I think her generalized fatigue is due to sleep deprivation.  This  needs to be discussed with primary care. 7. No symptoms of Covid.  Social distancing is recommended. 8.   COVID-19 Education: The signs and symptoms of COVID-19 were discussed with the patient and how to seek care for testing (follow up with PCP or arrange E-visit).  The importance of social distancing was discussed today.  Time:   Today, I have spent 10 minutes with the patient with telehealth technology discussing the above problems.     Medication Adjustments/Labs and Tests Ordered: Current medicines are reviewed at length with the patient today.  Concerns regarding medicines are outlined above.   Tests Ordered: No orders of the defined  types were placed in this encounter.   Medication Changes: No orders of the defined types were placed in this encounter.   Follow Up:  In Person prn  Signed, Sinclair Grooms, MD  05/13/2020 5:15 PM    [place]

## 2020-05-13 ENCOUNTER — Other Ambulatory Visit: Payer: Self-pay

## 2020-05-13 ENCOUNTER — Telehealth (INDEPENDENT_AMBULATORY_CARE_PROVIDER_SITE_OTHER): Payer: Medicare HMO | Admitting: Interventional Cardiology

## 2020-05-13 ENCOUNTER — Encounter: Payer: Self-pay | Admitting: Interventional Cardiology

## 2020-05-13 VITALS — BP 140/90 | HR 87 | Ht 69.0 in | Wt 172.0 lb

## 2020-05-13 DIAGNOSIS — R5383 Other fatigue: Secondary | ICD-10-CM

## 2020-05-13 DIAGNOSIS — I10 Essential (primary) hypertension: Secondary | ICD-10-CM

## 2020-05-13 DIAGNOSIS — J432 Centrilobular emphysema: Secondary | ICD-10-CM

## 2020-05-13 DIAGNOSIS — G459 Transient cerebral ischemic attack, unspecified: Secondary | ICD-10-CM

## 2020-05-13 DIAGNOSIS — I2510 Atherosclerotic heart disease of native coronary artery without angina pectoris: Secondary | ICD-10-CM

## 2020-05-13 DIAGNOSIS — Z720 Tobacco use: Secondary | ICD-10-CM | POA: Diagnosis not present

## 2020-05-13 DIAGNOSIS — Z7189 Other specified counseling: Secondary | ICD-10-CM | POA: Diagnosis not present

## 2020-05-13 NOTE — Patient Instructions (Signed)

## 2020-05-19 DIAGNOSIS — G894 Chronic pain syndrome: Secondary | ICD-10-CM | POA: Diagnosis not present

## 2020-05-19 DIAGNOSIS — J449 Chronic obstructive pulmonary disease, unspecified: Secondary | ICD-10-CM | POA: Diagnosis not present

## 2020-05-19 DIAGNOSIS — F331 Major depressive disorder, recurrent, moderate: Secondary | ICD-10-CM | POA: Diagnosis not present

## 2020-06-08 IMAGING — CT CT HEAD CODE STROKE
4 series · 16 of 47 positions shown, 18 images · non-contrast
Comparison: 09/09/2016.  08/11/2015.

CLINICAL DATA: Code stroke. Acute presentation with slurred speech
and dizziness.

EXAM:
CT HEAD WITHOUT CONTRAST
TECHNIQUE: Contiguous axial images were obtained from the base of the skull
through the vertex without intravenous contrast.

[Series 3: head wo · axial · 0.44mm/px · z∈[-102,+18]mm · 7 of 33 slices shown, 9 images]
[im 5/33  brain]
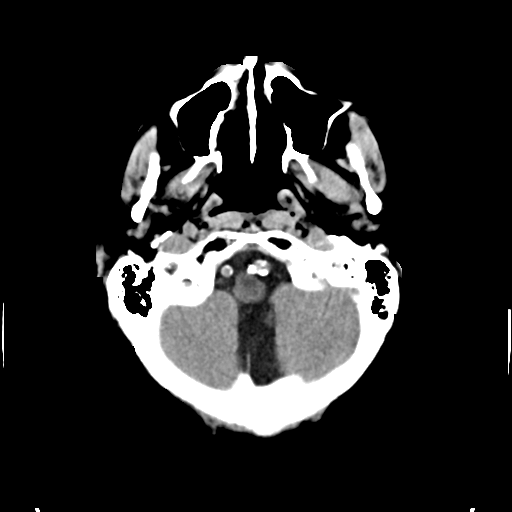
[im 5/33  bone]
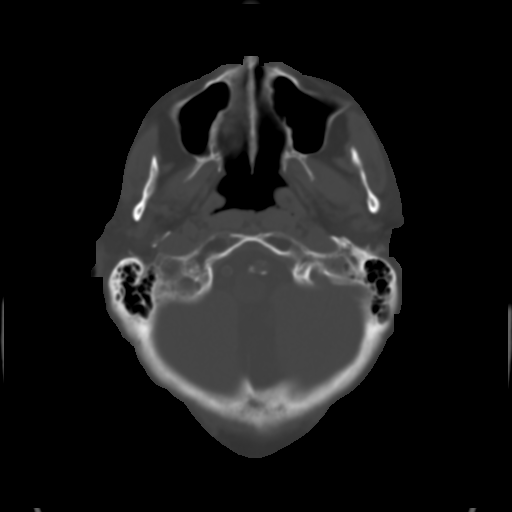
[im 9/33  brain]
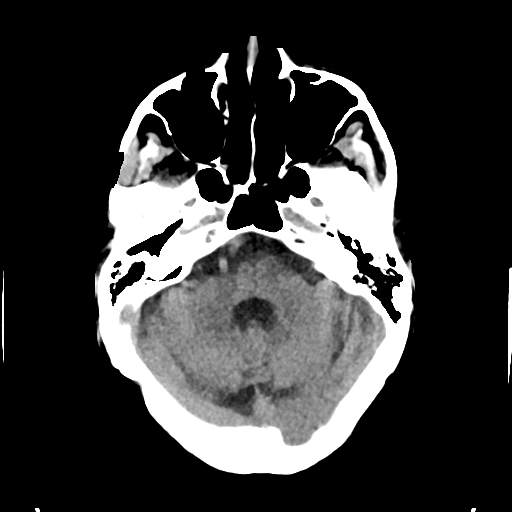
[im 13/33  brain]
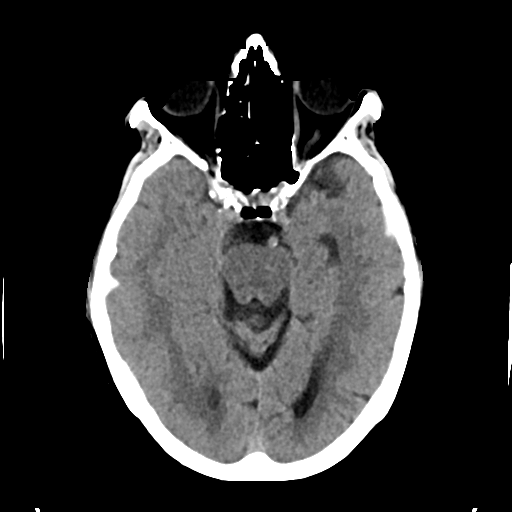
[im 17/33  brain]
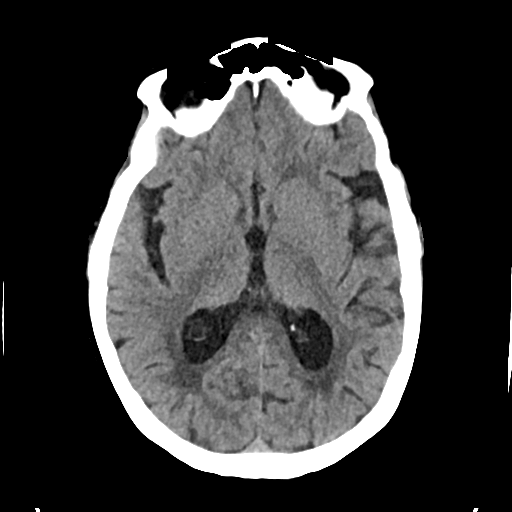
[im 21/33  brain]
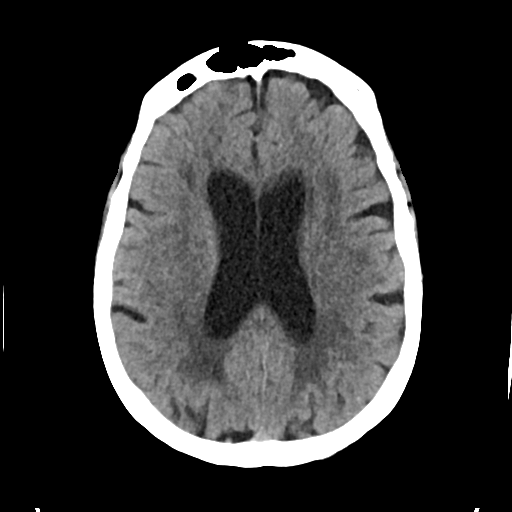
[im 21/33  bone]
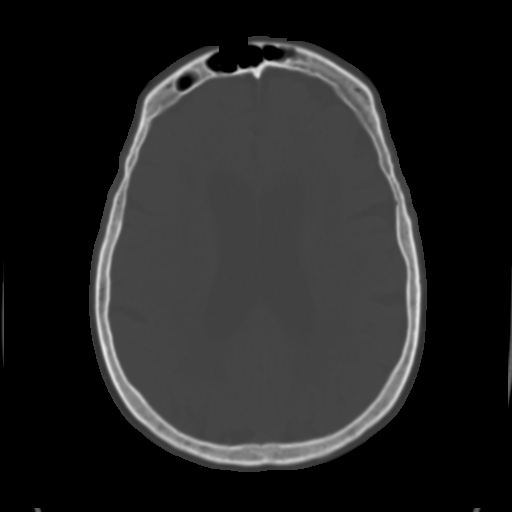
[im 25/33  brain]
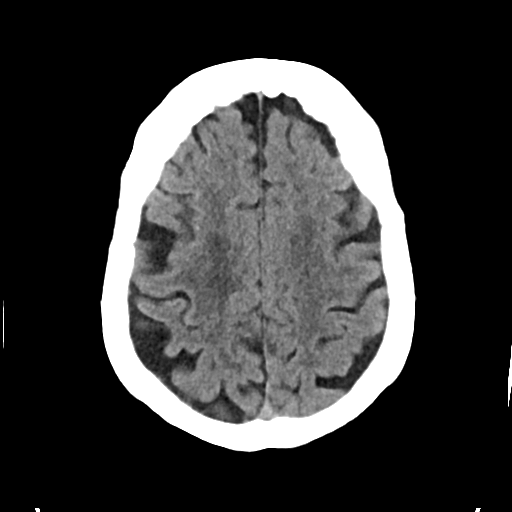
[im 29/33  brain]
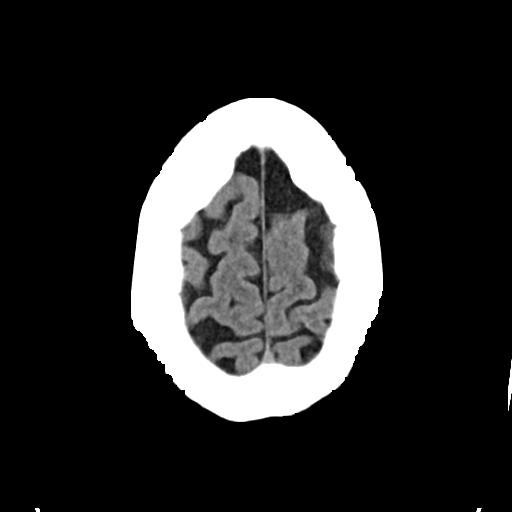

[Series 4: head bone · axial · 0.44mm/px · z∈[-106,-74]mm · 3 of 82 slices shown]
[im 9/82  bone]
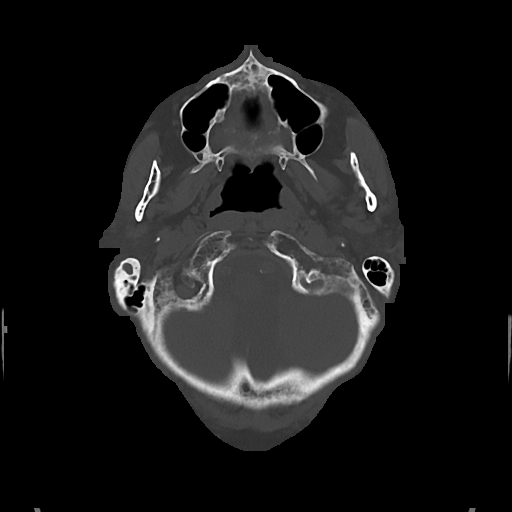
[im 17/82  bone]
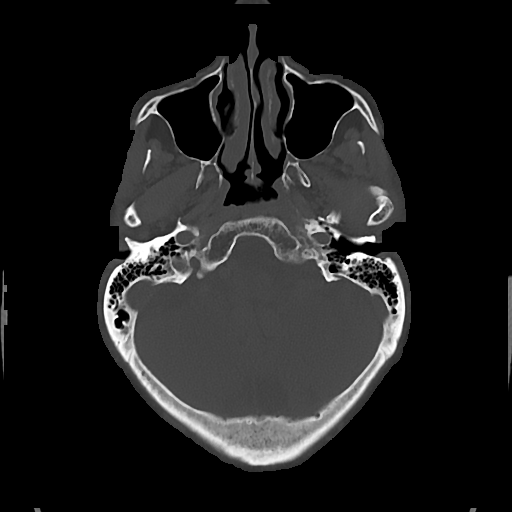
[im 25/82  bone]
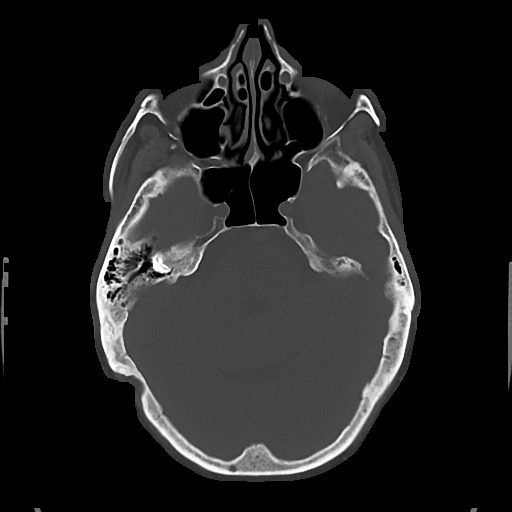

[Series 5: cor soft · coronal · 0.34mm/px · 3 of 67 slices shown]
[im 23/67  brain]
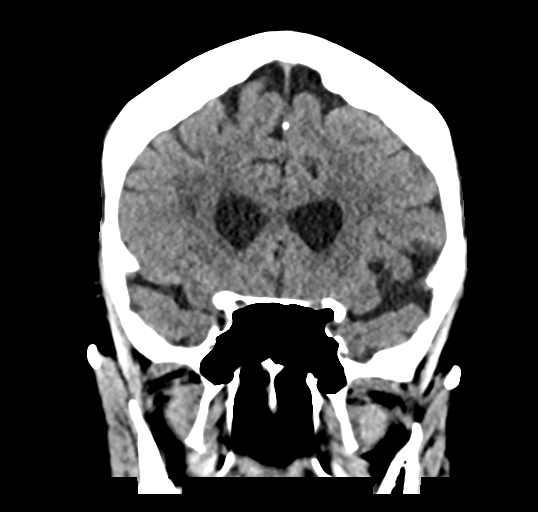
[im 30/67  brain]
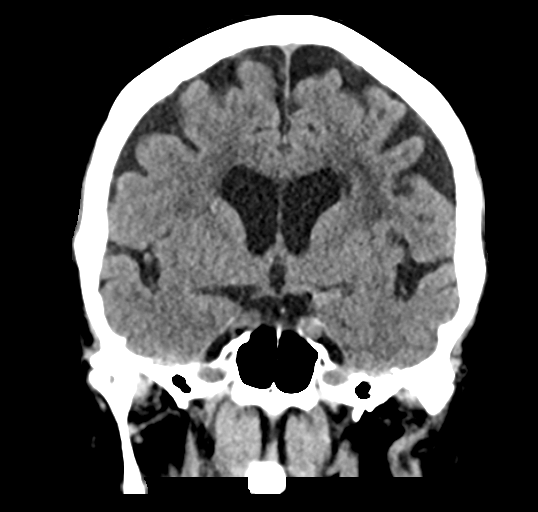
[im 37/67  brain]
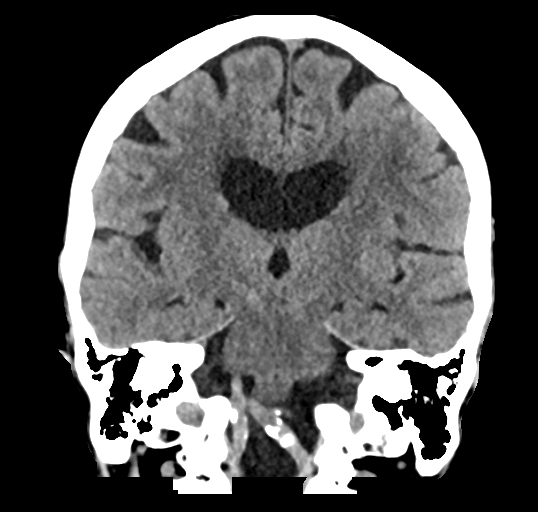

[Series 6: sag soft · sagittal · 0.35mm/px · 3 of 53 slices shown]
[im 18/53  brain]
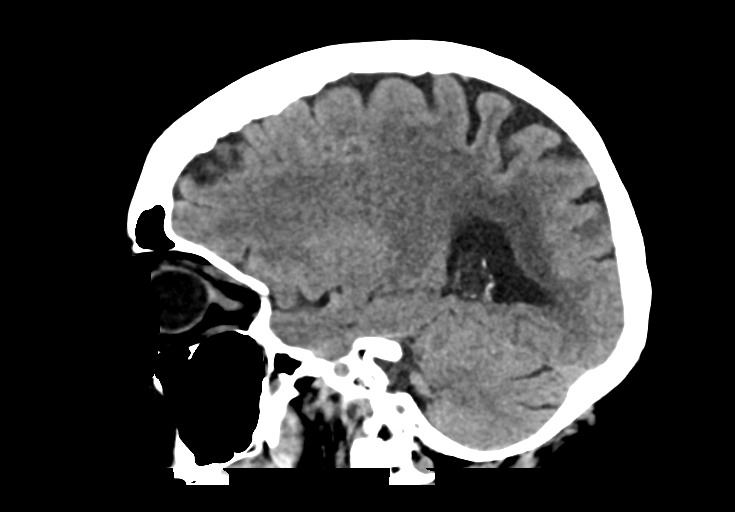
[im 27/53  brain]
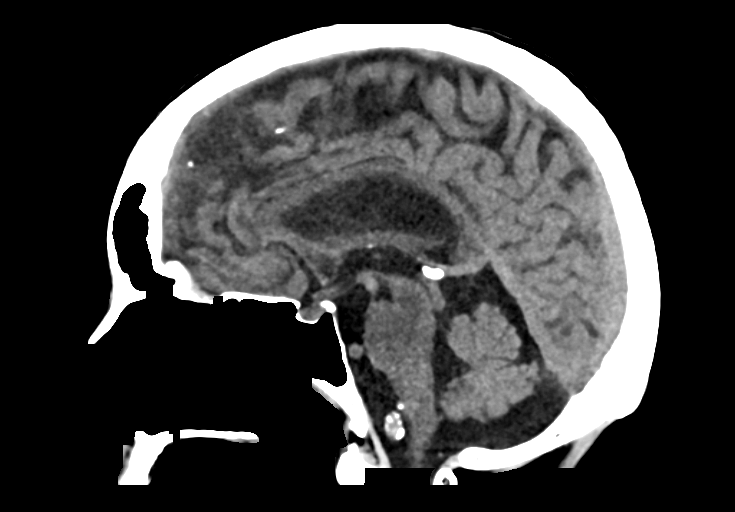
[im 35/53  brain]
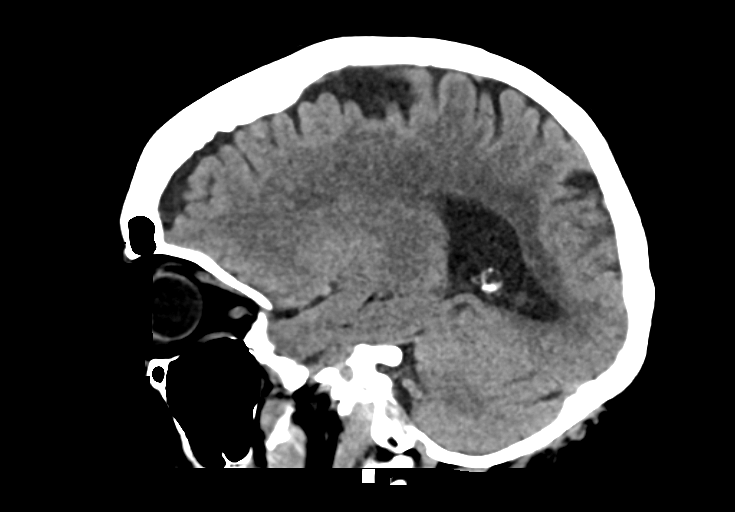

[16 of 47 positions shown; findings below may reference images not displayed]

FINDINGS: Brain: Chronic small-vessel ischemic changes affecting the pons in
present throughout the cerebral hemispheric deep and subcortical
white matter, progressive since 9142. No sign of acute infarction,
mass lesion, hemorrhage, hydrocephalus or extra-axial collection.

Vascular: There is atherosclerotic calcification of the major
vessels at the base of the brain.

Skull: Negative

Sinuses/Orbits: Clear/normal

Other: None

ASPECTS (Alberta Stroke Program Early CT Score)

- Ganglionic level infarction (caudate, lentiform nuclei, internal
capsule, insula, M1-M3 cortex): 7

- Supraganglionic infarction (M4-M6 cortex): 3

Total score (0-10 with 10 being normal): 10
IMPRESSION: 1. No acute finding by CT. Atrophy and extensive chronic
small-vessel ischemic changes, progressive since [DATE]. ASPECTS is 10.
3. These results were communicated to Dr. Chouhan at [DATE] [place]
01/26/2019by text page via the [employer] messaging system.

## 2020-06-08 IMAGING — MR MR MRA HEAD W/O CM
12 of 14 series · 35 of 48 positions shown · non-contrast
Comparison: CT earlier same day.

CLINICAL DATA: Acute presentation with slurred speech.

EXAM:
MRI HEAD WITHOUT CONTRAST
MRA HEAD WITHOUT CONTRAST
TECHNIQUE: Multiplanar, multiecho pulse sequences of the brain and surrounding
structures were obtained without intravenous contrast. Angiographic
images of the head were obtained using MRA technique without
contrast.

[Series 5: DWI · axial · 3.0mm · 0.88mm/px · z∈[-24,+117]mm · 7 of 100 slices shown (1 of 4)]
[im 1/100]
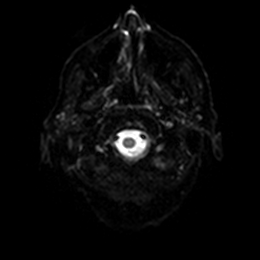
[im 17/100]
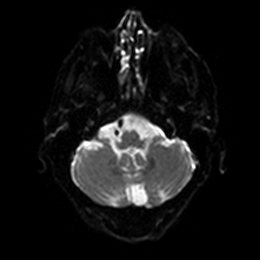
[im 34/100]
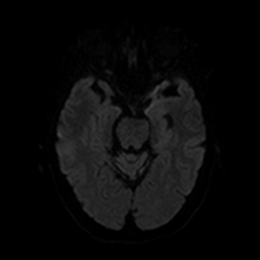
[im 50/100]
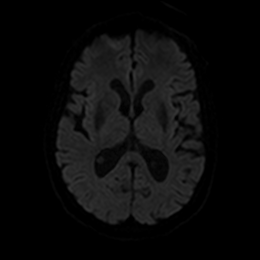
[im 67/100]
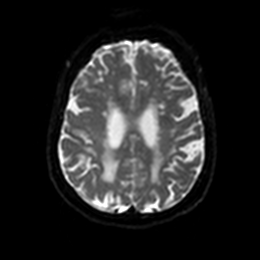
[im 83/100]
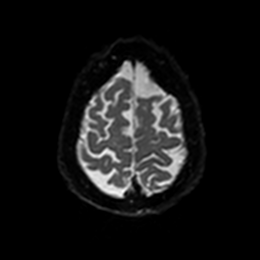
[im 100/100]
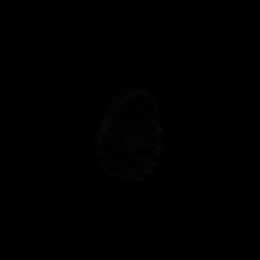

[Series 6: DWI · axial · 3.0mm · 0.88mm/px · z∈[-24,+117]mm · 4 of 50 slices shown (2 of 4)]
[im 1/50]
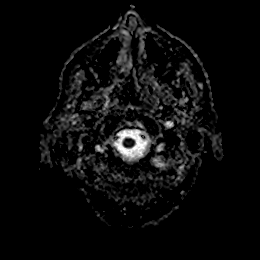
[im 17/50]
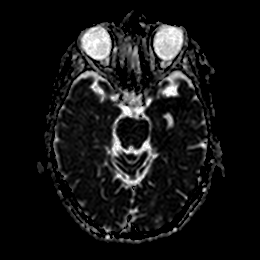
[im 33/50]
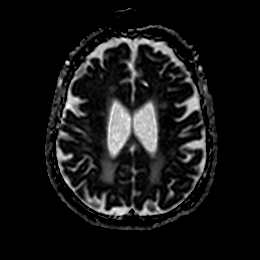
[im 50/50]
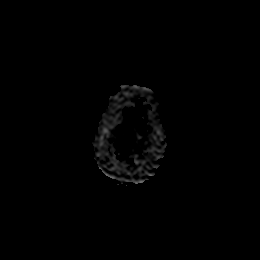

[Series 7: DWI · coronal · 4.0mm · 0.88mm/px · 4 of 72 slices shown (3 of 4)]
[im 1/72]
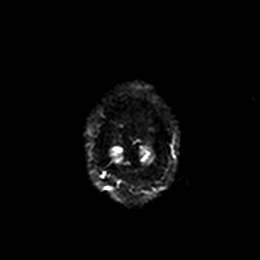
[im 24/72]
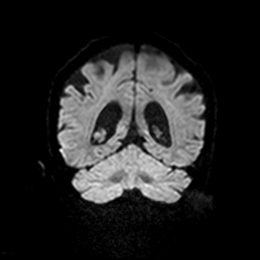
[im 48/72]
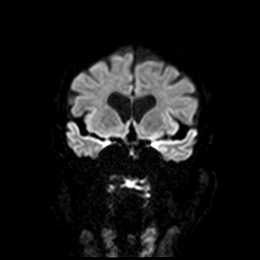
[im 72/72]
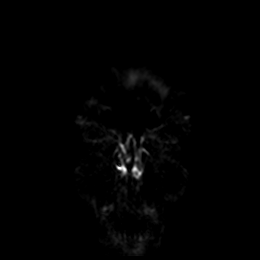

[Series 8: DWI · coronal · 4.0mm · 0.88mm/px · 2 of 36 slices shown (4 of 4)]
[im 1/36]
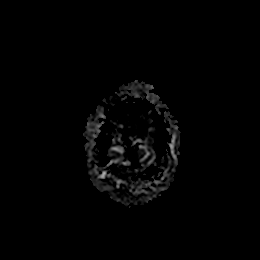
[im 36/36]
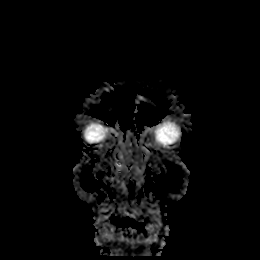

[Series 9: T1 · sagittal · 5.0mm · 0.75mm/px · 2 of 27 slices shown]
[im 1/27]
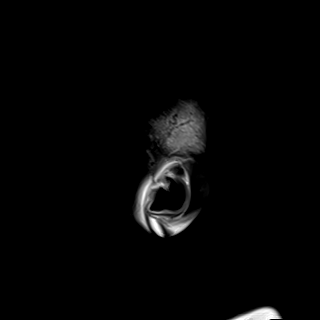
[im 27/27]
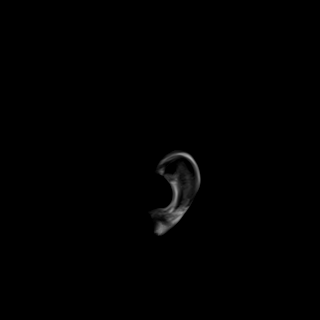

[Series 10: T2 · axial · 5.0mm · 0.72mm/px · 1 of 25 slices shown (1 of 2)]
[im 1/25]
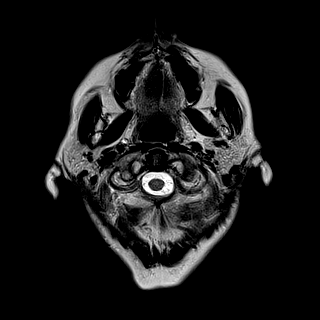

[Series 11: FLAIR · axial · 5.0mm · 0.45mm/px · 1 of 25 slices shown]
[im 1/25]
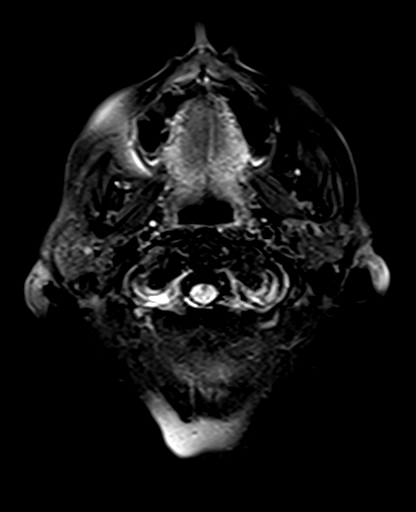

[Series 12: mag_images · axial · 3.0mm · 0.90mm/px · z∈[-36,+134]mm · 3 of 60 slices shown]
[im 1/60]
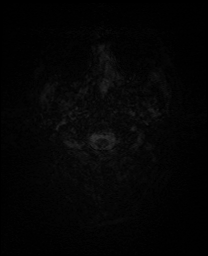
[im 30/60]
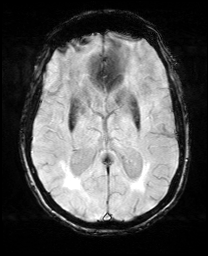
[im 60/60]
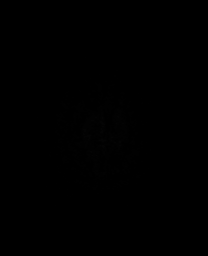

[Series 13: pha_images · axial · 3.0mm · 0.90mm/px · z∈[-36,+131]mm · 3 of 58 slices shown]
[im 1/58]
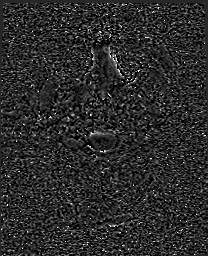
[im 29/58]
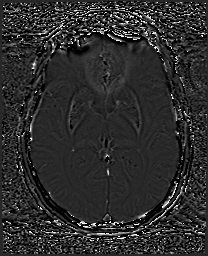
[im 58/58]
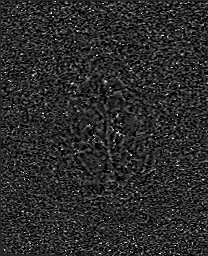

[Series 14: swi_images · axial · 3.0mm · 0.90mm/px · z∈[-36,+134]mm · 3 of 60 slices shown]
[im 1/60]
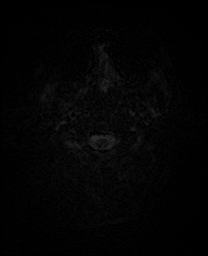
[im 30/60]
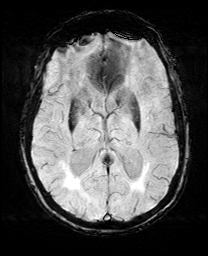
[im 60/60]
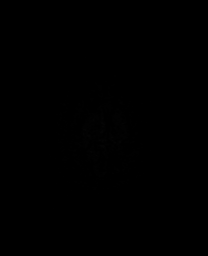

[Series 15: mip_images(sw) · axial · 24.0mm · 0.90mm/px · z∈[-26,+124]mm · 3 of 53 slices shown]
[im 1/53]
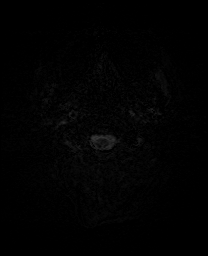
[im 27/53]
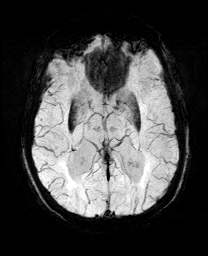
[im 53/53]
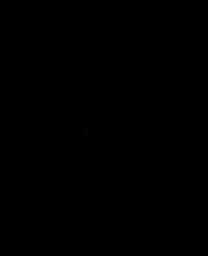

[Series 17: T2 · coronal · 5.0mm · 0.34mm/px · 2 of 29 slices shown (2 of 2)]
[im 1/29]
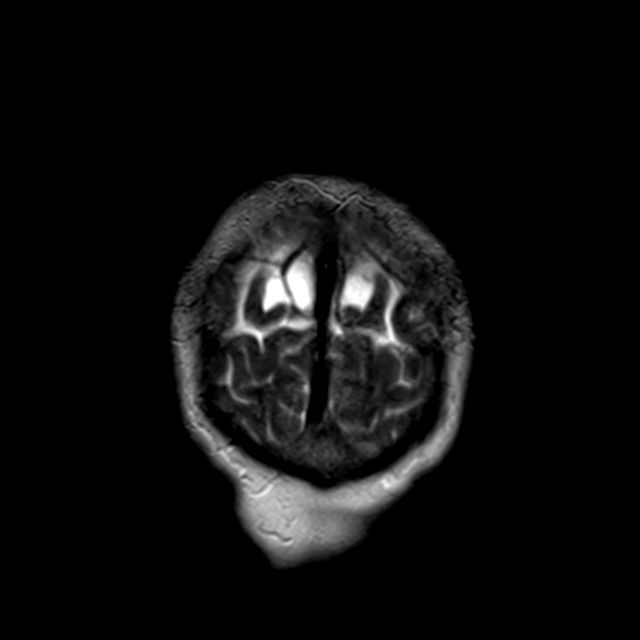
[im 29/29]
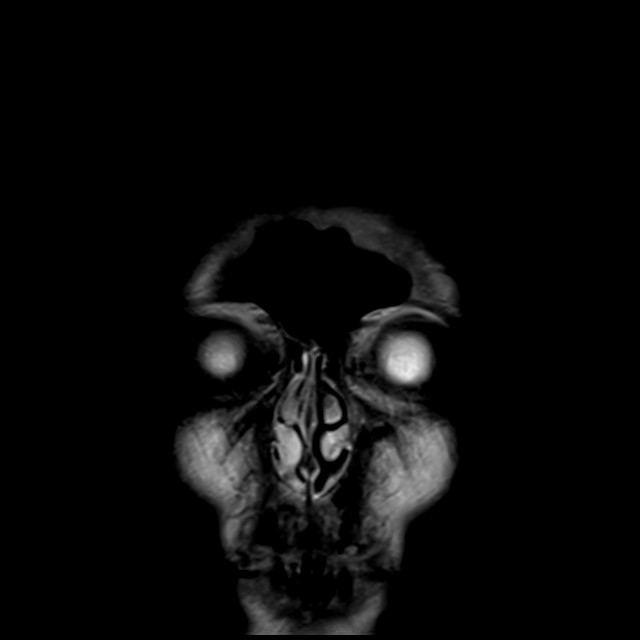

[35 of 48 positions shown; findings below may reference images not displayed]

FINDINGS: Both studies suffer from motion degradation.

MRI HEAD FINDINGS

Brain: Diffusion imaging does not show any acute or subacute
infarction. There chronic small-vessel ischemic changes of the pons.
No focal cerebellar insult. Cerebral hemispheres show pronounced
chronic small-vessel ischemic changes of the deep and subcortical
white matter. No cortical or large vessel territory infarction. No
mass lesion, hemorrhage, hydrocephalus or extra-axial collection.

Vascular: Major vessels at the base of the brain show flow.

Skull and upper cervical spine: Negative

Sinuses/Orbits: Clear/normal

Other: None

MRA HEAD FINDINGS

Both internal carotid arteries are patent through the skull base and
siphon regions. The anterior and middle cerebral vessels are patent.
There is either a right A1 stenosis or artifactual signal loss. Both
vertebral arteries are patent to the basilar. There is a 50%
stenosis of the right V4 segment. No stenosis on the left. No
basilar stenosis. Posterior circulation branch vessels show flow.
Mild ectasia of the basilar tip.
IMPRESSION: Motion degraded exams. Brain does not show any acute or subacute
infarction. There are extensive chronic small-vessel ischemic
changes of the cerebral hemispheric white matter.

No large or medium vessel intracranial occlusion. Question right A1
stenosis. Question right V4 stenosis.

## 2020-08-24 DIAGNOSIS — R739 Hyperglycemia, unspecified: Secondary | ICD-10-CM | POA: Diagnosis not present

## 2020-08-24 DIAGNOSIS — G540 Brachial plexus disorders: Secondary | ICD-10-CM | POA: Diagnosis not present

## 2020-08-24 DIAGNOSIS — E538 Deficiency of other specified B group vitamins: Secondary | ICD-10-CM | POA: Diagnosis not present

## 2020-08-24 DIAGNOSIS — E559 Vitamin D deficiency, unspecified: Secondary | ICD-10-CM | POA: Diagnosis not present

## 2020-08-24 DIAGNOSIS — Z79899 Other long term (current) drug therapy: Secondary | ICD-10-CM | POA: Diagnosis not present

## 2020-08-24 DIAGNOSIS — E785 Hyperlipidemia, unspecified: Secondary | ICD-10-CM | POA: Diagnosis not present

## 2020-12-08 ENCOUNTER — Emergency Department (HOSPITAL_COMMUNITY): Payer: Medicare HMO

## 2020-12-08 ENCOUNTER — Other Ambulatory Visit: Payer: Self-pay

## 2020-12-08 ENCOUNTER — Encounter (HOSPITAL_COMMUNITY): Payer: Self-pay | Admitting: Emergency Medicine

## 2020-12-08 ENCOUNTER — Inpatient Hospital Stay (HOSPITAL_COMMUNITY)
Admission: EM | Admit: 2020-12-08 | Discharge: 2020-12-11 | DRG: 177 | Disposition: A | Discharge status: Home Health | Payer: Medicare HMO | Attending: Internal Medicine | Admitting: Internal Medicine

## 2020-12-08 DIAGNOSIS — I2510 Atherosclerotic heart disease of native coronary artery without angina pectoris: Secondary | ICD-10-CM | POA: Diagnosis present

## 2020-12-08 DIAGNOSIS — Z882 Allergy status to sulfonamides status: Secondary | ICD-10-CM

## 2020-12-08 DIAGNOSIS — U071 COVID-19: Principal | ICD-10-CM | POA: Diagnosis present

## 2020-12-08 DIAGNOSIS — Z9861 Coronary angioplasty status: Secondary | ICD-10-CM | POA: Diagnosis not present

## 2020-12-08 DIAGNOSIS — Z8042 Family history of malignant neoplasm of prostate: Secondary | ICD-10-CM | POA: Diagnosis not present

## 2020-12-08 DIAGNOSIS — R0602 Shortness of breath: Secondary | ICD-10-CM | POA: Diagnosis not present

## 2020-12-08 DIAGNOSIS — E039 Hypothyroidism, unspecified: Secondary | ICD-10-CM | POA: Diagnosis present

## 2020-12-08 DIAGNOSIS — J9811 Atelectasis: Secondary | ICD-10-CM | POA: Diagnosis present

## 2020-12-08 DIAGNOSIS — N289 Disorder of kidney and ureter, unspecified: Secondary | ICD-10-CM

## 2020-12-08 DIAGNOSIS — Z885 Allergy status to narcotic agent status: Secondary | ICD-10-CM | POA: Diagnosis not present

## 2020-12-08 DIAGNOSIS — J1282 Pneumonia due to coronavirus disease 2019: Secondary | ICD-10-CM | POA: Diagnosis present

## 2020-12-08 DIAGNOSIS — I252 Old myocardial infarction: Secondary | ICD-10-CM | POA: Diagnosis not present

## 2020-12-08 DIAGNOSIS — R5383 Other fatigue: Secondary | ICD-10-CM | POA: Diagnosis not present

## 2020-12-08 DIAGNOSIS — Z880 Allergy status to penicillin: Secondary | ICD-10-CM

## 2020-12-08 DIAGNOSIS — Z8249 Family history of ischemic heart disease and other diseases of the circulatory system: Secondary | ICD-10-CM

## 2020-12-08 DIAGNOSIS — I10 Essential (primary) hypertension: Secondary | ICD-10-CM | POA: Diagnosis present

## 2020-12-08 DIAGNOSIS — J440 Chronic obstructive pulmonary disease with acute lower respiratory infection: Secondary | ICD-10-CM | POA: Diagnosis present

## 2020-12-08 DIAGNOSIS — Z91030 Bee allergy status: Secondary | ICD-10-CM

## 2020-12-08 DIAGNOSIS — R059 Cough, unspecified: Secondary | ICD-10-CM | POA: Diagnosis not present

## 2020-12-08 DIAGNOSIS — J9601 Acute respiratory failure with hypoxia: Secondary | ICD-10-CM | POA: Diagnosis present

## 2020-12-08 DIAGNOSIS — Z8673 Personal history of transient ischemic attack (TIA), and cerebral infarction without residual deficits: Secondary | ICD-10-CM

## 2020-12-08 DIAGNOSIS — Z87891 Personal history of nicotine dependence: Secondary | ICD-10-CM

## 2020-12-08 DIAGNOSIS — K449 Diaphragmatic hernia without obstruction or gangrene: Secondary | ICD-10-CM | POA: Diagnosis not present

## 2020-12-08 DIAGNOSIS — Z85850 Personal history of malignant neoplasm of thyroid: Secondary | ICD-10-CM

## 2020-12-08 DIAGNOSIS — R52 Pain, unspecified: Secondary | ICD-10-CM | POA: Diagnosis not present

## 2020-12-08 DIAGNOSIS — Z823 Family history of stroke: Secondary | ICD-10-CM | POA: Diagnosis not present

## 2020-12-08 DIAGNOSIS — N390 Urinary tract infection, site not specified: Secondary | ICD-10-CM | POA: Diagnosis present

## 2020-12-08 DIAGNOSIS — R079 Chest pain, unspecified: Secondary | ICD-10-CM | POA: Diagnosis not present

## 2020-12-08 DIAGNOSIS — R531 Weakness: Secondary | ICD-10-CM

## 2020-12-08 DIAGNOSIS — I517 Cardiomegaly: Secondary | ICD-10-CM | POA: Diagnosis not present

## 2020-12-08 DIAGNOSIS — Z90710 Acquired absence of both cervix and uterus: Secondary | ICD-10-CM | POA: Diagnosis not present

## 2020-12-08 DIAGNOSIS — Z7982 Long term (current) use of aspirin: Secondary | ICD-10-CM | POA: Diagnosis not present

## 2020-12-08 DIAGNOSIS — H269 Unspecified cataract: Secondary | ICD-10-CM | POA: Diagnosis present

## 2020-12-08 DIAGNOSIS — R54 Age-related physical debility: Secondary | ICD-10-CM | POA: Diagnosis present

## 2020-12-08 DIAGNOSIS — R0902 Hypoxemia: Secondary | ICD-10-CM | POA: Diagnosis not present

## 2020-12-08 DIAGNOSIS — J449 Chronic obstructive pulmonary disease, unspecified: Secondary | ICD-10-CM | POA: Diagnosis present

## 2020-12-08 DIAGNOSIS — J432 Centrilobular emphysema: Secondary | ICD-10-CM | POA: Diagnosis not present

## 2020-12-08 DIAGNOSIS — I493 Ventricular premature depolarization: Secondary | ICD-10-CM | POA: Diagnosis present

## 2020-12-08 DIAGNOSIS — Z79899 Other long term (current) drug therapy: Secondary | ICD-10-CM | POA: Diagnosis not present

## 2020-12-08 DIAGNOSIS — E785 Hyperlipidemia, unspecified: Secondary | ICD-10-CM | POA: Diagnosis present

## 2020-12-08 DIAGNOSIS — Z79890 Hormone replacement therapy (postmenopausal): Secondary | ICD-10-CM

## 2020-12-08 DIAGNOSIS — K219 Gastro-esophageal reflux disease without esophagitis: Secondary | ICD-10-CM | POA: Diagnosis present

## 2020-12-08 LAB — URINALYSIS, ROUTINE W REFLEX MICROSCOPIC
Bilirubin Urine: NEGATIVE
Glucose, UA: NEGATIVE mg/dL
Ketones, ur: NEGATIVE mg/dL
Nitrite: NEGATIVE
Protein, ur: NEGATIVE mg/dL
Specific Gravity, Urine: >1.03 — ABNORMAL HIGH (ref 1.005–1.030)
pH: 5 (ref 5.0–8.0)

## 2020-12-08 LAB — URINALYSIS, MICROSCOPIC (REFLEX)

## 2020-12-08 LAB — BASIC METABOLIC PANEL
Anion gap: 10 (ref 5–15)
BUN: 15 mg/dL (ref 8–23)
CO2: 22 mmol/L (ref 22–32)
Calcium: 9.7 mg/dL (ref 8.9–10.3)
Chloride: 106 mmol/L (ref 98–111)
Creatinine, Ser: 1.11 mg/dL — ABNORMAL HIGH (ref 0.44–1.00)
GFR, Estimated: 51 mL/min — ABNORMAL LOW (ref >60)
Glucose, Bld: 114 mg/dL — ABNORMAL HIGH (ref 70–99)
Potassium: 3.8 mmol/L (ref 3.5–5.1)
Sodium: 138 mmol/L (ref 135–145)

## 2020-12-08 LAB — CBC
HCT: 37.2 % (ref 36.0–46.0)
Hemoglobin: 12.5 g/dL (ref 12.0–15.0)
MCH: 30.9 pg (ref 26.0–34.0)
MCHC: 33.6 g/dL (ref 30.0–36.0)
MCV: 92.1 fL (ref 80.0–100.0)
Platelets: 223 10*3/uL (ref 150–400)
RBC: 4.04 MIL/uL (ref 3.87–5.11)
RDW: 13.2 % (ref 11.5–15.5)
WBC: 4 10*3/uL (ref 4.0–10.5)
nRBC: 0 % (ref 0.0–0.2)

## 2020-12-08 MED ORDER — SODIUM CHLORIDE 0.9 % IV BOLUS
1000.0000 mL | Freq: Once | INTRAVENOUS | Status: AC
  Administered 2020-12-09: 1000 mL via INTRAVENOUS

## 2020-12-08 NOTE — ED Triage Notes (Signed)
Pt arrives to ED with c/o generalized body aches x1 week. Pt states extreme fatigue and and low grades fevers. States last night had cold sweats. Denies CP, cough, congestion, and SOB. Denies dysuria.

## 2020-12-08 NOTE — ED Provider Notes (Addendum)
[facility] EMERGENCY DEPARTMENT Provider Note   CSN: [id number] Arrival date & time: 12/08/20  1202     History Chief Complaint  Patient presents with  . Generalized Body Aches    Phyllis Caldwell is a 79 y.o. female.  The history is provided by the patient and medical records.   79 y.o. F with hx of asthma, cataracts, COPD, depression, HTN, thyroid disease, presenting to the ED for generalized weakness, body aches and intermittent fevers.  Daughter reports she has been somewhat unwell for about 1 month but got acutely worse over the past 24 hours.  States she feels achy all over, feels like her skin is cracking, has had trouble getting around due to feeling weak and intermittently dizzy.  Daughter reports she did fall about 2 weeks ago due to weakness, no injuries from that.  She has had cough without production of sputum.  States she always feels SOB, somewhat worse here lately.  Denies urinary symptoms or hematuria.  She did not eat at all today and only 1/3 can of soup yesterday.  Barely drinking fluids at home.  States she just has no energy and cannot figure out what is wrong.  Generally ambulates with cane at home.  Past Medical History:  Diagnosis Date  . Asthma   . Cataract   . COPD (chronic obstructive pulmonary disease) (HCC)   . Coronary artery disease   . Depression   . Hypertension   . Poor dentition   . Thyroid disease   . Tobacco use     Patient Active Problem List   Diagnosis Date Noted  . Dysarthria 01/27/2019  . TIA (transient ischemic attack) 01/26/2019  . Cellulitis diffuse, face 09/09/2016  . Poor dentition 09/09/2016  . Thyroid disease   . Hypertension   . COPD (chronic obstructive pulmonary disease) (HCC) 12/30/2013  . Tobacco use 12/30/2013  . CAD in native artery 12/30/2013  . Old MI (myocardial infarction) 12/30/2013  . Essential hypertension 12/30/2013    Past Surgical History:  Procedure Laterality Date  . ABDOMINAL  HYSTERECTOMY    . LEFT HEART CATHETERIZATION WITH CORONARY ANGIOGRAM N/A 10/07/2012   Procedure: LEFT HEART CATHETERIZATION WITH CORONARY ANGIOGRAM;  Surgeon: Robynn Pane, MD;  Location: [facility] CATH LAB;  Service: Cardiovascular;  Laterality: N/A;  . THYROID SURGERY       OB History   No obstetric history on file.     Family History  Problem Relation Age of Onset  . Heart disease Mother   . Heart attack Mother   . Stroke Mother   . Heart attack Father   . Prostate cancer Father     Social History   Tobacco Use  . Smoking status: Former Smoker    Packs/day: 1.00    Types: Cigarettes    Quit date: 11/01/2017    Years since quitting: 3.1  . Smokeless tobacco: Never Used  Vaping Use  . Vaping Use: Never used  Substance Use Topics  . Alcohol use: No  . Drug use: No    Home Medications Prior to Admission medications   Medication Sig Start Date End Date Taking? Authorizing Provider  amLODipine (NORVASC) 5 MG tablet Take 1 tablet (5 mg total) by mouth daily. Reported on 01/05/2016 09/11/16   Clydia Llano, MD  aspirin EC 81 MG tablet Take 81 mg by mouth daily. Patient takes 2 daily    [provider]  cholecalciferol (VITAMIN D) 1000 units tablet Take 1,000 Units by  mouth daily.    [provider]  HYDROcodone-acetaminophen (NORCO/VICODIN) 5-325 MG tablet Take 1 tablet by mouth as needed. 03/10/20   [provider]  levothyroxine (SYNTHROID, LEVOTHROID) 50 MCG tablet Take 1 tablet (50 mcg total) by mouth daily before breakfast. Reported on 01/05/2016 09/11/16   Clydia Llano, MD  pantoprazole (PROTONIX) 40 MG tablet Take 1 tablet (40 mg total) by mouth daily at 12 noon. 09/11/16   Clydia Llano, MD  telmisartan (MICARDIS) 20 MG tablet Take 20 mg by mouth daily. 01/23/19   [provider]  vitamin B-12 (CYANOCOBALAMIN) 500 MCG tablet Take 500 mcg by mouth daily.    [provider]    Allergies    Bee venom, Coconut fatty acids, Codeine,  Penicillins, and Sulfa antibiotics  Review of Systems   Review of Systems  Constitutional: Positive for fatigue.  Neurological: Positive for weakness.  All other systems reviewed and are negative.   Physical Exam Updated Vital Signs BP 133/85 (BP Location: Right Arm)   Pulse 77   Temp 99 F (37.2 C) (Oral)   Resp 16   SpO2 95%   Physical Exam Vitals and nursing note reviewed.  Constitutional:      Appearance: She is well-developed and well-nourished.     Comments: Elderly, appears tired  HENT:     Head: Normocephalic and atraumatic.     Mouth/Throat:     Mouth: Oropharynx is clear and moist.  Eyes:     Extraocular Movements: EOM normal.     Conjunctiva/sclera: Conjunctivae normal.     Pupils: Pupils are equal, round, and reactive to light.  Cardiovascular:     Rate and Rhythm: Normal rate and regular rhythm.     Heart sounds: Normal heart sounds.  Pulmonary:     Effort: Pulmonary effort is normal. No respiratory distress.     Breath sounds: Normal breath sounds. No rhonchi.     Comments: Dry cough, lungs grossly clear, does seem a bit winded during conversation, O2 sats stable on RA Abdominal:     General: Bowel sounds are normal.     Palpations: Abdomen is soft.     Tenderness: There is no abdominal tenderness. There is no rebound.  Musculoskeletal:        General: Normal range of motion.     Cervical back: Normal range of motion.  Skin:    General: Skin is warm and dry.  Neurological:     Mental Status: She is alert and oriented to person, place, and time.  Psychiatric:        Mood and Affect: Mood and affect normal.     ED Results / Procedures / Treatments   Labs (all labs ordered are listed, but only abnormal results are displayed) Labs Reviewed  RESP PANEL BY RT-PCR (FLU A&B, COVID) ARPGX2 - Abnormal; Notable for the following components:      Result Value   SARS Coronavirus 2 by RT PCR POSITIVE (*)    All other components within normal limits  BASIC  METABOLIC PANEL - Abnormal; Notable for the following components:   Glucose, Bld 114 (*)    Creatinine, Ser 1.11 (*)    GFR, Estimated 51 (*)    All other components within normal limits  URINALYSIS, ROUTINE W REFLEX MICROSCOPIC - Abnormal; Notable for the following components:   APPearance CLOUDY (*)    Specific Gravity, Urine >1.030 (*)    Hgb urine dipstick SMALL (*)    Leukocytes,Ua MODERATE (*)  All other components within normal limits  URINALYSIS, MICROSCOPIC (REFLEX) - Abnormal; Notable for the following components:   Bacteria, UA FEW (*)    All other components within normal limits  TSH - Abnormal; Notable for the following components:   TSH 0.262 (*)    All other components within normal limits  URINE CULTURE  CULTURE, BLOOD (ROUTINE X 2)  CULTURE, BLOOD (ROUTINE X 2)  CBC  LACTIC ACID, PLASMA  CBG MONITORING, ED    EKG EKG Interpretation  Date/Time:  Wednesday December 08 2020 18:49:08 EST Ventricular Rate:  81 PR Interval:  138 QRS Duration: 88 QT Interval:  376 QTC Calculation: 436 R Axis:   7 Text Interpretation: Sinus rhythm with Premature supraventricular complexes Nonspecific ST and T wave abnormality Abnormal ECG No STEMI Confirmed by Alvester Chou [id number]) on 12/08/2020 9:53:23 PM   Radiology DG Chest Port 1 View  Result Date: 12/08/2020 CLINICAL DATA:  Cough, shortness of breath, weakness. Generalized body aches and fatigue. EXAM: PORTABLE CHEST 1 VIEW COMPARISON:  None. FINDINGS: Upper normal heart size. Aortic tortuosity. Minimal vague bibasilar opacities. No pulmonary edema, pleural effusion or pneumothorax. No acute osseous abnormalities are seen. IMPRESSION: Minimal vague bibasilar opacities, atelectasis versus pneumonia. Electronically Signed   By: Narda Rutherford M.D.   On: 12/08/2020 23:04    Procedures Procedures (including critical care time)  Medications Ordered in ED Medications  dexamethasone (DECADRON) injection 10 mg (has no  administration in time range)  remdesivir 200 mg in sodium chloride 0.9% 250 mL IVPB (has no administration in time range)    Followed by  remdesivir 100 mg in sodium chloride 0.9 % 100 mL IVPB (has no administration in time range)  sodium chloride 0.9 % bolus 1,000 mL (1,000 mLs Intravenous New Bag/Given 12/09/20 0110)    ED Course  I have reviewed the triage vital signs and the nursing notes.  Pertinent labs & imaging results that were available during my care of the patient were reviewed by me and considered in my medical decision making (see chart for details).    MDM Rules/Calculators/A&P  79 y.o. F here with generalized weakness for the past month, worse over the past 24 hours.  Has been around daughter who has a "cold".  Low grade fever here, appears weak but non-toxic.  She has chronic SOB but states worse now.  She does sound winded during conversation.  Also appears clinically dry, per daugther minimal intake over the past 24 hours.  Initials labs from triage reassuring.  UA appears contaminated.  Will add TSH given hx thryoid disease, CXR, lactate, blood cultures, covid screen.  3:21 AM covid screen positive.  Does have bibasilar infiltrates noted on CXR.  Patient has had increased RR here in ED, O2 sats currently are marginal at 92-93% on re-check.  Feel she will require admission.  Results discuss with patient and daughter, they are agreeable to admission.  She has not been vaccinated against covid-19.  Discussed with hospitalist, Dr. Loney Loh-- will admit for ongoing care.  Final Clinical Impression(s) / ED Diagnoses Final diagnoses:  COVID-19  Generalized weakness    Rx / DC Orders ED Discharge Orders    None       Garlon Hatchet, PA-C 12/09/20 0355    Garlon Hatchet, PA-C 12/09/20 0355    Maia Plan, MD 12/14/20 1144

## 2020-12-08 NOTE — ED Notes (Signed)
Pt's daughter to NF upset regarding wait times, attempts made to ensure we are using all available space to see patients and we will get her mother to a room asap. Family upset we have not provided food/beverage/meds, assured family if patient is nauseated she should not be eating/drinking, VSS.  Family not satisfied with my explanations stating she can see into the main care area and we are not using hall areas.  Enis Gash. CN notified pt and family would like to speak with her.

## 2020-12-09 ENCOUNTER — Inpatient Hospital Stay (HOSPITAL_COMMUNITY): Payer: Medicare HMO

## 2020-12-09 DIAGNOSIS — Z8249 Family history of ischemic heart disease and other diseases of the circulatory system: Secondary | ICD-10-CM | POA: Diagnosis not present

## 2020-12-09 DIAGNOSIS — Z79890 Hormone replacement therapy (postmenopausal): Secondary | ICD-10-CM | POA: Diagnosis not present

## 2020-12-09 DIAGNOSIS — N289 Disorder of kidney and ureter, unspecified: Secondary | ICD-10-CM

## 2020-12-09 DIAGNOSIS — I10 Essential (primary) hypertension: Secondary | ICD-10-CM

## 2020-12-09 DIAGNOSIS — Z8673 Personal history of transient ischemic attack (TIA), and cerebral infarction without residual deficits: Secondary | ICD-10-CM | POA: Diagnosis not present

## 2020-12-09 DIAGNOSIS — I2510 Atherosclerotic heart disease of native coronary artery without angina pectoris: Secondary | ICD-10-CM | POA: Diagnosis present

## 2020-12-09 DIAGNOSIS — Z90710 Acquired absence of both cervix and uterus: Secondary | ICD-10-CM | POA: Diagnosis not present

## 2020-12-09 DIAGNOSIS — N390 Urinary tract infection, site not specified: Secondary | ICD-10-CM | POA: Diagnosis present

## 2020-12-09 DIAGNOSIS — J9811 Atelectasis: Secondary | ICD-10-CM | POA: Diagnosis present

## 2020-12-09 DIAGNOSIS — E039 Hypothyroidism, unspecified: Secondary | ICD-10-CM

## 2020-12-09 DIAGNOSIS — J432 Centrilobular emphysema: Secondary | ICD-10-CM | POA: Diagnosis not present

## 2020-12-09 DIAGNOSIS — K449 Diaphragmatic hernia without obstruction or gangrene: Secondary | ICD-10-CM | POA: Diagnosis not present

## 2020-12-09 DIAGNOSIS — Z8042 Family history of malignant neoplasm of prostate: Secondary | ICD-10-CM | POA: Diagnosis not present

## 2020-12-09 DIAGNOSIS — Z885 Allergy status to narcotic agent status: Secondary | ICD-10-CM | POA: Diagnosis not present

## 2020-12-09 DIAGNOSIS — J1282 Pneumonia due to coronavirus disease 2019: Secondary | ICD-10-CM | POA: Diagnosis present

## 2020-12-09 DIAGNOSIS — Z87891 Personal history of nicotine dependence: Secondary | ICD-10-CM | POA: Diagnosis not present

## 2020-12-09 DIAGNOSIS — Z85850 Personal history of malignant neoplasm of thyroid: Secondary | ICD-10-CM | POA: Diagnosis not present

## 2020-12-09 DIAGNOSIS — Z9861 Coronary angioplasty status: Secondary | ICD-10-CM | POA: Diagnosis not present

## 2020-12-09 DIAGNOSIS — Z7982 Long term (current) use of aspirin: Secondary | ICD-10-CM | POA: Diagnosis not present

## 2020-12-09 DIAGNOSIS — U071 COVID-19: Secondary | ICD-10-CM | POA: Diagnosis present

## 2020-12-09 DIAGNOSIS — R079 Chest pain, unspecified: Secondary | ICD-10-CM | POA: Diagnosis not present

## 2020-12-09 DIAGNOSIS — Z823 Family history of stroke: Secondary | ICD-10-CM | POA: Diagnosis not present

## 2020-12-09 DIAGNOSIS — J9601 Acute respiratory failure with hypoxia: Secondary | ICD-10-CM | POA: Diagnosis present

## 2020-12-09 DIAGNOSIS — J440 Chronic obstructive pulmonary disease with acute lower respiratory infection: Secondary | ICD-10-CM | POA: Diagnosis present

## 2020-12-09 DIAGNOSIS — Z880 Allergy status to penicillin: Secondary | ICD-10-CM | POA: Diagnosis not present

## 2020-12-09 DIAGNOSIS — I252 Old myocardial infarction: Secondary | ICD-10-CM | POA: Diagnosis not present

## 2020-12-09 DIAGNOSIS — R531 Weakness: Secondary | ICD-10-CM | POA: Diagnosis present

## 2020-12-09 DIAGNOSIS — I517 Cardiomegaly: Secondary | ICD-10-CM | POA: Diagnosis not present

## 2020-12-09 DIAGNOSIS — Z882 Allergy status to sulfonamides status: Secondary | ICD-10-CM | POA: Diagnosis not present

## 2020-12-09 DIAGNOSIS — Z79899 Other long term (current) drug therapy: Secondary | ICD-10-CM | POA: Diagnosis not present

## 2020-12-09 LAB — FIBRINOGEN: Fibrinogen: 380 mg/dL (ref 210–475)

## 2020-12-09 LAB — TROPONIN I (HIGH SENSITIVITY)
Troponin I (High Sensitivity): 10 ng/L (ref <18)
Troponin I (High Sensitivity): 8 ng/L (ref <18)

## 2020-12-09 LAB — BRAIN NATRIURETIC PEPTIDE: B Natriuretic Peptide: 73.4 pg/mL (ref 0.0–100.0)

## 2020-12-09 LAB — D-DIMER, QUANTITATIVE: D-Dimer, Quant: 1.37 ug/mL-FEU — ABNORMAL HIGH (ref 0.00–0.50)

## 2020-12-09 LAB — FERRITIN: Ferritin: 222 ng/mL (ref 11–307)

## 2020-12-09 LAB — HEPATIC FUNCTION PANEL
ALT: 14 U/L (ref 0–44)
AST: 20 U/L (ref 15–41)
Albumin: 3.7 g/dL (ref 3.5–5.0)
Alkaline Phosphatase: 88 U/L (ref 38–126)
Bilirubin, Direct: 0.2 mg/dL (ref 0.0–0.2)
Indirect Bilirubin: 0.5 mg/dL (ref 0.3–0.9)
Total Bilirubin: 0.7 mg/dL (ref 0.3–1.2)
Total Protein: 6.7 g/dL (ref 6.5–8.1)

## 2020-12-09 LAB — TYPE AND SCREEN
ABO/RH(D): A NEG
Antibody Screen: NEGATIVE

## 2020-12-09 LAB — LACTIC ACID, PLASMA: Lactic Acid, Venous: 1.4 mmol/L (ref 0.5–1.9)

## 2020-12-09 LAB — ABO/RH: ABO/RH(D): A NEG

## 2020-12-09 LAB — LACTATE DEHYDROGENASE: LDH: 145 U/L (ref 98–192)

## 2020-12-09 LAB — PROCALCITONIN: Procalcitonin: <0.1 ng/mL

## 2020-12-09 LAB — C-REACTIVE PROTEIN: CRP: 0.6 mg/dL (ref <1.0)

## 2020-12-09 LAB — RESP PANEL BY RT-PCR (FLU A&B, COVID) ARPGX2
Influenza A by PCR: NEGATIVE
Influenza B by PCR: NEGATIVE
SARS Coronavirus 2 by RT PCR: POSITIVE — AB

## 2020-12-09 LAB — HEPATITIS B SURFACE ANTIGEN: Hepatitis B Surface Ag: NONREACTIVE

## 2020-12-09 LAB — TSH: TSH: 0.262 u[IU]/mL — ABNORMAL LOW (ref 0.350–4.500)

## 2020-12-09 MED ORDER — ONDANSETRON HCL 4 MG/2ML IJ SOLN: 4.0000 mg | Freq: Four times a day (QID) | INTRAMUSCULAR | Status: DC | PRN

## 2020-12-09 MED ORDER — ZINC SULFATE 220 (50 ZN) MG PO CAPS
220.0000 mg | ORAL_CAPSULE | Freq: Every day | ORAL | Status: DC
  Administered 2020-12-09 – 2020-12-11 (×3): 220 mg via ORAL
  Filled 2020-12-09 (×3): qty 1

## 2020-12-09 MED ORDER — ASCORBIC ACID 500 MG PO TABS
500.0000 mg | ORAL_TABLET | Freq: Every day | ORAL | Status: DC
  Administered 2020-12-09 – 2020-12-11 (×3): 500 mg via ORAL
  Filled 2020-12-09 (×3): qty 1

## 2020-12-09 MED ORDER — LEVOTHYROXINE SODIUM 50 MCG PO TABS
50.0000 ug | ORAL_TABLET | Freq: Every day | ORAL | Status: DC
  Administered 2020-12-10 – 2020-12-11 (×2): 50 ug via ORAL
  Filled 2020-12-09 (×3): qty 1

## 2020-12-09 MED ORDER — ENOXAPARIN SODIUM 40 MG/0.4ML ~~LOC~~ SOLN
40.0000 mg | SUBCUTANEOUS | Status: DC
  Administered 2020-12-09 – 2020-12-11 (×3): 40 mg via SUBCUTANEOUS
  Filled 2020-12-09 (×3): qty 0.4

## 2020-12-09 MED ORDER — SODIUM CHLORIDE 0.9 % IV SOLN
200.0000 mg | Freq: Once | INTRAVENOUS | Status: AC
  Administered 2020-12-09: 200 mg via INTRAVENOUS
  Filled 2020-12-09: qty 40

## 2020-12-09 MED ORDER — SODIUM CHLORIDE 0.9% FLUSH
3.0000 mL | Freq: Two times a day (BID) | INTRAVENOUS | Status: DC
  Administered 2020-12-09 – 2020-12-11 (×5): 3 mL via INTRAVENOUS

## 2020-12-09 MED ORDER — SODIUM CHLORIDE 0.9 % IV SOLN: Freq: Once | INTRAVENOUS | Status: AC

## 2020-12-09 MED ORDER — IOHEXOL 350 MG/ML SOLN
100.0000 mL | Freq: Once | INTRAVENOUS | Status: AC | PRN
  Administered 2020-12-09: 65 mL via INTRAVENOUS

## 2020-12-09 MED ORDER — SODIUM CHLORIDE 0.9 % IV SOLN
1.0000 g | INTRAVENOUS | Status: AC
  Administered 2020-12-09 – 2020-12-11 (×3): 1 g via INTRAVENOUS
  Filled 2020-12-09 (×3): qty 10

## 2020-12-09 MED ORDER — HYDROCOD POLST-CPM POLST ER 10-8 MG/5ML PO SUER: 5.0000 mL | Freq: Two times a day (BID) | ORAL | Status: DC | PRN

## 2020-12-09 MED ORDER — ACETAMINOPHEN 325 MG PO TABS
650.0000 mg | ORAL_TABLET | Freq: Four times a day (QID) | ORAL | Status: DC | PRN
  Administered 2020-12-09: 650 mg via ORAL
  Filled 2020-12-09: qty 2

## 2020-12-09 MED ORDER — HYDROCODONE-ACETAMINOPHEN 5-325 MG PO TABS: 1.0000 | ORAL_TABLET | Freq: Four times a day (QID) | ORAL | Status: DC | PRN

## 2020-12-09 MED ORDER — ROSUVASTATIN CALCIUM 5 MG PO TABS
10.0000 mg | ORAL_TABLET | ORAL | Status: DC
  Administered 2020-12-10: 10 mg via ORAL
  Filled 2020-12-09 (×2): qty 2

## 2020-12-09 MED ORDER — PREDNISONE 5 MG PO TABS: 50.0000 mg | ORAL_TABLET | Freq: Every day | ORAL | Status: DC

## 2020-12-09 MED ORDER — SODIUM CHLORIDE 0.9 % IV SOLN
100.0000 mg | Freq: Every day | INTRAVENOUS | Status: DC
  Administered 2020-12-10 – 2020-12-11 (×2): 100 mg via INTRAVENOUS
  Filled 2020-12-09 (×3): qty 20

## 2020-12-09 MED ORDER — PANTOPRAZOLE SODIUM 40 MG PO TBEC
40.0000 mg | DELAYED_RELEASE_TABLET | Freq: Every day | ORAL | Status: DC
  Administered 2020-12-09 – 2020-12-11 (×3): 40 mg via ORAL
  Filled 2020-12-09 (×3): qty 1

## 2020-12-09 MED ORDER — GUAIFENESIN-DM 100-10 MG/5ML PO SYRP: 10.0000 mL | ORAL_SOLUTION | ORAL | Status: DC | PRN

## 2020-12-09 MED ORDER — METHYLPREDNISOLONE SODIUM SUCC 40 MG IJ SOLR
40.0000 mg | Freq: Two times a day (BID) | INTRAMUSCULAR | Status: DC
  Administered 2020-12-09 – 2020-12-11 (×4): 40 mg via INTRAVENOUS
  Filled 2020-12-09 (×4): qty 1

## 2020-12-09 MED ORDER — IPRATROPIUM-ALBUTEROL 20-100 MCG/ACT IN AERS
1.0000 | INHALATION_SPRAY | Freq: Four times a day (QID) | RESPIRATORY_TRACT | Status: DC
  Administered 2020-12-09 – 2020-12-10 (×7): 1 via RESPIRATORY_TRACT
  Filled 2020-12-09: qty 4

## 2020-12-09 MED ORDER — DEXAMETHASONE SODIUM PHOSPHATE 10 MG/ML IJ SOLN
10.0000 mg | Freq: Once | INTRAMUSCULAR | Status: AC
  Administered 2020-12-09: 10 mg via INTRAVENOUS
  Filled 2020-12-09: qty 1

## 2020-12-09 MED ORDER — ACETAMINOPHEN 500 MG PO TABS
1000.0000 mg | ORAL_TABLET | Freq: Once | ORAL | Status: AC
  Administered 2020-12-09: 1000 mg via ORAL
  Filled 2020-12-09: qty 2

## 2020-12-09 MED ORDER — ONDANSETRON HCL 4 MG PO TABS: 4.0000 mg | ORAL_TABLET | Freq: Four times a day (QID) | ORAL | Status: DC | PRN

## 2020-12-09 NOTE — H&P (Addendum)
History and Physical    Phyllis Caldwell SHF:[id number] DOB: 05/07/1941 DOA: 12/08/2020  Referring MD/NP/PA: John Giovanni, MD PCP: Mila Palmer, MD  Patient coming from: Home  Chief Complaint: Not feeling well  I have personally briefly reviewed patient's old medical records in [facility]   HPI: Phyllis Caldwell is a 79 y.o. female with medical history significant of hypertension, CAD, COPD, hypothyroidism, vertigo, and prior tobacco abuse presents with complaints of not feeling well for at least the last 1 to 2 weeks.  Complains of having no energy, nonproductive cough that is worse than baseline, generalized myalgias, headache, nausea, substernal chest pain, intermittent fevers, and no appetite.  She had not been eating very much at home.  Patient states that with any kind of activity she has been feeling more short of breath than she usually does.  Admits to prior history of smoking tobacco up until about 2-3 years ago.  Ms. Huot did not receive any of the COVID-19 vaccines.  She sometimes has issues with vertigo and walks with a cane at baseline.  Denies any vomiting, recent falls, dysuria , or urinary frequency symptoms, but feels like she could have a urinary tract infection or did have a urinary tract infection.  ED Course: Upon admission into the emergency department patient was seen to be afebrile with respirations 16-27, and O2 saturations 91-96% on room air. Labs including CBC and BMP significant for creatinine of 1.11(baseline previously around 1).  Chest x-ray showed minimal vague bibasilar opacities concerning for atelectasis versus pneumonia.  Patient had been given 1 L normal saline IV fluids, Tylenol 1000 mg, Decadron 10 mg, and remdesivir.  TRH called to admit.  Review of Systems  Constitutional: Positive for fever and malaise/fatigue. Negative for diaphoresis.  HENT: Negative for congestion and nosebleeds.   Eyes: Negative for photophobia and pain.  Respiratory:  Positive for cough and shortness of breath. Negative for sputum production and wheezing.   Cardiovascular: Positive for chest pain. Negative for leg swelling.  Gastrointestinal: Positive for diarrhea and nausea. Negative for abdominal pain and vomiting.  Genitourinary: Negative for dysuria and hematuria.  Musculoskeletal: Positive for joint pain and myalgias.  Skin: Negative for itching.  Neurological: Positive for weakness and headaches.  Psychiatric/Behavioral: Negative for memory loss and substance abuse.    Past Medical History:  Diagnosis Date  . Asthma   . Cataract   . COPD (chronic obstructive pulmonary disease) (HCC)   . Coronary artery disease   . Depression   . Hypertension   . Poor dentition   . Thyroid disease   . Tobacco use     Past Surgical History:  Procedure Laterality Date  . ABDOMINAL HYSTERECTOMY    . LEFT HEART CATHETERIZATION WITH CORONARY ANGIOGRAM N/A 10/07/2012   Procedure: LEFT HEART CATHETERIZATION WITH CORONARY ANGIOGRAM;  Surgeon: Robynn Pane, MD;  Location[facility] C[facility] LAB;  Service: Cardiovascular;  Laterality: N/A;  . THYROID SURGERY       reports that she quit smoking about 3 years ago. Her smoking use included cigarettes. She smoked 1.00 pack per day. She has never used smokeless tobacco. She reports that she does not drink alcohol and does not use drugs.  Allergies  Allergen Reactions  . Bee Venom Hives  . Coconut Fatty Acids     unknown  . Codeine Nausea And Vomiting  . Penicillins     Patient doesn't remember  . Sulfa Antibiotics     unknown    Family History  Problem Relation Age of Onset  . Heart disease Mother   . Heart attack Mother   . Stroke Mother   . Heart attack Father   . Prostate cancer Father     Prior to Admission medications   Medication Sig Start Date End Date Taking? Authorizing Provider  amLODipine (NORVASC) 5 MG tablet Take 1 tablet (5 mg total) by mouth daily. Reported on 01/05/2016 09/11/16   Clydia Llano,  MD  aspirin EC 81 MG tablet Take 81 mg by mouth daily. Patient takes 2 daily    [provider]  cholecalciferol (VITAMIN D) 1000 units tablet Take 1,000 Units by mouth daily.    [provider]  HYDROcodone-acetaminophen (NORCO/VICODIN) 5-325 MG tablet Take 1 tablet by mouth as needed. 03/10/20   [provider]  levothyroxine (SYNTHROID, LEVOTHROID) 50 MCG tablet Take 1 tablet (50 mcg total) by mouth daily before breakfast. Reported on 01/05/2016 09/11/16   Clydia Llano, MD  pantoprazole (PROTONIX) 40 MG tablet Take 1 tablet (40 mg total) by mouth daily at 12 noon. 09/11/16   Clydia Llano, MD  telmisartan (MICARDIS) 20 MG tablet Take 20 mg by mouth daily. 01/23/19   [provider]  vitamin B-12 (CYANOCOBALAMIN) 500 MCG tablet Take 500 mcg by mouth daily.    [provider]    Physical Exam:  Constitutional: Frail elderly female who appears to be fatigued Vitals:   12/09/20 0530 12/09/20 0545 12/09/20 0600 12/09/20 0615  BP: 137/80 123/68 133/69 115/71  Pulse: 61 67 66 69  Resp: (!) 22 (!) 23 (!) 23 (!) 24  Temp:      TempSrc:      SpO2: 92%   91%   Eyes: PERRL, lids and conjunctivae normal ENMT: Mucous membranes are dry. Posterior pharynx clear of any exudate or lesions.  Neck: normal, supple, no masses, no thyromegaly Respiratory: Tachypneic talking in shortened sentences during evaluation.  Lung sounds slightly decreased, but otherwise clear.  Maintaining O2 saturations on room air. Cardiovascular: Regular rate and rhythm, no murmurs / rubs / gallops. No extremity edema. 2+ pedal pulses. No carotid bruits.  Abdomen: no tenderness, no masses palpated. No hepatosplenomegaly. Bowel sounds positive.  Musculoskeletal: Scoliosis noted of the spine.  No significant muscle weakness appreciated Skin: no rashes, lesions, ulcers. No induration Neurologic: CN 2-12 grossly intact. Sensation intact, DTR normal. Strength 5/5 in all 4.  Psychiatric:  Normal judgment and insight. Alert and oriented x 3. Normal mood.     Labs on Admission: I have personally reviewed following labs and imaging studies  CBC: Recent Labs  Lab 12/08/20 1230  WBC 4.0  HGB 12.5  HCT 37.2  MCV 92.1  PLT 223   Basic Metabolic Panel: Recent Labs  Lab 12/08/20 1230  NA 138  K 3.8  CL 106  CO2 22  GLUCOSE 114*  BUN 15  CREATININE 1.11*  CALCIUM 9.7   GFR: CrCl cannot be calculated (Unknown ideal weight.). Liver Function Tests: No results for input(s): AST, ALT, ALKPHOS, BILITOT, PROT, ALBUMIN in the last 168 hours. No results for input(s): LIPASE, AMYLASE in the last 168 hours. No results for input(s): AMMONIA in the last 168 hours. Coagulation Profile: No results for input(s): INR, PROTIME in the last 168 hours. Cardiac Enzymes: No results for input(s): CKTOTAL, CKMB, CKMBINDEX, TROPONINI in the last 168 hours. BNP (last 3 results) Recent Labs    03/18/20 1558  PROBNP 271   HbA1C: No results for input(s): HGBA1C in the last 72  hours. CBG: No results for input(s): GLUCAP in the last 168 hours. Lipid Profile: No results for input(s): CHOL, HDL, LDLCALC, TRIG, CHOLHDL, LDLDIRECT in the last 72 hours. Thyroid Function Tests: Recent Labs    12/09/20 0049  TSH 0.262*   Anemia Panel: No results for input(s): VITAMINB12, FOLATE, FERRITIN, TIBC, IRON, RETICCTPCT in the last 72 hours. Urine analysis:    Component Value Date/Time   COLORURINE YELLOW 12/08/2020 1954   APPEARANCEUR CLOUDY (A) 12/08/2020 1954   LABSPEC >1.030 (H) 12/08/2020 1954   PHURINE 5.0 12/08/2020 1954   GLUCOSEU NEGATIVE 12/08/2020 1954   HGBUR SMALL (A) 12/08/2020 1954   BILIRUBINUR NEGATIVE 12/08/2020 1954   KETONESUR NEGATIVE 12/08/2020 1954   PROTEINUR NEGATIVE 12/08/2020 1954   NITRITE NEGATIVE 12/08/2020 1954   LEUKOCYTESUR MODERATE (A) 12/08/2020 1954   Sepsis Labs: Recent Results (from the past 240 hour(s))  Resp Panel by RT-PCR (Flu A&B, Covid)  Nasopharyngeal Swab     Status: Abnormal   Collection Time: 12/09/20  1:05 AM   Specimen: Nasopharyngeal Swab; Nasopharyngeal(NP) swabs in vial transport medium  Result Value Ref Range Status   SARS Coronavirus 2 by RT PCR POSITIVE (A) NEGATIVE Final    Comment: RESULT CALLED TO, READ BACK BY AND VERIFIED WITH: S WIRTZ RN 12/09/20 0314 JDW (NOTE) SARS-CoV-2 target nucleic acids are DETECTED.  The SARS-CoV-2 RNA is generally detectable in upper respiratory specimens during the acute phase of infection. Positive results are indicative of the presence of the identified virus, but do not rule out bacterial infection or co-infection with other pathogens not detected by the test. Clinical correlation with patient history and other diagnostic information is necessary to determine patient infection status. The expected result is Negative.  Fact Sheet for Patients: BloggerCourse.com  Fact Sheet for Healthcare Providers: SeriousBroker.it  This test is not yet approved or cleared by the [place] FDA and  has been authorized for detection and/or diagnosis of SARS-CoV-2 by FDA under an Emergency Use Authorization (EUA).  This EUA will remain in effect (meaning this test can be used ) for the duration of  the COVID-19 declaration under Section 564(b)(1) of the Act, 21 U.S.C. section 360bbb-3(b)(1), unless the authorization is terminated or revoked sooner.     Influenza A by PCR NEGATIVE NEGATIVE Final   Influenza B by PCR NEGATIVE NEGATIVE Final    Comment: (NOTE) The Xpert Xpress SARS-CoV-2/FLU/RSV plus assay is intended as an aid in the diagnosis of influenza from Nasopharyngeal swab specimens and should not be used as a sole basis for treatment. Nasal washings and aspirates are unacceptable for Xpert Xpress SARS-CoV-2/FLU/RSV testing.  Fact Sheet for Patients: BloggerCourse.com  Fact Sheet for Healthcare  Providers: SeriousBroker.it  This test is not yet approved or cleared by the [place] FDA and has been authorized for detection and/or diagnosis of SARS-CoV-2 by FDA under an Emergency Use Authorization (EUA). This EUA will remain in effect (meaning this test can be used) for the duration of the COVID-19 declaration under Section 564(b)(1) of the Act, 21 U.S.C. section 360bbb-3(b)(1), unless the authorization is terminated or revoked.  Performed at [facility], [address], [place], [place] [address]      Radiological Exams on Admission: DG Chest Port 1 View  Result Date: 12/08/2020 CLINICAL DATA:  Cough, shortness of breath, weakness. Generalized body aches and fatigue. EXAM: PORTABLE CHEST 1 VIEW COMPARISON:  None. FINDINGS: Upper normal heart size. Aortic tortuosity. Minimal vague bibasilar opacities. No pulmonary edema, pleural effusion  or pneumothorax. No acute osseous abnormalities are seen. IMPRESSION: Minimal vague bibasilar opacities, atelectasis versus pneumonia. Electronically Signed   By: Narda RutherfordMelanie  Sanford M.D.   On: 12/08/2020 23:04    EKG: Independently reviewed.  Sinus rhythm at 81 bpm with PVC  Assessment/Plan Pneumonia due to COVID-19 virus: Acute.  Patient presents with complaints of body aches and chills.  Chest x-ray showing vague bibasilar opacities suspected likely pneumonia given COVID-19 positive status.  Currently O2 saturations only dropped as low as 90% with ambulation on room air. -Admit to medical telemetry bed -Continuous pulse oximetry with nasal cannula oxygen to maintain O2 saturation greater than 90%. -Check blood cultures -Check inflammatory markers and monitor daily.  -D-dimer elevated at 1.37 and will check a CT angiogram of the chest given patient's complaints of substernal chest pain -Encourage self proning  -Combivent inhalers  -Remdesivir per pharmacy -Empiric antibiotics of Rocephin -Vitamin C and  zinc -Antitussives as needed -Tylenol as needed for fever -Check O2 sats with ambulation tomorrow morning  Chest pain: Patient reports substernal chest pain.  Pain not reproducible on physical exam.  EKG without significant ischemic changes.  High-sensitivity troponin 10.  Normal stress test in 2016.  D-dimer was noted to be elevated. -Follow-up repeat troponin -Follow-up CT angiogram of the chest  COPD, without exacerbation: Patient without acute wheezing noted on physical exam.  Prior history of smoking, but quit approximately 3 years ago. -Continue Combivent inhaler  Possible urinary tract infection: Acute.  Patient reports that she feels like she may have a urinary tract infection, but denies any dysuria or urinary frequency symptoms.  Urinalysis was abnormal with high specific gravity, moderate leukocytes, few bacteria, and 11-20 squamous epithelial cells.  Would suspect this could possibly have been a contamination. -Follow-up urine culture -Continue antibiotics as seen above  Renal insufficiency: Acute.  Creatinine just mildly elevated at 1.1 with BUN 15.  Baseline creatinine previously around 1.  Urinalysis did show elevated specific gravity to suggest aspect of dehydration. -Gentle IV fluids at 75 mL/h x 1 L -Hold nephrotoxic agents  Essential hypertension: Blood pressures currently lower end of normal at 112/77.  Home blood pressure medications include amlodipine 5 mg daily and telmisartan 20 mg daily. -Consider restarting home blood pressure medications in a.m. if blood pressures improve  Hypothyroidism: TSH noted to be 0.262. -Continue levothyroxine  GERD -Continue home proton  DVT prophylaxis: lovenox  Code Status: Full  Family Communication: After 3 attempts left voicemail. Disposition Plan: To be determined Consults called: None Admission status: Inpatient, requiring more than 2 midnight stay due to frailty with history of COPD and COVID-19.  Clydie Braunondell A Kain Milosevic  MD [facility]   If 7PM-7AM, please contact night-coverage   12/09/2020, 7:56 AM

## 2020-12-09 NOTE — ED Notes (Signed)
Pt refusing to have rectal temp taken

## 2020-12-10 DIAGNOSIS — R079 Chest pain, unspecified: Secondary | ICD-10-CM | POA: Diagnosis not present

## 2020-12-10 DIAGNOSIS — R531 Weakness: Secondary | ICD-10-CM

## 2020-12-10 DIAGNOSIS — U071 COVID-19: Secondary | ICD-10-CM | POA: Diagnosis not present

## 2020-12-10 DIAGNOSIS — E039 Hypothyroidism, unspecified: Secondary | ICD-10-CM | POA: Diagnosis not present

## 2020-12-10 LAB — CBC WITH DIFFERENTIAL/PLATELET
Abs Immature Granulocytes: 0.02 10*3/uL (ref 0.00–0.07)
Basophils Absolute: 0 10*3/uL (ref 0.0–0.1)
Basophils Relative: 0 %
Eosinophils Absolute: 0 10*3/uL (ref 0.0–0.5)
Eosinophils Relative: 0 %
HCT: 36.5 % (ref 36.0–46.0)
Hemoglobin: 12.1 g/dL (ref 12.0–15.0)
Immature Granulocytes: 1 %
Lymphocytes Relative: 15 %
Lymphs Abs: 0.7 10*3/uL (ref 0.7–4.0)
MCH: 30.6 pg (ref 26.0–34.0)
MCHC: 33.2 g/dL (ref 30.0–36.0)
MCV: 92.4 fL (ref 80.0–100.0)
Monocytes Absolute: 0.2 10*3/uL (ref 0.1–1.0)
Monocytes Relative: 3 %
Neutro Abs: 3.6 10*3/uL (ref 1.7–7.7)
Neutrophils Relative %: 81 %
Platelets: 169 10*3/uL (ref 150–400)
RBC: 3.95 MIL/uL (ref 3.87–5.11)
RDW: 13.5 % (ref 11.5–15.5)
WBC: 4.4 10*3/uL (ref 4.0–10.5)
nRBC: 0 % (ref 0.0–0.2)

## 2020-12-10 LAB — MAGNESIUM: Magnesium: 2 mg/dL (ref 1.7–2.4)

## 2020-12-10 LAB — COMPREHENSIVE METABOLIC PANEL
ALT: 12 U/L (ref 0–44)
AST: 21 U/L (ref 15–41)
Albumin: 3.2 g/dL — ABNORMAL LOW (ref 3.5–5.0)
Alkaline Phosphatase: 83 U/L (ref 38–126)
Anion gap: 10 (ref 5–15)
BUN: 22 mg/dL (ref 8–23)
CO2: 21 mmol/L — ABNORMAL LOW (ref 22–32)
Calcium: 9.7 mg/dL (ref 8.9–10.3)
Chloride: 110 mmol/L (ref 98–111)
Creatinine, Ser: 1.13 mg/dL — ABNORMAL HIGH (ref 0.44–1.00)
GFR, Estimated: 49 mL/min — ABNORMAL LOW (ref >60)
Glucose, Bld: 141 mg/dL — ABNORMAL HIGH (ref 70–99)
Potassium: 3.8 mmol/L (ref 3.5–5.1)
Sodium: 141 mmol/L (ref 135–145)
Total Bilirubin: 0.3 mg/dL (ref 0.3–1.2)
Total Protein: 6 g/dL — ABNORMAL LOW (ref 6.5–8.1)

## 2020-12-10 LAB — URINE CULTURE

## 2020-12-10 LAB — D-DIMER, QUANTITATIVE: D-Dimer, Quant: 1.09 ug/mL-FEU — ABNORMAL HIGH (ref 0.00–0.50)

## 2020-12-10 LAB — PHOSPHORUS: Phosphorus: 3.5 mg/dL (ref 2.5–4.6)

## 2020-12-10 LAB — C-REACTIVE PROTEIN: CRP: <0.5 mg/dL (ref <1.0)

## 2020-12-10 LAB — FERRITIN: Ferritin: 286 ng/mL (ref 11–307)

## 2020-12-10 NOTE — Progress Notes (Signed)
SATURATION QUALIFICATIONS: (This note is used to comply with regulatory documentation for home oxygen)  Patient Saturations on Room Air at Rest = 95%  Patient Saturations on Room Air while Ambulating = 90%  Patient Saturations on 0 Liters of oxygen while Ambulating = 90%  Please briefly explain why patient needs home oxygen: 

## 2020-12-10 NOTE — Evaluation (Addendum)
Physical Therapy Evaluation Patient Details Name: Phyllis Caldwell MRN: [id number] DOB: 1941-01-06 Today's Date: 12/10/2020   History of Present Illness  Pt adm with covid PNA. PMH - copd, cad, vertigo, htn, depression.  Clinical Impression  Pt presents to PT with slightly unsteady gait due to illness and inactivity. Expect pt will make good progress back to baseline with mobility. Will follow acutely but doubt pt will need PT after DC. Pt did well with rollator but was indecisive about wanting one for home.  Pt was on RA with SpO2 >90% throughout.      Follow Up Recommendations No PT follow up    Equipment Recommendations  Other (comment) (rollator if pt agreeable)    Recommendations for Other Services       Precautions / Restrictions        Mobility  Bed Mobility               General bed mobility comments: Pt up in chair    Transfers Overall transfer level: Needs assistance Equipment used: Straight cane Transfers: Sit to/from Stand Sit to Stand: Supervision         General transfer comment: supervision for safety  Ambulation/Gait Ambulation/Gait assistance: Supervision   Assistive device: Straight cane;4-wheeled walker Gait Pattern/deviations: Step-through pattern;Decreased stride length Gait velocity: decr   General Gait Details: Slightly unsteady gait with cane but no overt loss of balance. Incr stability with rollator  Stairs   Stairs assistance: Min guard Stair Management: One rail Right;Alternating pattern   General stair comments: 1 step x 2 with counter top simulating rail  Wheelchair Mobility    Modified Rankin (Stroke Patients Only)       Balance Overall balance assessment: Needs assistance Sitting-balance support: No upper extremity supported;Feet supported Sitting balance-Leahy Scale: Normal     Standing balance support: No upper extremity supported Standing balance-Leahy Scale: Fair                                Pertinent Vitals/Pain Pain Assessment: Faces Faces Pain Scale: No hurt    Home Living Family/patient expects to be discharged to:: Private residence Living Arrangements: Children   Type of Home: Apartment Home Access: Level entry     Home Layout: One level Home Equipment: Cane - single point      Prior Function Level of Independence: Independent with assistive device(s)         Comments: Uses cane     Hand Dominance   Dominant Hand: Right    Extremity/Trunk Assessment   Upper Extremity Assessment Upper Extremity Assessment: Generalized weakness    Lower Extremity Assessment Lower Extremity Assessment: Generalized weakness       Communication   Communication: No difficulties  Cognition Arousal/Alertness: Awake/alert Behavior During Therapy: WFL for tasks assessed/performed Overall Cognitive Status: Within Functional Limits for tasks assessed                                        General Comments      Exercises     Assessment/Plan    PT Assessment Patient needs continued PT services  PT Problem List Decreased strength;Decreased balance;Decreased mobility       PT Treatment Interventions DME instruction;Gait training;Functional mobility training;Therapeutic activities;Therapeutic exercise;Balance training;Patient/family education    PT Goals (Current goals can be found in the Care Plan section)  Acute Rehab PT Goals Patient Stated Goal: not stated PT Goal Formulation: With patient Time For Goal Achievement: 12/17/20 Potential to Achieve Goals: Good    Frequency Min 3X/week   Barriers to discharge        Co-evaluation               AM-PAC PT "6 Clicks" Mobility  Outcome Measure Help needed turning from your back to your side while in a flat bed without using bedrails?: None Help needed moving from lying on your back to sitting on the side of a flat bed without using bedrails?: None Help needed moving to and  from a bed to a chair (including a wheelchair)?: None Help needed standing up from a chair using your arms (e.g., wheelchair or bedside chair)?: None Help needed to walk in hospital room?: A Little Help needed climbing 3-5 steps with a railing? : A Little 6 Click Score: 22    End of Session   Activity Tolerance: Patient tolerated treatment well Patient left: in chair;with call bell/phone within reach Nurse Communication: Mobility status PT Visit Diagnosis: Unsteadiness on feet (R26.81)    Time:  -      Charges:              [name] PT Acute Rehabilitation Services Pager [phone] Office [phone]   [name] [facility] 12/10/2020, 2:45 PM

## 2020-12-10 NOTE — TOC Initial Note (Signed)
Transition of Care [facility]) - Initial/Assessment Note    Patient Details  Name: Phyllis Caldwell MRN: [id number] Date of Birth: 1941/04/01  Transition of Care [facility]) CM/SW Contact:    Lockie Pares, RN Phone Number: 12/10/2020, 12:26 PM  Clinical Narrative:                 Patient day 2 with COVID pneumonia UTI, Patient being evaluated by PT for recommendations. She is up with assistance here, having some issue with coping, has drama at home. Sometimes has depressions. Not on amy medications for this.  CM will follow and order supplies as recommended for home .   Expected Discharge Plan: Home w Home Health Services Barriers to Discharge: Continued Medical Work up   Patient Goals and CMS Choice        Expected Discharge Plan and Services Expected Discharge Plan: Home w Home Health Services   Discharge Planning Services: CM Consult   Living arrangements for the past 2 months: Single Family Home                                      Prior Living Arrangements/Services Living arrangements for the past 2 months: Single Family Home Lives with:: Adult Children Patient language and need for interpreter reviewed:: Yes        Need for Family Participation in Patient Care: Yes (Comment) Care giver support system in place?: Yes (comment)   Criminal Activity/Legal Involvement Pertinent to Current Situation/Hospitalization: No - Comment as needed  Activities of Daily Living Home Assistive Devices/Equipment: Cane (specify quad or straight),Eyeglasses ADL Screening (condition at time of admission) Patient's cognitive ability adequate to safely complete daily activities?: Yes Is the patient deaf or have difficulty hearing?: No Does the patient have difficulty seeing, even when wearing glasses/contacts?: No Does the patient have difficulty concentrating, remembering, or making decisions?: No Patient able to express need for assistance with ADLs?: Yes Does the patient have  difficulty dressing or bathing?: No Independently performs ADLs?: Yes (appropriate for developmental age) Does the patient have difficulty walking or climbing stairs?: No Weakness of Legs: None Weakness of Arms/Hands: None  Permission Sought/Granted                  Emotional Assessment       Orientation: : Oriented to  Time,Oriented to Place,Oriented to Self,Oriented to Situation   Psych Involvement: No (comment)  Admission diagnosis:  Generalized weakness [R53.1] Pneumonia due to COVID-19 virus [U07.1, J12.82] COVID-19 [U07.1] Patient Active Problem List   Diagnosis Date Noted  . Pneumonia due to COVID-19 virus 12/09/2020  . Chest pain 12/09/2020  . Renal insufficiency 12/09/2020  . Acute lower UTI 12/09/2020  . COVID-19 12/09/2020  . Dysarthria 01/27/2019  . TIA (transient ischemic attack) 01/26/2019  . Cellulitis diffuse, face 09/09/2016  . Poor dentition 09/09/2016  . Hypothyroidism   . Hypertension   . COPD (chronic obstructive pulmonary disease) (HCC) 12/30/2013  . Tobacco use 12/30/2013  . CAD in native artery 12/30/2013  . Old MI (myocardial infarction) 12/30/2013  . Essential hypertension 12/30/2013   PCP:  Mila Palmer, MD Pharmacy:   [facility] [address] ([place]), [place] - [address] [place] [address] Phone: [phone] Fax: [phone]  [id number] - [place], [place] - [address] AT [facility] [address]  [place] [address] Phone: [phone] Fax: [phone]     Social Determinants of Health (SDOH) Interventions    Readmission Risk Interventions No flowsheet data found.

## 2020-12-10 NOTE — ED Notes (Signed)
Pt up to BSC with one assist 

## 2020-12-10 NOTE — Progress Notes (Addendum)
Patient states she sometimes feels overwhelmed and wants to stop taking her medications. When asked if she intends or plans to harm herself, patient states she doesn't at this time but is tired of dealing with family issues.   Patient was asked directly if she had any plans to harm herself because we wanted to make sure she was safe in the hospital and at home.  She stated no plans at this time to harm herself, said she was just tired and depressed sometimes. Conversation reported to physician so that care team is aware.

## 2020-12-10 NOTE — ED Notes (Signed)
Pt up to bedside commode with one assist.

## 2020-12-10 NOTE — Progress Notes (Addendum)
PROGRESS NOTE                                                                                                                                                                                                             Patient Demographics:    Phyllis Caldwell, is a 79 y.o. female, DOB - January 23, 1941, WUJ:[id number]  Outpatient Primary MD for the patient is Mila Palmer, MD   Admit date - 12/08/2020   LOS - 1  Chief Complaint  Patient presents with  . Generalized Body Aches       Brief Narrative: Patient is a 79 y.o. female with PMHx of COPD, CAD-s/p history of remote PCI, history of thyroid cancer, hypothyroidism, chronic vertigo-who presented with fatigue, myalgias, fever-found to have COVID-19 infection and subsequently admitted to the hospitalist service.  COVID-19 vaccinated status: Unvaccinated  Significant Events: 12/30>> Admit to [facility] for fatigue/malaise/fever due to COVID-19 infection.  Significant studies: 12/29>>Chest x-ray: Minimal vague bibasilar airspace disease-atelectasis versus pneumonia 12/30>> CTA chest: No PE, 4.8 cm ascending aortic aneurysm  COVID-19 medications: Steroids: 12/29>> Remdesivir: 12/29>>  Antibiotics: Rocephin: 12/30>>  Microbiology data: 12/30 >>blood culture: No growth  Procedures: None  Consults: None  DVT prophylaxis: enoxaparin (LOVENOX) injection 40 mg Start: 12/09/20 0815    Subjective:    Phyllis Caldwell today feels much better today-was transitioned to room air early this morning.  She is a very poor historian-and is very vague about her symptoms-asked me to call her daughter to find out further.   Assessment  & Plan :   Acute Hypoxic Resp Failure due to possible Covid 19 Viral pneumonia: Rapidly improving-titrated to room air today.  Reviewed imaging studies-doubt that she has any significant infiltrates.  Given her advanced age/other risk factors-reasonable  to continue to monitor for 1 more day-to ensure stability before considering discharge.  Remains on steroid/Remdesivir for now-but suspect that if clinical improvement continues-she will no longer needs to be on Remdesivir on discharge.  Fever: afebrile O2 requirements:  SpO2: 96 %   COVID-19 Labs: Recent Labs    12/09/20 0921 12/10/20 0210  DDIMER 1.37* 1.09*  FERRITIN 222 286  LDH 145  --   CRP 0.6 <0.5       Component Value Date/Time   BNP 73.4 12/09/2020 0921    Recent Labs  Lab 12/09/20 0921  PROCALCITON <  0.10    Lab Results  Component Value Date   SARSCOV2NAA POSITIVE (A) 12/09/2020      Prone/Incentive Spirometry: encouraged incentive spirometry use 3-4/hour.  Atypical chest pain: Resolved-CTA chest negative-troponins negative.  Suspect musculoskeletal etiology in the setting of cough.  UTI: Very weak symptoms-poor historian-plan 3 days of Rocephin.  COPD: Stable-no exacerbation-continue bronchodilators  HTN: BP currently stable-but will plan on resuming telmisartan on discharge.  HLD: Continue statin  Hypothyroidism: Continue levothyroxine  GERD: Continue Protonix  ABG:    Component Value Date/Time   TCO2 19 10/07/2012 1507    Vent Settings: N/A    Condition - Stable  Family Communication  :  Daughter Herbert Seta [phone]) updated over the phone on 12/31  Code Status :  Full Code  Diet :  Diet Order            Diet Heart Room service appropriate? Yes; Fluid consistency: Thin  Diet effective now                  Disposition Plan  :   Status is: Inpatient  Remains inpatient appropriate because:Inpatient level of care appropriate due to severity of illness   Dispo: The patient is from: Home              Anticipated d/c is to: Home              Anticipated d/c date is: 1 day              Patient currently is not medically stable to d/c.  Barriers to discharge: Hypoxia requiring O2 supplementation/complete 5 days of IV  Remdesivir  Antimicorbials  :    Anti-infectives (From admission, onward)   Start     Dose/Rate Route Frequency Ordered Stop   12/10/20 1000  remdesivir 100 mg in sodium chloride 0.9 % 100 mL IVPB       "Followed by" Linked Group Details   100 mg 200 mL/hr over 30 Minutes Intravenous Daily 12/09/20 0324 12/14/20 0959   12/09/20 0815  cefTRIAXone (ROCEPHIN) 1 g in sodium chloride 0.9 % 100 mL IVPB        1 g 200 mL/hr over 30 Minutes Intravenous Every 24 hours 12/09/20 0811     12/09/20 0430  remdesivir 200 mg in sodium chloride 0.9% 250 mL IVPB       "Followed by" Linked Group Details   200 mg 580 mL/hr over 30 Minutes Intravenous Once 12/09/20 0324 12/09/20 0559      Inpatient Medications  Scheduled Meds: . vitamin C  500 mg Oral Daily  . enoxaparin (LOVENOX) injection  40 mg Subcutaneous Q24H  . Ipratropium-Albuterol  1 puff Inhalation Q6H  . levothyroxine  50 mcg Oral QAC breakfast  . methylPREDNISolone (SOLU-MEDROL) injection  40 mg Intravenous Q12H   Followed by  . [START ON 12/12/2020] predniSONE  50 mg Oral Daily  . pantoprazole  40 mg Oral Q1200  . rosuvastatin  10 mg Oral Q M,W,F  . sodium chloride flush  3 mL Intravenous Q12H  . zinc sulfate  220 mg Oral Daily   Continuous Infusions: . cefTRIAXone (ROCEPHIN)  IV 1 g (12/10/20 0831)  . remdesivir 100 mg in NS 100 mL 100 mg (12/10/20 1009)   PRN Meds:.acetaminophen, chlorpheniramine-HYDROcodone, guaiFENesin-dextromethorphan, HYDROcodone-acetaminophen, ondansetron **OR** ondansetron (ZOFRAN) IV   Time Spent in minutes  25  See all Orders from today for further details   Jeoffrey Massed M.D on 12/10/2020 at 1:29 PM  To  page go to [email or web address] - use universal password  [facility] -  Office  [phone]    Objective:   Vitals:   12/10/20 0400 12/10/20 0507 12/10/20 0700 12/10/20 1243  BP: 121/82 114/72 131/87 112/81  Pulse: (!) 50 60 (!) 51 85  Resp: (!) 22 20 17 19   Temp:  98.1 F (36.7 C)  97.7 F (36.5 C) 98.4 F (36.9 C)  TempSrc:  Oral Oral Oral  SpO2: 94% 94% 92% 96%  Weight: 79.6 kg     Height: 5\' 7"  (1.702 m)       Wt Readings from Last 3 Encounters:  12/10/20 79.6 kg  05/13/20 78 kg  03/18/20 77.7 kg     Intake/Output Summary (Last 24 hours) at 12/10/2020 1329 Last data filed at 12/10/2020 1243 Gross per 24 hour  Intake 1120 ml  Output --  Net 1120 ml     Physical Exam Gen Exam:Alert awake-not in any distress HEENT:atraumatic, normocephalic Chest: B/L clear to auscultation anteriorly CVS:S1S2 regular Abdomen:soft non tender, non distended Extremities:no edema Neurology: Non focal Skin: no rash   Data Review:    CBC Recent Labs  Lab 12/08/20 1230 12/10/20 0210  WBC 4.0 4.4  HGB 12.5 12.1  HCT 37.2 36.5  PLT 223 169  MCV 92.1 92.4  MCH 30.9 30.6  MCHC 33.6 33.2  RDW 13.2 13.5  LYMPHSABS  --  0.7  MONOABS  --  0.2  EOSABS  --  0.0  BASOSABS  --  0.0    Chemistries  Recent Labs  Lab 12/08/20 1230 12/09/20 0921 12/10/20 0210  NA 138  --  141  K 3.8  --  3.8  CL 106  --  110  CO2 22  --  21*  GLUCOSE 114*  --  141*  BUN 15  --  22  CREATININE 1.11*  --  1.13*  CALCIUM 9.7  --  9.7  MG  --   --  2.0  AST  --  20 21  ALT  --  14 12  ALKPHOS  --  88 83  BILITOT  --  0.7 0.3   ------------------------------------------------------------------------------------------------------------------ No results for input(s): CHOL, HDL, LDLCALC, TRIG, CHOLHDL, LDLDIRECT in the last 72 hours.  Lab Results  Component Value Date   HGBA1C 5.4 12/02/2019   ------------------------------------------------------------------------------------------------------------------ Recent Labs    12/09/20 0049  TSH 0.262*   ------------------------------------------------------------------------------------------------------------------ Recent Labs    12/09/20 0921 12/10/20 0210  FERRITIN 222 286    Coagulation profile No results for  input(s): INR, PROTIME in the last 168 hours.  Recent Labs    12/09/20 0921 12/10/20 0210  DDIMER 1.37* 1.09*    Cardiac Enzymes No results for input(s): CKMB, TROPONINI, MYOGLOBIN in the last 168 hours.  Invalid input(s): CK ------------------------------------------------------------------------------------------------------------------    Component Value Date/Time   BNP 73.4 12/09/2020 [id number]    Micro Results Recent Results (from the past 240 hour(s))  Urine culture     Status: Abnormal   Collection Time: 12/08/20 10:25 PM   Specimen: Urine, Random  Result Value Ref Range Status   Specimen Description URINE, RANDOM  Final   Special Requests   Final    NONE Performed at [facility], [address] [place], [place] [id number]    Culture MULTIPLE SPECIES PRESENT, SUGGEST RECOLLECTION (A)  Final   Report Status 12/10/2020 FINAL  Final  Blood culture (routine x 2)     Status: None (Preliminary result)   Collection Time:  12/09/20 12:40 AM   Specimen: BLOOD RIGHT HAND  Result Value Ref Range Status   Specimen Description BLOOD RIGHT HAND  Final   Special Requests   Final    BOTTLES DRAWN AEROBIC AND ANAEROBIC Blood Culture adequate volume   Culture   Final    NO GROWTH 1 DAY Performed at [facility], [address], [place], [place] [address]    Report Status PENDING  Incomplete  Resp Panel by RT-PCR (Flu A&B, Covid) Nasopharyngeal Swab     Status: Abnormal   Collection Time: 12/09/20  1:05 AM   Specimen: Nasopharyngeal Swab; Nasopharyngeal(NP) swabs in vial transport medium  Result Value Ref Range Status   SARS Coronavirus 2 by RT PCR POSITIVE (A) NEGATIVE Final    Comment: RESULT CALLED TO, READ BACK BY AND VERIFIED WITH: S WIRTZ RN 12/09/20 0314 JDW (NOTE) SARS-CoV-2 target nucleic acids are DETECTED.  The SARS-CoV-2 RNA is generally detectable in upper respiratory specimens during the acute phase of infection. Positive results are indicative of the  presence of the identified virus, but do not rule out bacterial infection or co-infection with other pathogens not detected by the test. Clinical correlation with patient history and other diagnostic information is necessary to determine patient infection status. The expected result is Negative.  Fact Sheet for Patients: BloggerCourse.com  Fact Sheet for Healthcare Providers: SeriousBroker.it  This test is not yet approved or cleared by the [place] FDA and  has been authorized for detection and/or diagnosis of SARS-CoV-2 by FDA under an Emergency Use Authorization (EUA).  This EUA will remain in effect (meaning this test can be used ) for the duration of  the COVID-19 declaration under Section 564(b)(1) of the Act, 21 U.S.C. section 360bbb-3(b)(1), unless the authorization is terminated or revoked sooner.     Influenza A by PCR NEGATIVE NEGATIVE Final   Influenza B by PCR NEGATIVE NEGATIVE Final    Comment: (NOTE) The Xpert Xpress SARS-CoV-2/FLU/RSV plus assay is intended as an aid in the diagnosis of influenza from Nasopharyngeal swab specimens and should not be used as a sole basis for treatment. Nasal washings and aspirates are unacceptable for Xpert Xpress SARS-CoV-2/FLU/RSV testing.  Fact Sheet for Patients: BloggerCourse.com  Fact Sheet for Healthcare Providers: SeriousBroker.it  This test is not yet approved or cleared by the [place] FDA and has been authorized for detection and/or diagnosis of SARS-CoV-2 by FDA under an Emergency Use Authorization (EUA). This EUA will remain in effect (meaning this test can be used) for the duration of the COVID-19 declaration under Section 564(b)(1) of the Act, 21 U.S.C. section 360bbb-3(b)(1), unless the authorization is terminated or revoked.  Performed at [facility], [address] [place], [place] [address]    Blood culture (routine x 2)     Status: None (Preliminary result)   Collection Time: 12/09/20  9:21 AM   Specimen: BLOOD  Result Value Ref Range Status   Specimen Description BLOOD RIGHT ANTECUBITAL  Final   Special Requests   Final    BOTTLES DRAWN AEROBIC AND ANAEROBIC Blood Culture adequate volume   Culture   Final    NO GROWTH < 24 HOURS Performed at [facility], [address], [place], [place] [address]    Report Status PENDING  Incomplete    Radiology Reports CT ANGIO CHEST PE W OR WO CONTRAST  Result Date: 12/09/2020 CLINICAL DATA:  79 year old female with a history of possible pulmonary embolism EXAM: CT ANGIOGRAPHY  CHEST WITH CONTRAST TECHNIQUE: Multidetector CT imaging of the chest was performed using the standard protocol during bolus administration of intravenous contrast. Multiplanar CT image reconstructions and MIPs were obtained to evaluate the vascular anatomy. CONTRAST:  81mL OMNIPAQUE IOHEXOL 350 MG/ML SOLN COMPARISON:  02/21/2016 FINDINGS: Cardiovascular: Heart: Cardiomegaly again demonstrated. No pericardial fluid/thickening. Calcifications of left anterior descending, circumflex, right coronary arteries. Redemonstration of focal subendocardial fibrofatty remodeling of the free wall left ventricle. Aorta: Ascending aorta diameter is estimated 4.8 cm. Atherosclerotic changes of the aortic arch and descending thoracic aorta. No periaortic fluid. Pulmonary arteries: No central, lobar, segmental, or proximal subsegmental filling defects. Mediastinum/Nodes: Surgical changes of the thyroid. Unremarkable thoracic inlet. Unremarkable thoracic esophagus with small hiatal hernia. No mediastinal adenopathy. Lungs/Pleura: Central airways are clear. No pleural effusion. No confluent airspace disease. Mild scarring/atelectasis in the dependent lungs.  No pneumothorax. Upper Abdomen: No acute. Musculoskeletal: No acute displaced fracture. Degenerative changes of the spine. Scoliotic  curvature again noted. Osseous hemangioma thoracic vertebral body T5. Review of the MIP images confirms the above findings. IMPRESSION: CT negative for pulmonary embolism.  No acute CT finding. Ascending aorta estimated 4.8 cm. Recommend semi-annual imaging followup by CTA or MRA and referral to cardiothoracic surgery if not already obtained. This recommendation follows 2010 ACCF/AHA/AATS/ACR/ASA/SCA/SCAI/SIR/STS/SVM Guidelines for the Diagnosis and Management of Patients With Thoracic Aortic Disease. Circulation. 2010; 121: Z308-M578. Aortic aneurysm NOS (ICD10-I71.9) Aortic Atherosclerosis (ICD10-I70.0). Associated coronary artery disease. Electronically Signed   By: Gilmer Mor D.O.   On: 12/09/2020 16:23   DG Chest Port 1 View  Result Date: 12/08/2020 CLINICAL DATA:  Cough, shortness of breath, weakness. Generalized body aches and fatigue. EXAM: PORTABLE CHEST 1 VIEW COMPARISON:  None. FINDINGS: Upper normal heart size. Aortic tortuosity. Minimal vague bibasilar opacities. No pulmonary edema, pleural effusion or pneumothorax. No acute osseous abnormalities are seen. IMPRESSION: Minimal vague bibasilar opacities, atelectasis versus pneumonia. Electronically Signed   By: Narda Rutherford M.D.   On: 12/08/2020 23:04

## 2020-12-11 DIAGNOSIS — E039 Hypothyroidism, unspecified: Secondary | ICD-10-CM | POA: Diagnosis not present

## 2020-12-11 DIAGNOSIS — U071 COVID-19: Secondary | ICD-10-CM | POA: Diagnosis not present

## 2020-12-11 DIAGNOSIS — I10 Essential (primary) hypertension: Secondary | ICD-10-CM | POA: Diagnosis not present

## 2020-12-11 DIAGNOSIS — J432 Centrilobular emphysema: Secondary | ICD-10-CM | POA: Diagnosis not present

## 2020-12-11 LAB — COMPREHENSIVE METABOLIC PANEL
ALT: 12 U/L (ref 0–44)
AST: 18 U/L (ref 15–41)
Albumin: 3.3 g/dL — ABNORMAL LOW (ref 3.5–5.0)
Alkaline Phosphatase: 75 U/L (ref 38–126)
Anion gap: 8 (ref 5–15)
BUN: 24 mg/dL — ABNORMAL HIGH (ref 8–23)
CO2: 24 mmol/L (ref 22–32)
Calcium: 9.7 mg/dL (ref 8.9–10.3)
Chloride: 110 mmol/L (ref 98–111)
Creatinine, Ser: 1.05 mg/dL — ABNORMAL HIGH (ref 0.44–1.00)
GFR, Estimated: 54 mL/min — ABNORMAL LOW (ref >60)
Glucose, Bld: 137 mg/dL — ABNORMAL HIGH (ref 70–99)
Potassium: 4.2 mmol/L (ref 3.5–5.1)
Sodium: 142 mmol/L (ref 135–145)
Total Bilirubin: 0.2 mg/dL — ABNORMAL LOW (ref 0.3–1.2)
Total Protein: 6 g/dL — ABNORMAL LOW (ref 6.5–8.1)

## 2020-12-11 LAB — CBC WITH DIFFERENTIAL/PLATELET
Abs Immature Granulocytes: 0.07 10*3/uL (ref 0.00–0.07)
Basophils Absolute: 0 10*3/uL (ref 0.0–0.1)
Basophils Relative: 0 %
Eosinophils Absolute: 0 10*3/uL (ref 0.0–0.5)
Eosinophils Relative: 0 %
HCT: 37.8 % (ref 36.0–46.0)
Hemoglobin: 12.4 g/dL (ref 12.0–15.0)
Immature Granulocytes: 1 %
Lymphocytes Relative: 8 %
Lymphs Abs: 0.8 10*3/uL (ref 0.7–4.0)
MCH: 29.6 pg (ref 26.0–34.0)
MCHC: 32.8 g/dL (ref 30.0–36.0)
MCV: 90.2 fL (ref 80.0–100.0)
Monocytes Absolute: 0.3 10*3/uL (ref 0.1–1.0)
Monocytes Relative: 3 %
Neutro Abs: 9.5 10*3/uL — ABNORMAL HIGH (ref 1.7–7.7)
Neutrophils Relative %: 88 %
Platelets: 221 10*3/uL (ref 150–400)
RBC: 4.19 MIL/uL (ref 3.87–5.11)
RDW: 13.7 % (ref 11.5–15.5)
WBC: 10.7 10*3/uL — ABNORMAL HIGH (ref 4.0–10.5)
nRBC: 0 % (ref 0.0–0.2)

## 2020-12-11 LAB — FERRITIN: Ferritin: 376 ng/mL — ABNORMAL HIGH (ref 11–307)

## 2020-12-11 LAB — D-DIMER, QUANTITATIVE: D-Dimer, Quant: 0.59 ug/mL-FEU — ABNORMAL HIGH (ref 0.00–0.50)

## 2020-12-11 LAB — C-REACTIVE PROTEIN: CRP: 0.5 mg/dL (ref <1.0)

## 2020-12-11 LAB — MAGNESIUM: Magnesium: 2 mg/dL (ref 1.7–2.4)

## 2020-12-11 MED ORDER — ALBUTEROL SULFATE HFA 108 (90 BASE) MCG/ACT IN AERS: 2.0000 | INHALATION_SPRAY | Freq: Four times a day (QID) | RESPIRATORY_TRACT | 0 refills | Status: AC | PRN

## 2020-12-11 MED ORDER — BREO ELLIPTA 100-25 MCG/INH IN AEPB: 1.0000 | INHALATION_SPRAY | Freq: Every day | RESPIRATORY_TRACT | 0 refills | Status: AC

## 2020-12-11 MED ORDER — PREDNISONE 10 MG PO TABS: ORAL_TABLET | ORAL | 0 refills | Status: DC

## 2020-12-11 MED ORDER — BENZONATATE 100 MG PO CAPS: 100.0000 mg | ORAL_CAPSULE | Freq: Four times a day (QID) | ORAL | 0 refills | Status: AC | PRN

## 2020-12-11 MED ORDER — MENTHOL 3 MG MT LOZG
1.0000 | LOZENGE | OROMUCOSAL | Status: DC | PRN
  Administered 2020-12-11: 3 mg via ORAL
  Filled 2020-12-11: qty 9

## 2020-12-11 NOTE — Discharge Summary (Signed)
PATIENT DETAILS Name: Phyllis Caldwell Age: 80 y.o. Sex: female Date of Birth: 1941/05/27 MRN: [id number]. Admitting Physician: Clydie Braun, MD YIF:OYDXAJO, Jasmine December, MD  Admit Date: 12/08/2020 Discharge date: 12/11/2020  Recommendations for Outpatient Follow-up:  1. Follow up with PCP in 1-2 weeks 2. Please obtain CMP/CBC in one week 3. Repeat Chest Xray in 4-6 week 4. Incidental finding-ascending aortic aneurysm-4.8 cm-primary care practitioner to continue outpatient surveillance  Admitted From:  Home  Disposition: Home   Home Health: Yes  Equipment/Devices: None  Discharge Condition: Stable  CODE STATUS: FULL CODE  Diet recommendation:  Diet Order            Diet - low sodium heart healthy           Diet Heart Room service appropriate? Yes; Fluid consistency: Thin  Diet effective now                  Brief Narrative: Patient is a 80 y.o. female with PMHx of COPD, CAD-s/p history of remote PCI, history of thyroid cancer, hypothyroidism, chronic vertigo-who presented with fatigue, myalgias, fever-found to have COVID-19 infection and subsequently admitted to the hospitalist service.  COVID-19 vaccinated status: Unvaccinated  Significant Events: 12/30>> Admit to [facility] for fatigue/malaise/fever due to COVID-19 infection.  Significant studies: 12/29>>Chest x-ray: Minimal vague bibasilar airspace disease-atelectasis versus pneumonia 12/30>> CTA chest: No PE, 4.8 cm ascending aortic aneurysm  COVID-19 medications: Steroids: 12/29>> Remdesivir: 12/29>>12/11/20  Antibiotics: Rocephin: 12/30>>12/11/20  Microbiology data: 12/30 >>blood culture: No growth  Procedures: None  Consults: None   Brief Hospital Course: Acute Hypoxic Resp Failure due to possible Covid 19 Viral pneumonia:  Rapidly improved-on room air for the past 2 days-treated with a combination of steroid/Remdesivir.  Imaging studies without any obvious infiltrates-but given her  advanced age/risk factors-she was treated as if she has pneumonia given her clinical symptomatology and hypoxemia.  Doubt she requires any further Remdesivir infusions-will be placed on tapering steroids for a few more days.  Stable for discharge today.  Patient aware that if her shortness of breath reoccurs or worsens-she needs to seek immediate medical attention.  COVID-19 Labs:  Recent Labs    12/09/20 0921 12/10/20 0210 12/11/20 0419  DDIMER 1.37* 1.09* 0.59*  FERRITIN 222 286 376*  LDH 145  --   --   CRP 0.6 <0.5 0.5    Lab Results  Component Value Date   SARSCOV2NAA POSITIVE (A) 12/09/2020     Atypical chest pain: Resolved-CTA chest negative-troponins negative.  Suspect musculoskeletal etiology in the setting of cough.  UTI: Very vague symptoms-poor historian-completed 3 days of Rocephin.  COPD: Stable-no exacerbation-continue bronchodilators  HTN: BP currently stable-but will plan on resuming amlodipine and telmisartan on discharge.  HLD: Continue statin  Hypothyroidism: Continue levothyroxine  GERD: Continue Protonix  Incidental finding of ascending aortic aneurysm on CT chest: Stable for outpatient follow-up/surveillance by PCP.  Chronic vertigo/occasional falls: Chronic issue per patient-seen by PT-no further recommendations at this point.  Continue outpatient follow-up with PCP.   Discharge Diagnoses:  Principal Problem:   Pneumonia due to COVID-19 virus Active Problems:   COPD (chronic obstructive pulmonary disease) (HCC)   Essential hypertension   Hypothyroidism   Chest pain   Renal insufficiency   Acute lower UTI   COVID-19   Discharge Instructions:    Person Under Monitoring Name: Phyllis Caldwell  Location: [address] [place] [address]   Infection Prevention Recommendations for Individuals Confirmed to have, or Being  Evaluated for, 2019 Novel Coronavirus (COVID-19) Infection Who Receive Care at Home  Individuals who  are confirmed to have, or are being evaluated for, COVID-19 should follow the prevention steps below until a healthcare provider or local or state health department says they can return to normal activities.  Stay home except to get medical care You should restrict activities outside your home, except for getting medical care. Do not go to work, school, or public areas, and do not use public transportation or taxis.  Call ahead before visiting your doctor Before your medical appointment, call the healthcare provider and tell them that you have, or are being evaluated for, COVID-19 infection. This will help the healthcare provider's office take steps to keep other people from getting infected. Ask your healthcare provider to call the local or state health department.  Monitor your symptoms Seek prompt medical attention if your illness is worsening (e.g., difficulty breathing). Before going to your medical appointment, call the healthcare provider and tell them that you have, or are being evaluated for, COVID-19 infection. Ask your healthcare provider to call the local or state health department.  Wear a facemask You should wear a facemask that covers your nose and mouth when you are in the same room with other people and when you visit a healthcare provider. People who live with or visit you should also wear a facemask while they are in the same room with you.  Separate yourself from other people in your home As much as possible, you should stay in a different room from other people in your home. Also, you should use a separate bathroom, if available.  Avoid sharing household items You should not share dishes, drinking glasses, cups, eating utensils, towels, bedding, or other items with other people in your home. After using these items, you should wash them thoroughly with soap and water.  Cover your coughs and sneezes Cover your mouth and nose with a tissue when you cough or sneeze,  or you can cough or sneeze into your sleeve. Throw used tissues in a lined trash can, and immediately wash your hands with soap and water for at least 20 seconds or use an alcohol-based hand rub.  Wash your [employer] Corporationhands Wash your hands often and thoroughly with soap and water for at least 20 seconds. You can use an alcohol-based hand sanitizer if soap and water are not available and if your hands are not visibly dirty. Avoid touching your eyes, nose, and mouth with unwashed hands.   Prevention Steps for Caregivers and Household Members of Individuals Confirmed to have, or Being Evaluated for, COVID-19 Infection Being Cared for in the Home  If you live with, or provide care at home for, a person confirmed to have, or being evaluated for, COVID-19 infection please follow these guidelines to prevent infection:  Follow healthcare provider's instructions Make sure that you understand and can help the patient follow any healthcare provider instructions for all care.  Provide for the patient's basic needs You should help the patient with basic needs in the home and provide support for getting groceries, prescriptions, and other personal needs.  Monitor the patient's symptoms If they are getting sicker, call his or her medical provider and tell them that the patient has, or is being evaluated for, COVID-19 infection. This will help the healthcare provider's office take steps to keep other people from getting infected. Ask the healthcare provider to call the local or state health department.  Limit the number of people who  have contact with the patient  If possible, have only one caregiver for the patient.  Other household members should stay in another home or place of residence. If this is not possible, they should stay  in another room, or be separated from the patient as much as possible. Use a separate bathroom, if available.  Restrict visitors who do not have an essential need to be in the  home.  Keep older adults, very young children, and other sick people away from the patient Keep older adults, very young children, and those who have compromised immune systems or chronic health conditions away from the patient. This includes people with chronic heart, lung, or kidney conditions, diabetes, and cancer.  Ensure good ventilation Make sure that shared spaces in the home have good air flow, such as from an air conditioner or an opened window, weather permitting.  Wash your hands often  Wash your hands often and thoroughly with soap and water for at least 20 seconds. You can use an alcohol based hand sanitizer if soap and water are not available and if your hands are not visibly dirty.  Avoid touching your eyes, nose, and mouth with unwashed hands.  Use disposable paper towels to dry your hands. If not available, use dedicated cloth towels and replace them when they become wet.  Wear a facemask and gloves  Wear a disposable facemask at all times in the room and gloves when you touch or have contact with the patient's blood, body fluids, and/or secretions or excretions, such as sweat, saliva, sputum, nasal mucus, vomit, urine, or feces.  Ensure the mask fits over your nose and mouth tightly, and do not touch it during use.  Throw out disposable facemasks and gloves after using them. Do not reuse.  Wash your hands immediately after removing your facemask and gloves.  If your personal clothing becomes contaminated, carefully remove clothing and launder. Wash your hands after handling contaminated clothing.  Place all used disposable facemasks, gloves, and other waste in a lined container before disposing them with other household waste.  Remove gloves and wash your hands immediately after handling these items.  Do not share dishes, glasses, or other household items with the patient  Avoid sharing household items. You should not share dishes, drinking glasses, cups, eating  utensils, towels, bedding, or other items with a patient who is confirmed to have, or being evaluated for, COVID-19 infection.  After the person uses these items, you should wash them thoroughly with soap and water.  Wash laundry thoroughly  Immediately remove and wash clothes or bedding that have blood, body fluids, and/or secretions or excretions, such as sweat, saliva, sputum, nasal mucus, vomit, urine, or feces, on them.  Wear gloves when handling laundry from the patient.  Read and follow directions on labels of laundry or clothing items and detergent. In general, wash and dry with the warmest temperatures recommended on the label.  Clean all areas the individual has used often  Clean all touchable surfaces, such as counters, tabletops, doorknobs, bathroom fixtures, toilets, phones, keyboards, tablets, and bedside tables, every day. Also, clean any surfaces that may have blood, body fluids, and/or secretions or excretions on them.  Wear gloves when cleaning surfaces the patient has come in contact with.  Use a diluted bleach solution (e.g., dilute bleach with 1 part bleach and 10 parts water) or a household disinfectant with a label that says EPA-registered for coronaviruses. To make a bleach solution at home, add 1 tablespoon of  bleach to 1 quart (4 cups) of water. For a larger supply, add  cup of bleach to 1 gallon (16 cups) of water.  Read labels of cleaning products and follow recommendations provided on product labels. Labels contain instructions for safe and effective use of the cleaning product including precautions you should take when applying the product, such as wearing gloves or eye protection and making sure you have good ventilation during use of the product.  Remove gloves and wash hands immediately after cleaning.  Monitor yourself for signs and symptoms of illness Caregivers and household members are considered close contacts, should monitor their health, and will be  asked to limit movement outside of the home to the extent possible. Follow the monitoring steps for close contacts listed on the symptom monitoring form.   ? If you have additional questions, contact your local health department or call the epidemiologist on call at [phone] (available 24/7). ? This guidance is subject to change. For the most up-to-date guidance from CDC, please refer to their website: TripMetro.hu    Activity:  As tolerated with Full fall precautions use walker/cane & assistance as needed   Discharge Instructions    Call MD for:  difficulty breathing, headache or visual disturbances   Complete by: As directed    Diet - low sodium heart healthy   Complete by: As directed    Discharge instructions   Complete by: As directed    Follow with Primary MD  Mila Palmer, MD in 1-2 weeks  Please get a complete blood count and chemistry panel checked by your Primary MD at your next visit, and again as instructed by your Primary MD.  Get Medicines reviewed and adjusted: Please take all your medications with you for your next visit with your Primary MD  Laboratory/radiological data: Please request your Primary MD to go over all hospital tests and procedure/radiological results at the follow up, please ask your Primary MD to get all Hospital records sent to his/her office.  In some cases, they will be blood work, cultures and biopsy results pending at the time of your discharge. Please request that your primary care M.D. follows up on these results.  Also Note the following: If you experience worsening of your admission symptoms, develop shortness of breath, life threatening emergency, suicidal or homicidal thoughts you must seek medical attention immediately by calling 911 or calling your MD immediately  if symptoms less severe.  You must read complete instructions/literature along with all the possible  adverse reactions/side effects for all the Medicines you take and that have been prescribed to you. Take any new Medicines after you have completely understood and accpet all the possible adverse reactions/side effects.   Do not drive when taking Pain medications or sleeping medications (Benzodaizepines)  Do not take more than prescribed Pain, Sleep and Anxiety Medications. It is not advisable to combine anxiety,sleep and pain medications without talking with your primary care practitioner  Special Instructions: If you have smoked or chewed Tobacco  in the last 2 yrs please stop smoking, stop any regular Alcohol  and or any Recreational drug use.  Wear Seat belts while driving.  Please note: You were cared for by a hospitalist during your hospital stay. Once you are discharged, your primary care physician will handle any further medical issues. Please note that NO REFILLS for any discharge medications will be authorized once you are discharged, as it is imperative that you return to your primary care physician (or establish a relationship  with a primary care physician if you do not have one) for your post hospital discharge needs so that they can reassess your need for medications and monitor your lab values.   Incidental finding of ascending aortic aneurysm seen on your CT chest-please ask your primary care practitioner to continue outpatient surveillance.  Recommend that you wait at least 1 month-before you get your Covid vaccination series.  It is highly recommended that you get vaccinated.  21 days of isolation from the day of first positive symptoms or from day of your first positive test.   Increase activity slowly   Complete by: As directed      Allergies as of 12/11/2020      Reactions   Bee Venom Hives   Coconut Fatty Acids    unknown   Codeine Nausea And Vomiting   Penicillins    Patient doesn't remember   Sulfa Antibiotics    unknown      Medication List    TAKE these  medications   albuterol 108 (90 Base) MCG/ACT inhaler Commonly known as: VENTOLIN HFA Inhale 2 puffs into the lungs every 6 (six) hours as needed for wheezing or shortness of breath.   amLODipine 5 MG tablet Commonly known as: NORVASC Take 1 tablet (5 mg total) by mouth daily. Reported on 01/05/2016   aspirin EC 81 MG tablet Take 162 mg by mouth daily.   benzonatate 100 MG capsule Commonly known as: Tessalon Perles Take 1 capsule (100 mg total) by mouth every 6 (six) hours as needed for cough.   Breo Ellipta 100-25 MCG/INH Aepb Generic drug: fluticasone furoate-vilanterol Inhale 1 puff into the lungs daily.   cholecalciferol 1000 units tablet Commonly known as: VITAMIN D Take 1,000 Units by mouth daily.   co-enzyme Q-10 30 MG capsule Take 30 mg by mouth every Monday, Wednesday, and Friday.   HYDROcodone-acetaminophen 5-325 MG tablet Commonly known as: NORCO/VICODIN Take 1 tablet by mouth as needed for moderate pain.   levothyroxine 50 MCG tablet Commonly known as: SYNTHROID Take 1 tablet (50 mcg total) by mouth daily before breakfast. Reported on 01/05/2016   pantoprazole 40 MG tablet Commonly known as: PROTONIX Take 1 tablet (40 mg total) by mouth daily at 12 noon.   predniSONE 10 MG tablet Commonly known as: DELTASONE Take 40 mg daily for 1 day, 30 mg daily for 1 day, 20 mg daily for 1 days,10 mg daily for 1 day, then stop   rosuvastatin 10 MG tablet Commonly known as: CRESTOR Take 10 mg by mouth every Monday, Wednesday, and Friday.   telmisartan 20 MG tablet Commonly known as: MICARDIS Take 20 mg by mouth daily.   vitamin B-12 500 MCG tablet Commonly known as: CYANOCOBALAMIN Take 500 mcg by mouth daily.            Durable Medical Equipment  (From admission, onward)         Start     Ordered   12/11/20 1034  For home use only DME 4 wheeled rolling walker with seat  Once       Question:  Patient needs a walker to treat with the following condition   Answer:  Physical deconditioning   12/11/20 1033   12/11/20 1020  For home use only DME Walker rolling  Once       Question Answer Comment  Walker: With 5 Inch Wheels   Patient needs a walker to treat with the following condition Weakness      12/11/20  1020          Follow-up Information    [facility] Follow up.   Why: the company for your walker Contact information: [address] [address] [phone]        Mila Palmer, MD. Schedule an appointment as soon as possible for a visit.   Specialty: Family Medicine Why: 1-2 weeks Contact information: [address] [place] [address] [phone]              Allergies  Allergen Reactions  . Bee Venom Hives  . Coconut Fatty Acids     unknown  . Codeine Nausea And Vomiting  . Penicillins     Patient doesn't remember  . Sulfa Antibiotics     unknown      Other Procedures/Studies: CT ANGIO CHEST PE W OR WO CONTRAST  Result Date: 12/09/2020 CLINICAL DATA:  80 year old female with a history of possible pulmonary embolism EXAM: CT ANGIOGRAPHY CHEST WITH CONTRAST TECHNIQUE: Multidetector CT imaging of the chest was performed using the standard protocol during bolus administration of intravenous contrast. Multiplanar CT image reconstructions and MIPs were obtained to evaluate the vascular anatomy. CONTRAST:  65mL OMNIPAQUE IOHEXOL 350 MG/ML SOLN COMPARISON:  02/21/2016 FINDINGS: Cardiovascular: Heart: Cardiomegaly again demonstrated. No pericardial fluid/thickening. Calcifications of left anterior descending, circumflex, right coronary arteries. Redemonstration of focal subendocardial fibrofatty remodeling of the free wall left ventricle. Aorta: Ascending aorta diameter is estimated 4.8 cm. Atherosclerotic changes of the aortic arch and descending thoracic aorta. No periaortic fluid. Pulmonary arteries: No central, lobar, segmental, or proximal subsegmental  filling defects. Mediastinum/Nodes: Surgical changes of the thyroid. Unremarkable thoracic inlet. Unremarkable thoracic esophagus with small hiatal hernia. No mediastinal adenopathy. Lungs/Pleura: Central airways are clear. No pleural effusion. No confluent airspace disease. Mild scarring/atelectasis in the dependent lungs.  No pneumothorax. Upper Abdomen: No acute. Musculoskeletal: No acute displaced fracture. Degenerative changes of the spine. Scoliotic curvature again noted. Osseous hemangioma thoracic vertebral body T5. Review of the MIP images confirms the above findings. IMPRESSION: CT negative for pulmonary embolism.  No acute CT finding. Ascending aorta estimated 4.8 cm. Recommend semi-annual imaging followup by CTA or MRA and referral to cardiothoracic surgery if not already obtained. This recommendation follows 2010 ACCF/AHA/AATS/ACR/ASA/SCA/SCAI/SIR/STS/SVM Guidelines for the Diagnosis and Management of Patients With Thoracic Aortic Disease. Circulation. 2010; 121: V784-O962. Aortic aneurysm NOS (ICD10-I71.9) Aortic Atherosclerosis (ICD10-I70.0). Associated coronary artery disease. Electronically Signed   By: Gilmer Mor D.O.   On: 12/09/2020 16:23   DG Chest Port 1 View  Result Date: 12/08/2020 CLINICAL DATA:  Cough, shortness of breath, weakness. Generalized body aches and fatigue. EXAM: PORTABLE CHEST 1 VIEW COMPARISON:  None. FINDINGS: Upper normal heart size. Aortic tortuosity. Minimal vague bibasilar opacities. No pulmonary edema, pleural effusion or pneumothorax. No acute osseous abnormalities are seen. IMPRESSION: Minimal vague bibasilar opacities, atelectasis versus pneumonia. Electronically Signed   By: Narda Rutherford M.D.   On: 12/08/2020 23:04     TODAY-DAY OF DISCHARGE:  Subjective:   Phyllis Caldwell today has no headache,no chest abdominal pain,no new weakness tingling or numbness, feels much better wants to go home today.   Objective:   Blood pressure 127/73, pulse (!)  57, temperature 97.9 F (36.6 C), temperature source Oral, resp. rate 20, height 5\' 7"  (1.702 m), weight 79.6 kg, SpO2 93 %.  Intake/Output Summary (Last 24 hours) at 12/11/2020 1047 Last data filed at 12/11/2020 0909 Gross per 24 hour  Intake 260 ml  Output --  Net 260 ml   Filed Weights   12/10/20 0400  Weight: 79.6 kg    Exam: Awake Alert, Oriented *3, No new F.N deficits, Normal affect [place].AT,PERRAL Supple Neck,No JVD, No cervical lymphadenopathy appriciated.  Symmetrical Chest wall movement, Good air movement bilaterally, CTAB RRR,No Gallops,Rubs or new Murmurs, No Parasternal Heave +ve B.Sounds, Abd Soft, Non tender, No organomegaly appriciated, No rebound -guarding or rigidity. No Cyanosis, Clubbing or edema, No new Rash or bruise   PERTINENT RADIOLOGIC STUDIES: CT ANGIO CHEST PE W OR WO CONTRAST  Result Date: 12/09/2020 CLINICAL DATA:  80 year old female with a history of possible pulmonary embolism EXAM: CT ANGIOGRAPHY CHEST WITH CONTRAST TECHNIQUE: Multidetector CT imaging of the chest was performed using the standard protocol during bolus administration of intravenous contrast. Multiplanar CT image reconstructions and MIPs were obtained to evaluate the vascular anatomy. CONTRAST:  89mL OMNIPAQUE IOHEXOL 350 MG/ML SOLN COMPARISON:  02/21/2016 FINDINGS: Cardiovascular: Heart: Cardiomegaly again demonstrated. No pericardial fluid/thickening. Calcifications of left anterior descending, circumflex, right coronary arteries. Redemonstration of focal subendocardial fibrofatty remodeling of the free wall left ventricle. Aorta: Ascending aorta diameter is estimated 4.8 cm. Atherosclerotic changes of the aortic arch and descending thoracic aorta. No periaortic fluid. Pulmonary arteries: No central, lobar, segmental, or proximal subsegmental filling defects. Mediastinum/Nodes: Surgical changes of the thyroid. Unremarkable thoracic inlet. Unremarkable thoracic esophagus with small hiatal  hernia. No mediastinal adenopathy. Lungs/Pleura: Central airways are clear. No pleural effusion. No confluent airspace disease. Mild scarring/atelectasis in the dependent lungs.  No pneumothorax. Upper Abdomen: No acute. Musculoskeletal: No acute displaced fracture. Degenerative changes of the spine. Scoliotic curvature again noted. Osseous hemangioma thoracic vertebral body T5. Review of the MIP images confirms the above findings. IMPRESSION: CT negative for pulmonary embolism.  No acute CT finding. Ascending aorta estimated 4.8 cm. Recommend semi-annual imaging followup by CTA or MRA and referral to cardiothoracic surgery if not already obtained. This recommendation follows 2010 ACCF/AHA/AATS/ACR/ASA/SCA/SCAI/SIR/STS/SVM Guidelines for the Diagnosis and Management of Patients With Thoracic Aortic Disease. Circulation. 2010; 121: D924-Q683. Aortic aneurysm NOS (ICD10-I71.9) Aortic Atherosclerosis (ICD10-I70.0). Associated coronary artery disease. Electronically Signed   By: Gilmer Mor D.O.   On: 12/09/2020 16:23   DG Chest Port 1 View  Result Date: 12/08/2020 CLINICAL DATA:  Cough, shortness of breath, weakness. Generalized body aches and fatigue. EXAM: PORTABLE CHEST 1 VIEW COMPARISON:  None. FINDINGS: Upper normal heart size. Aortic tortuosity. Minimal vague bibasilar opacities. No pulmonary edema, pleural effusion or pneumothorax. No acute osseous abnormalities are seen. IMPRESSION: Minimal vague bibasilar opacities, atelectasis versus pneumonia. Electronically Signed   By: Narda Rutherford M.D.   On: 12/08/2020 23:04     PERTINENT LAB RESULTS: CBC: Recent Labs    12/10/20 0210 12/11/20 0419  WBC 4.4 10.7*  HGB 12.1 12.4  HCT 36.5 37.8  PLT 169 221   CMET CMP     Component Value Date/Time   NA 142 12/11/2020 0419   NA 142 12/02/2019 1037   K 4.2 12/11/2020 0419   CL 110 12/11/2020 0419   CO2 24 12/11/2020 0419   GLUCOSE 137 (H) 12/11/2020 0419   BUN 24 (H) 12/11/2020 0419   BUN  20 12/02/2019 1037   CREATININE 1.05 (H) 12/11/2020 0419   CALCIUM 9.7 12/11/2020 0419   PROT 6.0 (L) 12/11/2020 0419   PROT 6.1 12/02/2019 1037   ALBUMIN 3.3 (L) 12/11/2020 0419   ALBUMIN 3.9 12/02/2019 1037   AST 18 12/11/2020 0419   ALT 12 12/11/2020 0419  ALKPHOS 75 12/11/2020 0419   BILITOT 0.2 (L) 12/11/2020 0419   BILITOT 0.5 12/02/2019 1037   GFRNONAA 54 (L) 12/11/2020 0419   GFRAA 62 12/02/2019 1037    GFR Estimated Creatinine Clearance: 47.2 mL/min (A) (by C-G formula based on SCr of 1.05 mg/dL (H)). No results for input(s): LIPASE, AMYLASE in the last 72 hours. No results for input(s): CKTOTAL, CKMB, CKMBINDEX, TROPONINI in the last 72 hours. Invalid input(s): POCBNP Recent Labs    12/10/20 0210 12/11/20 0419  DDIMER 1.09* 0.59*   No results for input(s): HGBA1C in the last 72 hours. No results for input(s): CHOL, HDL, LDLCALC, TRIG, CHOLHDL, LDLDIRECT in the last 72 hours. Recent Labs    12/09/20 0049  TSH 0.262*   Recent Labs    12/10/20 0210 12/11/20 0419  FERRITIN 286 376*   Coags: No results for input(s): INR in the last 72 hours.  Invalid input(s): PT Microbiology: Recent Results (from the past 240 hour(s))  Urine culture     Status: Abnormal   Collection Time: 12/08/20 10:25 PM   Specimen: Urine, Random  Result Value Ref Range Status   Specimen Description URINE, RANDOM  Final   Special Requests   Final    NONE Performed at [facility], [address], [place], [place] [address]    Culture MULTIPLE SPECIES PRESENT, SUGGEST RECOLLECTION (A)  Final   Report Status 12/10/2020 FINAL  Final  Blood culture (routine x 2)     Status: None (Preliminary result)   Collection Time: 12/09/20 12:40 AM   Specimen: BLOOD RIGHT HAND  Result Value Ref Range Status   Specimen Description BLOOD RIGHT HAND  Final   Special Requests   Final    BOTTLES DRAWN AEROBIC AND ANAEROBIC Blood Culture adequate volume   Culture   Final    NO GROWTH 2  DAYS Performed at [facility], [place] [address], [place], [place] [address]    Report Status PENDING  Incomplete  Resp Panel by RT-PCR (Flu A&B, Covid) Nasopharyngeal Swab     Status: Abnormal   Collection Time: 12/09/20  1:05 AM   Specimen: Nasopharyngeal Swab; Nasopharyngeal(NP) swabs in vial transport medium  Result Value Ref Range Status   SARS Coronavirus 2 by RT PCR POSITIVE (A) NEGATIVE Final    Comment: RESULT CALLED TO, READ BACK BY AND VERIFIED WITH: S WIRTZ RN 12/09/20 0314 JDW (NOTE) SARS-CoV-2 target nucleic acids are DETECTED.  The SARS-CoV-2 RNA is generally detectable in upper respiratory specimens during the acute phase of infection. Positive results are indicative of the presence of the identified virus, but do not rule out bacterial infection or co-infection with other pathogens not detected by the test. Clinical correlation with patient history and other diagnostic information is necessary to determine patient infection status. The expected result is Negative.  Fact Sheet for Patients: EntrepreneurPulse.com.au  Fact Sheet for Healthcare Providers: IncredibleEmployment.be  This test is not yet approved or cleared by the [place] FDA and  has been authorized for detection and/or diagnosis of SARS-CoV-2 by FDA under an Emergency Use Authorization (EUA).  This EUA will remain in effect (meaning this test can be used ) for the duration of  the COVID-19 declaration under Section 564(b)(1) of the Act, 21 U.S.C. section 360bbb-3(b)(1), unless the authorization is terminated or revoked sooner.     Influenza A by PCR NEGATIVE NEGATIVE Final   Influenza B by PCR NEGATIVE NEGATIVE Final    Comment: (NOTE) The Xpert  Xpress SARS-CoV-2/FLU/RSV plus assay is intended as an aid in the diagnosis of influenza from Nasopharyngeal swab specimens and should not be used as a sole basis for treatment. Nasal washings and aspirates are  unacceptable for Xpert Xpress SARS-CoV-2/FLU/RSV testing.  Fact Sheet for Patients: BloggerCourse.com  Fact Sheet for Healthcare Providers: SeriousBroker.it  This test is not yet approved or cleared by the [place] FDA and has been authorized for detection and/or diagnosis of SARS-CoV-2 by FDA under an Emergency Use Authorization (EUA). This EUA will remain in effect (meaning this test can be used) for the duration of the COVID-19 declaration under Section 564(b)(1) of the Act, 21 U.S.C. section 360bbb-3(b)(1), unless the authorization is terminated or revoked.  Performed at [facility], [address], [place], [place] [address]   Blood culture (routine x 2)     Status: None (Preliminary result)   Collection Time: 12/09/20  9:21 AM   Specimen: BLOOD  Result Value Ref Range Status   Specimen Description BLOOD RIGHT ANTECUBITAL  Final   Special Requests   Final    BOTTLES DRAWN AEROBIC AND ANAEROBIC Blood Culture adequate volume   Culture   Final    NO GROWTH 2 DAYS Performed at [facility], [address], [place], [place] [address]    Report Status PENDING  Incomplete    FURTHER DISCHARGE INSTRUCTIONS:  Get Medicines reviewed and adjusted: Please take all your medications with you for your next visit with your Primary MD  Laboratory/radiological data: Please request your Primary MD to go over all hospital tests and procedure/radiological results at the follow up, please ask your Primary MD to get all Hospital records sent to his/her office.  In some cases, they will be blood work, cultures and biopsy results pending at the time of your discharge. Please request that your primary care M.D. goes through all the records of your hospital data and follows up on these results.  Also Note the following: If you experience worsening of your admission symptoms, develop shortness of breath, life threatening  emergency, suicidal or homicidal thoughts you must seek medical attention immediately by calling 911 or calling your MD immediately  if symptoms less severe.  You must read complete instructions/literature along with all the possible adverse reactions/side effects for all the Medicines you take and that have been prescribed to you. Take any new Medicines after you have completely understood and accpet all the possible adverse reactions/side effects.   Do not drive when taking Pain medications or sleeping medications (Benzodaizepines)  Do not take more than prescribed Pain, Sleep and Anxiety Medications. It is not advisable to combine anxiety,sleep and pain medications without talking with your primary care practitioner  Special Instructions: If you have smoked or chewed Tobacco  in the last 2 yrs please stop smoking, stop any regular Alcohol  and or any Recreational drug use.  Wear Seat belts while driving.  Please note: You were cared for by a hospitalist during your hospital stay. Once you are discharged, your primary care physician will handle any further medical issues. Please note that NO REFILLS for any discharge medications will be authorized once you are discharged, as it is imperative that you return to your primary care physician (or establish a relationship with a primary care physician if you do not have one) for your post hospital discharge needs so that they can reassess your need for medications and monitor your lab values.  Total Time spent coordinating discharge including counseling,  education and face to face time equals 35 minutes.  SignedJeoffrey Massed 12/11/2020 10:47 AM

## 2020-12-11 NOTE — Discharge Instructions (Signed)
Person Under Monitoring Name: Phyllis Caldwell  Location: [address] [place] [address]   Infection Prevention Recommendations for Individuals Confirmed to have, or Being Evaluated for, 2019 Novel Coronavirus (COVID-19) Infection Who Receive Care at Home  Individuals who are confirmed to have, or are being evaluated for, COVID-19 should follow the prevention steps below until a healthcare provider or local or state health department says they can return to normal activities.  Stay home except to get medical care You should restrict activities outside your home, except for getting medical care. Do not go to work, school, or public areas, and do not use public transportation or taxis.  Call ahead before visiting your doctor Before your medical appointment, call the healthcare provider and tell them that you have, or are being evaluated for, COVID-19 infection. This will help the healthcare provider's office take steps to keep other people from getting infected. Ask your healthcare provider to call the local or state health department.  Monitor your symptoms Seek prompt medical attention if your illness is worsening (e.g., difficulty breathing). Before going to your medical appointment, call the healthcare provider and tell them that you have, or are being evaluated for, COVID-19 infection. Ask your healthcare provider to call the local or state health department.  Wear a facemask You should wear a facemask that covers your nose and mouth when you are in the same room with other people and when you visit a healthcare provider. People who live with or visit you should also wear a facemask while they are in the same room with you.  Separate yourself from other people in your home As much as possible, you should stay in a different room from other people in your home. Also, you should use a separate bathroom, if available.  Avoid sharing household items You should  not share dishes, drinking glasses, cups, eating utensils, towels, bedding, or other items with other people in your home. After using these items, you should wash them thoroughly with soap and water.  Cover your coughs and sneezes Cover your mouth and nose with a tissue when you cough or sneeze, or you can cough or sneeze into your sleeve. Throw used tissues in a lined trash can, and immediately wash your hands with soap and water for at least 20 seconds or use an alcohol-based hand rub.  Wash your [employer] your hands often and thoroughly with soap and water for at least 20 seconds. You can use an alcohol-based hand sanitizer if soap and water are not available and if your hands are not visibly dirty. Avoid touching your eyes, nose, and mouth with unwashed hands.   Prevention Steps for Caregivers and Household Members of Individuals Confirmed to have, or Being Evaluated for, COVID-19 Infection Being Cared for in the Home  If you live with, or provide care at home for, a person confirmed to have, or being evaluated for, COVID-19 infection please follow these guidelines to prevent infection:  Follow healthcare provider's instructions Make sure that you understand and can help the patient follow any healthcare provider instructions for all care.  Provide for the patient's basic needs You should help the patient with basic needs in the home and provide support for getting groceries, prescriptions, and other personal needs.  Monitor the patient's symptoms If they are getting sicker, call his or her medical provider and tell them that the patient has, or is being evaluated for, COVID-19 infection. This will help the healthcare  provider's office take steps to keep other people from getting infected. Ask the healthcare provider to call the local or state health department.  Limit the number of people who have contact with the patient  If possible, have only one caregiver for the  patient.  Other household members should stay in another home or place of residence. If this is not possible, they should stay  in another room, or be separated from the patient as much as possible. Use a separate bathroom, if available.  Restrict visitors who do not have an essential need to be in the home.  Keep older adults, very young children, and other sick people away from the patient Keep older adults, very young children, and those who have compromised immune systems or chronic health conditions away from the patient. This includes people with chronic heart, lung, or kidney conditions, diabetes, and cancer.  Ensure good ventilation Make sure that shared spaces in the home have good air flow, such as from an air conditioner or an opened window, weather permitting.  Wash your hands often  Wash your hands often and thoroughly with soap and water for at least 20 seconds. You can use an alcohol based hand sanitizer if soap and water are not available and if your hands are not visibly dirty.  Avoid touching your eyes, nose, and mouth with unwashed hands.  Use disposable paper towels to dry your hands. If not available, use dedicated cloth towels and replace them when they become wet.  Wear a facemask and gloves  Wear a disposable facemask at all times in the room and gloves when you touch or have contact with the patient's blood, body fluids, and/or secretions or excretions, such as sweat, saliva, sputum, nasal mucus, vomit, urine, or feces.  Ensure the mask fits over your nose and mouth tightly, and do not touch it during use.  Throw out disposable facemasks and gloves after using them. Do not reuse.  Wash your hands immediately after removing your facemask and gloves.  If your personal clothing becomes contaminated, carefully remove clothing and launder. Wash your hands after handling contaminated clothing.  Place all used disposable facemasks, gloves, and other waste in a lined  container before disposing them with other household waste.  Remove gloves and wash your hands immediately after handling these items.  Do not share dishes, glasses, or other household items with the patient  Avoid sharing household items. You should not share dishes, drinking glasses, cups, eating utensils, towels, bedding, or other items with a patient who is confirmed to have, or being evaluated for, COVID-19 infection.  After the person uses these items, you should wash them thoroughly with soap and water.  Wash laundry thoroughly  Immediately remove and wash clothes or bedding that have blood, body fluids, and/or secretions or excretions, such as sweat, saliva, sputum, nasal mucus, vomit, urine, or feces, on them.  Wear gloves when handling laundry from the patient.  Read and follow directions on labels of laundry or clothing items and detergent. In general, wash and dry with the warmest temperatures recommended on the label.  Clean all areas the individual has used often  Clean all touchable surfaces, such as counters, tabletops, doorknobs, bathroom fixtures, toilets, phones, keyboards, tablets, and bedside tables, every day. Also, clean any surfaces that may have blood, body fluids, and/or secretions or excretions on them.  Wear gloves when cleaning surfaces the patient has come in contact with.  Use a diluted bleach solution (e.g., dilute bleach with  1 part bleach and 10 parts water) or a household disinfectant with a label that says EPA-registered for coronaviruses. To make a bleach solution at home, add 1 tablespoon of bleach to 1 quart (4 cups) of water. For a larger supply, add  cup of bleach to 1 gallon (16 cups) of water.  Read labels of cleaning products and follow recommendations provided on product labels. Labels contain instructions for safe and effective use of the cleaning product including precautions you should take when applying the product, such as wearing gloves or  eye protection and making sure you have good ventilation during use of the product.  Remove gloves and wash hands immediately after cleaning.  Monitor yourself for signs and symptoms of illness Caregivers and household members are considered close contacts, should monitor their health, and will be asked to limit movement outside of the home to the extent possible. Follow the monitoring steps for close contacts listed on the symptom monitoring form.   ? If you have additional questions, contact your local health department or call the epidemiologist on call at [phone] (available 24/7). ? This guidance is subject to change. For the most up-to-date guidance from [facility], please refer to their website: YouBlogs.pl

## 2020-12-11 NOTE — TOC Transition Note (Signed)
Transition of Care [facility]) - CM/SW Discharge Note   Patient Details  Name: Phyllis Caldwell MRN: [id number] Date of Birth: 24-Jan-1941  Transition of Care [facility]) CM/SW Contact:  Lockie Pares, RN Phone Number: 12/11/2020, 10:20 AM   Clinical Narrative:     Dan Humphreys ordered via [employer] for patient, no further needs identified. No HH needed per PT evaluation. Patient to be DC today.     Barriers to Discharge: Continued Medical Work up   Patient Goals and CMS Choice        Discharge Placement                       Discharge Plan and Services   Discharge Planning Services: CM Consult            DME Arranged: Dan Humphreys DME Agency: [employer] Date DME Agency Contacted: 12/11/20 Time DME Agency Contacted: 1020 Representative spoke with at DME Agency: Lucretia            Social Determinants of Health (SDOH) Interventions     Readmission Risk Interventions No flowsheet data found.

## 2020-12-11 NOTE — Progress Notes (Signed)
Patient was discharged home with [facility] by MD order; discharged instructions  review and give to patient with care notes; IV DIC; skin intact; the walker will be delivered home by [employer] agency. Patient will be escorted to the car by nurse tech via wheelchair.

## 2020-12-14 DIAGNOSIS — U071 COVID-19: Secondary | ICD-10-CM | POA: Diagnosis not present

## 2020-12-14 DIAGNOSIS — I712 Thoracic aortic aneurysm, without rupture: Secondary | ICD-10-CM | POA: Diagnosis not present

## 2020-12-14 DIAGNOSIS — R42 Dizziness and giddiness: Secondary | ICD-10-CM | POA: Diagnosis not present

## 2020-12-14 DIAGNOSIS — I700 Atherosclerosis of aorta: Secondary | ICD-10-CM | POA: Diagnosis not present

## 2020-12-14 DIAGNOSIS — G894 Chronic pain syndrome: Secondary | ICD-10-CM | POA: Diagnosis not present

## 2020-12-14 LAB — CULTURE, BLOOD (ROUTINE X 2)
Culture: NO GROWTH
Culture: NO GROWTH
Special Requests: ADEQUATE
Special Requests: ADEQUATE

## 2020-12-17 DIAGNOSIS — I129 Hypertensive chronic kidney disease with stage 1 through stage 4 chronic kidney disease, or unspecified chronic kidney disease: Secondary | ICD-10-CM | POA: Diagnosis not present

## 2020-12-17 DIAGNOSIS — J439 Emphysema, unspecified: Secondary | ICD-10-CM | POA: Diagnosis not present

## 2020-12-17 DIAGNOSIS — F322 Major depressive disorder, single episode, severe without psychotic features: Secondary | ICD-10-CM | POA: Diagnosis not present

## 2020-12-17 DIAGNOSIS — M5090 Cervical disc disorder, unspecified, unspecified cervical region: Secondary | ICD-10-CM | POA: Diagnosis not present

## 2020-12-17 DIAGNOSIS — U071 COVID-19: Secondary | ICD-10-CM | POA: Diagnosis not present

## 2020-12-17 DIAGNOSIS — G894 Chronic pain syndrome: Secondary | ICD-10-CM | POA: Diagnosis not present

## 2020-12-17 DIAGNOSIS — I2510 Atherosclerotic heart disease of native coronary artery without angina pectoris: Secondary | ICD-10-CM | POA: Diagnosis not present

## 2020-12-17 DIAGNOSIS — M1990 Unspecified osteoarthritis, unspecified site: Secondary | ICD-10-CM | POA: Diagnosis not present

## 2020-12-17 DIAGNOSIS — N183 Chronic kidney disease, stage 3 unspecified: Secondary | ICD-10-CM | POA: Diagnosis not present

## 2020-12-22 DIAGNOSIS — J439 Emphysema, unspecified: Secondary | ICD-10-CM | POA: Diagnosis not present

## 2020-12-22 DIAGNOSIS — U071 COVID-19: Secondary | ICD-10-CM | POA: Diagnosis not present

## 2020-12-22 DIAGNOSIS — N183 Chronic kidney disease, stage 3 unspecified: Secondary | ICD-10-CM | POA: Diagnosis not present

## 2020-12-22 DIAGNOSIS — I129 Hypertensive chronic kidney disease with stage 1 through stage 4 chronic kidney disease, or unspecified chronic kidney disease: Secondary | ICD-10-CM | POA: Diagnosis not present

## 2020-12-22 DIAGNOSIS — F322 Major depressive disorder, single episode, severe without psychotic features: Secondary | ICD-10-CM | POA: Diagnosis not present

## 2020-12-22 DIAGNOSIS — G894 Chronic pain syndrome: Secondary | ICD-10-CM | POA: Diagnosis not present

## 2020-12-22 DIAGNOSIS — I2510 Atherosclerotic heart disease of native coronary artery without angina pectoris: Secondary | ICD-10-CM | POA: Diagnosis not present

## 2020-12-22 DIAGNOSIS — M1990 Unspecified osteoarthritis, unspecified site: Secondary | ICD-10-CM | POA: Diagnosis not present

## 2020-12-22 DIAGNOSIS — M5090 Cervical disc disorder, unspecified, unspecified cervical region: Secondary | ICD-10-CM | POA: Diagnosis not present

## 2020-12-24 DIAGNOSIS — G894 Chronic pain syndrome: Secondary | ICD-10-CM | POA: Diagnosis not present

## 2020-12-24 DIAGNOSIS — M1990 Unspecified osteoarthritis, unspecified site: Secondary | ICD-10-CM | POA: Diagnosis not present

## 2020-12-24 DIAGNOSIS — J439 Emphysema, unspecified: Secondary | ICD-10-CM | POA: Diagnosis not present

## 2020-12-24 DIAGNOSIS — N183 Chronic kidney disease, stage 3 unspecified: Secondary | ICD-10-CM | POA: Diagnosis not present

## 2020-12-24 DIAGNOSIS — M5090 Cervical disc disorder, unspecified, unspecified cervical region: Secondary | ICD-10-CM | POA: Diagnosis not present

## 2020-12-24 DIAGNOSIS — U071 COVID-19: Secondary | ICD-10-CM | POA: Diagnosis not present

## 2020-12-24 DIAGNOSIS — I2510 Atherosclerotic heart disease of native coronary artery without angina pectoris: Secondary | ICD-10-CM | POA: Diagnosis not present

## 2020-12-24 DIAGNOSIS — F322 Major depressive disorder, single episode, severe without psychotic features: Secondary | ICD-10-CM | POA: Diagnosis not present

## 2020-12-24 DIAGNOSIS — I129 Hypertensive chronic kidney disease with stage 1 through stage 4 chronic kidney disease, or unspecified chronic kidney disease: Secondary | ICD-10-CM | POA: Diagnosis not present

## 2020-12-29 DIAGNOSIS — F322 Major depressive disorder, single episode, severe without psychotic features: Secondary | ICD-10-CM | POA: Diagnosis not present

## 2020-12-29 DIAGNOSIS — I129 Hypertensive chronic kidney disease with stage 1 through stage 4 chronic kidney disease, or unspecified chronic kidney disease: Secondary | ICD-10-CM | POA: Diagnosis not present

## 2020-12-29 DIAGNOSIS — U071 COVID-19: Secondary | ICD-10-CM | POA: Diagnosis not present

## 2020-12-29 DIAGNOSIS — M1990 Unspecified osteoarthritis, unspecified site: Secondary | ICD-10-CM | POA: Diagnosis not present

## 2020-12-29 DIAGNOSIS — N183 Chronic kidney disease, stage 3 unspecified: Secondary | ICD-10-CM | POA: Diagnosis not present

## 2020-12-29 DIAGNOSIS — J439 Emphysema, unspecified: Secondary | ICD-10-CM | POA: Diagnosis not present

## 2020-12-29 DIAGNOSIS — M5090 Cervical disc disorder, unspecified, unspecified cervical region: Secondary | ICD-10-CM | POA: Diagnosis not present

## 2020-12-29 DIAGNOSIS — G894 Chronic pain syndrome: Secondary | ICD-10-CM | POA: Diagnosis not present

## 2020-12-29 DIAGNOSIS — I2510 Atherosclerotic heart disease of native coronary artery without angina pectoris: Secondary | ICD-10-CM | POA: Diagnosis not present

## 2021-01-06 DIAGNOSIS — I2510 Atherosclerotic heart disease of native coronary artery without angina pectoris: Secondary | ICD-10-CM | POA: Diagnosis not present

## 2021-01-06 DIAGNOSIS — I129 Hypertensive chronic kidney disease with stage 1 through stage 4 chronic kidney disease, or unspecified chronic kidney disease: Secondary | ICD-10-CM | POA: Diagnosis not present

## 2021-01-06 DIAGNOSIS — J439 Emphysema, unspecified: Secondary | ICD-10-CM | POA: Diagnosis not present

## 2021-01-06 DIAGNOSIS — N183 Chronic kidney disease, stage 3 unspecified: Secondary | ICD-10-CM | POA: Diagnosis not present

## 2021-01-06 DIAGNOSIS — U071 COVID-19: Secondary | ICD-10-CM | POA: Diagnosis not present

## 2021-01-06 DIAGNOSIS — F322 Major depressive disorder, single episode, severe without psychotic features: Secondary | ICD-10-CM | POA: Diagnosis not present

## 2021-01-06 DIAGNOSIS — M5090 Cervical disc disorder, unspecified, unspecified cervical region: Secondary | ICD-10-CM | POA: Diagnosis not present

## 2021-01-06 DIAGNOSIS — G894 Chronic pain syndrome: Secondary | ICD-10-CM | POA: Diagnosis not present

## 2021-01-06 DIAGNOSIS — M1990 Unspecified osteoarthritis, unspecified site: Secondary | ICD-10-CM | POA: Diagnosis not present

## 2021-01-10 DIAGNOSIS — I2510 Atherosclerotic heart disease of native coronary artery without angina pectoris: Secondary | ICD-10-CM | POA: Diagnosis not present

## 2021-01-10 DIAGNOSIS — M5090 Cervical disc disorder, unspecified, unspecified cervical region: Secondary | ICD-10-CM | POA: Diagnosis not present

## 2021-01-10 DIAGNOSIS — F322 Major depressive disorder, single episode, severe without psychotic features: Secondary | ICD-10-CM | POA: Diagnosis not present

## 2021-01-10 DIAGNOSIS — N183 Chronic kidney disease, stage 3 unspecified: Secondary | ICD-10-CM | POA: Diagnosis not present

## 2021-01-10 DIAGNOSIS — U071 COVID-19: Secondary | ICD-10-CM | POA: Diagnosis not present

## 2021-01-10 DIAGNOSIS — G894 Chronic pain syndrome: Secondary | ICD-10-CM | POA: Diagnosis not present

## 2021-01-10 DIAGNOSIS — I129 Hypertensive chronic kidney disease with stage 1 through stage 4 chronic kidney disease, or unspecified chronic kidney disease: Secondary | ICD-10-CM | POA: Diagnosis not present

## 2021-01-10 DIAGNOSIS — M1990 Unspecified osteoarthritis, unspecified site: Secondary | ICD-10-CM | POA: Diagnosis not present

## 2021-01-10 DIAGNOSIS — J439 Emphysema, unspecified: Secondary | ICD-10-CM | POA: Diagnosis not present

## 2021-01-11 ENCOUNTER — Other Ambulatory Visit: Payer: Self-pay | Admitting: Physician Assistant

## 2021-01-11 DIAGNOSIS — R1310 Dysphagia, unspecified: Secondary | ICD-10-CM | POA: Diagnosis not present

## 2021-01-11 DIAGNOSIS — K625 Hemorrhage of anus and rectum: Secondary | ICD-10-CM | POA: Diagnosis not present

## 2021-01-11 DIAGNOSIS — R109 Unspecified abdominal pain: Secondary | ICD-10-CM | POA: Diagnosis not present

## 2021-01-12 ENCOUNTER — Other Ambulatory Visit: Payer: Self-pay | Admitting: Physician Assistant

## 2021-01-12 DIAGNOSIS — R109 Unspecified abdominal pain: Secondary | ICD-10-CM

## 2021-01-20 DIAGNOSIS — M1990 Unspecified osteoarthritis, unspecified site: Secondary | ICD-10-CM | POA: Diagnosis not present

## 2021-01-20 DIAGNOSIS — M5090 Cervical disc disorder, unspecified, unspecified cervical region: Secondary | ICD-10-CM | POA: Diagnosis not present

## 2021-01-20 DIAGNOSIS — I129 Hypertensive chronic kidney disease with stage 1 through stage 4 chronic kidney disease, or unspecified chronic kidney disease: Secondary | ICD-10-CM | POA: Diagnosis not present

## 2021-01-20 DIAGNOSIS — J439 Emphysema, unspecified: Secondary | ICD-10-CM | POA: Diagnosis not present

## 2021-01-20 DIAGNOSIS — F322 Major depressive disorder, single episode, severe without psychotic features: Secondary | ICD-10-CM | POA: Diagnosis not present

## 2021-01-20 DIAGNOSIS — I2510 Atherosclerotic heart disease of native coronary artery without angina pectoris: Secondary | ICD-10-CM | POA: Diagnosis not present

## 2021-01-20 DIAGNOSIS — U071 COVID-19: Secondary | ICD-10-CM | POA: Diagnosis not present

## 2021-01-20 DIAGNOSIS — G894 Chronic pain syndrome: Secondary | ICD-10-CM | POA: Diagnosis not present

## 2021-01-20 DIAGNOSIS — N183 Chronic kidney disease, stage 3 unspecified: Secondary | ICD-10-CM | POA: Diagnosis not present

## 2021-01-26 ENCOUNTER — Encounter: Payer: Medicare HMO | Admitting: Surgery

## 2021-02-01 IMAGING — DX DG SHOULDER 2+V*R*
3 series · 3 of 3 positions shown · non-contrast
Comparison: None.

CLINICAL DATA: Deformity.  Indeterminate trauma history.

EXAM:
RIGHT SHOULDER - 2+ VIEW

[shoulder ap]
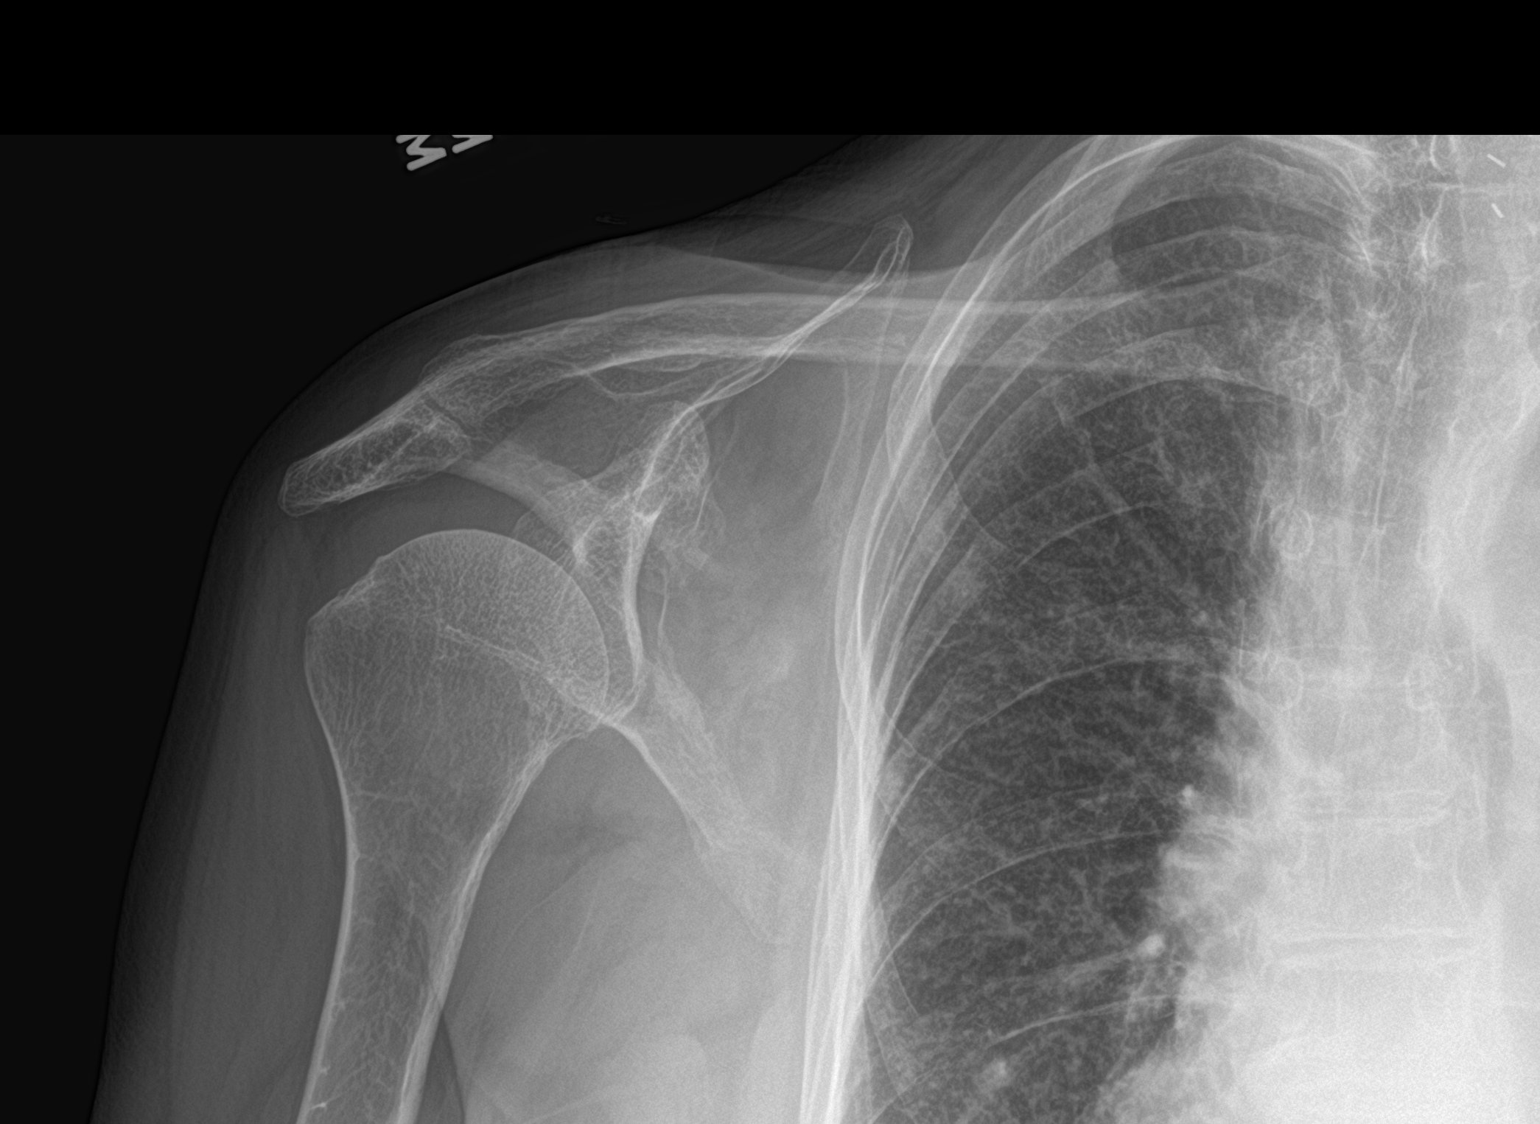

[shoulder grashey]
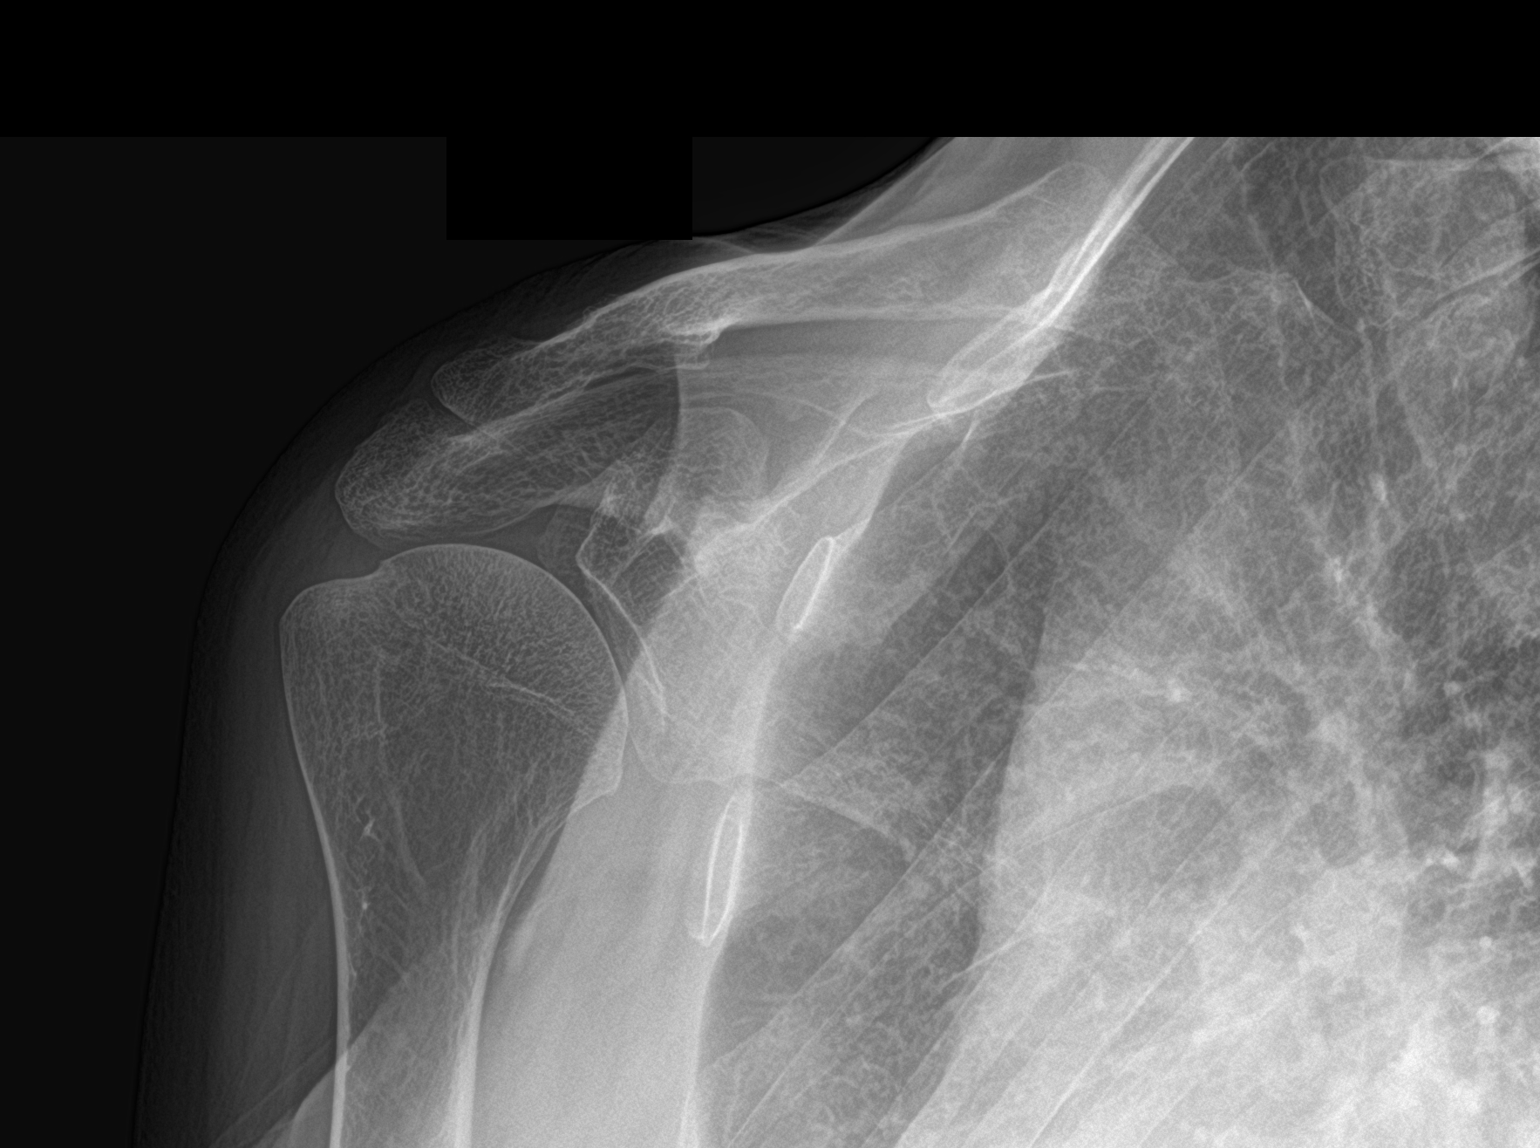

[shoulder y-view]
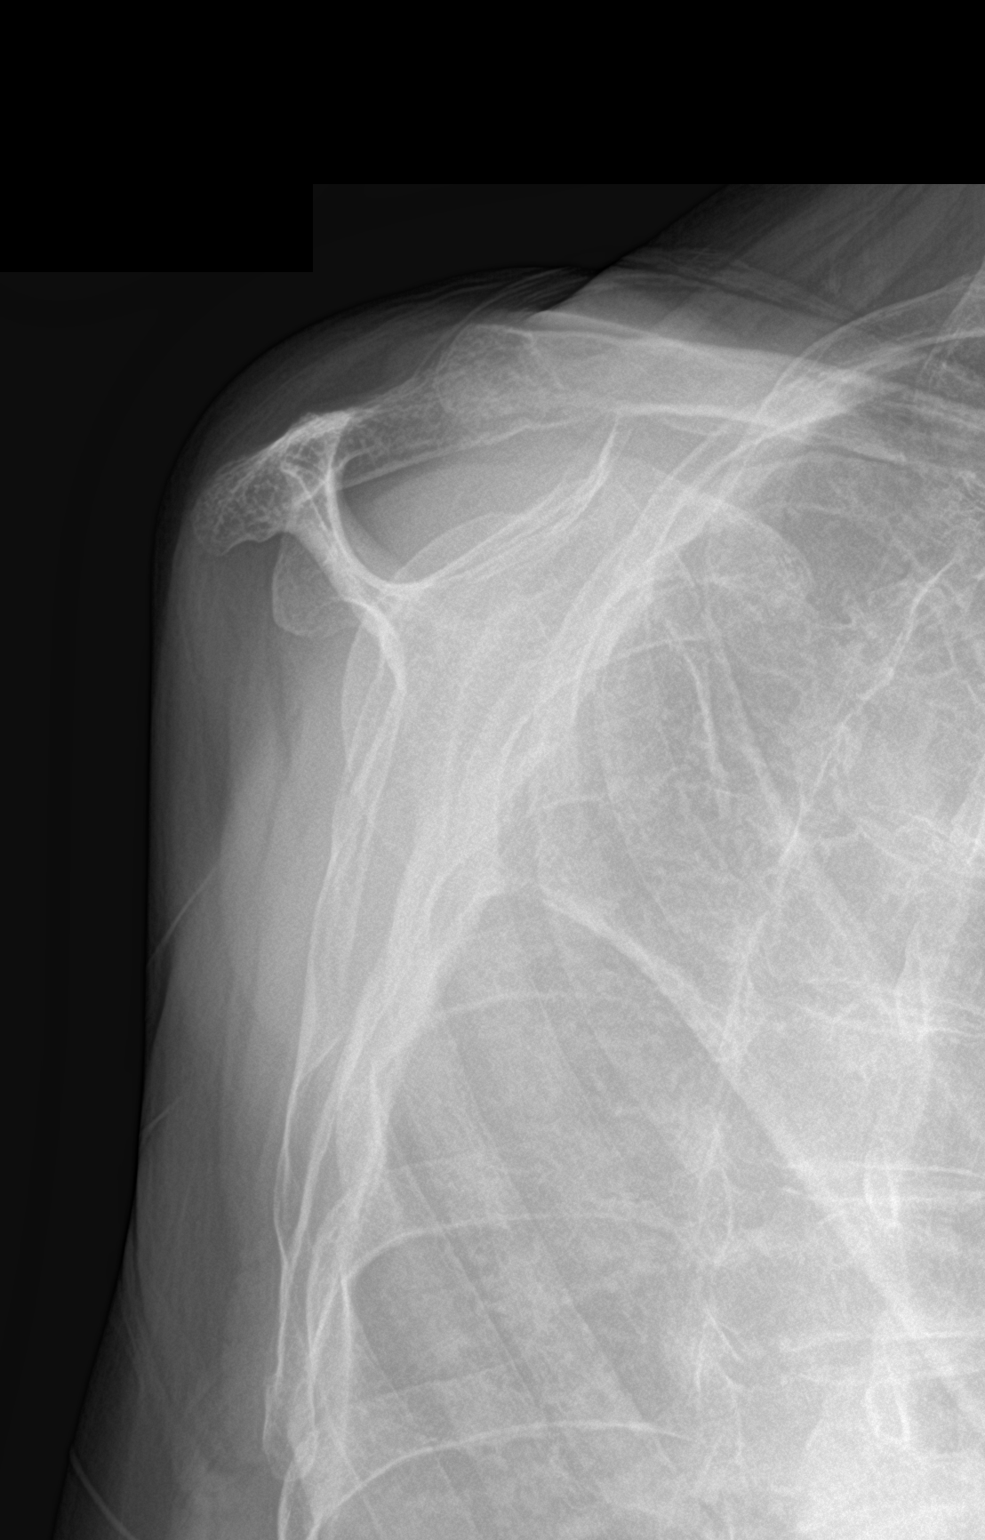

[3 of 3 positions shown; findings below may reference images not displayed]

FINDINGS: No acute fracture or dislocation. Degenerative changes of the
acromioclavicular joint.
IMPRESSION: No acute osseous abnormality.

## 2021-02-03 DIAGNOSIS — I2510 Atherosclerotic heart disease of native coronary artery without angina pectoris: Secondary | ICD-10-CM | POA: Diagnosis not present

## 2021-02-03 DIAGNOSIS — U071 COVID-19: Secondary | ICD-10-CM | POA: Diagnosis not present

## 2021-02-03 DIAGNOSIS — I129 Hypertensive chronic kidney disease with stage 1 through stage 4 chronic kidney disease, or unspecified chronic kidney disease: Secondary | ICD-10-CM | POA: Diagnosis not present

## 2021-02-03 DIAGNOSIS — M1990 Unspecified osteoarthritis, unspecified site: Secondary | ICD-10-CM | POA: Diagnosis not present

## 2021-02-03 DIAGNOSIS — J439 Emphysema, unspecified: Secondary | ICD-10-CM | POA: Diagnosis not present

## 2021-02-03 DIAGNOSIS — M5090 Cervical disc disorder, unspecified, unspecified cervical region: Secondary | ICD-10-CM | POA: Diagnosis not present

## 2021-02-03 DIAGNOSIS — F322 Major depressive disorder, single episode, severe without psychotic features: Secondary | ICD-10-CM | POA: Diagnosis not present

## 2021-02-03 DIAGNOSIS — N183 Chronic kidney disease, stage 3 unspecified: Secondary | ICD-10-CM | POA: Diagnosis not present

## 2021-02-03 DIAGNOSIS — G894 Chronic pain syndrome: Secondary | ICD-10-CM | POA: Diagnosis not present

## 2021-02-04 ENCOUNTER — Ambulatory Visit
Admission: RE | Admit: 2021-02-04 | Discharge: 2021-02-04 | Disposition: A | Discharge status: Home / Self Care | Payer: Medicare HMO | Source: Ambulatory Visit | Attending: Physician Assistant | Admitting: Physician Assistant

## 2021-02-04 ENCOUNTER — Other Ambulatory Visit: Payer: Self-pay

## 2021-02-04 DIAGNOSIS — R109 Unspecified abdominal pain: Secondary | ICD-10-CM | POA: Diagnosis not present

## 2021-02-04 DIAGNOSIS — R197 Diarrhea, unspecified: Secondary | ICD-10-CM | POA: Diagnosis not present

## 2021-02-04 DIAGNOSIS — Z85850 Personal history of malignant neoplasm of thyroid: Secondary | ICD-10-CM | POA: Diagnosis not present

## 2021-02-04 DIAGNOSIS — E890 Postprocedural hypothyroidism: Secondary | ICD-10-CM | POA: Diagnosis not present

## 2021-02-04 MED ORDER — IOPAMIDOL (ISOVUE-300) INJECTION 61%
100.0000 mL | Freq: Once | INTRAVENOUS | Status: AC | PRN
  Administered 2021-02-04: 100 mL via INTRAVENOUS

## 2021-02-09 ENCOUNTER — Institutional Professional Consult (permissible substitution) (INDEPENDENT_AMBULATORY_CARE_PROVIDER_SITE_OTHER): Payer: Medicare HMO | Admitting: Surgery

## 2021-02-09 ENCOUNTER — Other Ambulatory Visit: Payer: Self-pay

## 2021-02-09 VITALS — BP 112/77 | HR 90 | Resp 20 | Ht 67.0 in | Wt 174.0 lb

## 2021-02-09 DIAGNOSIS — I712 Thoracic aortic aneurysm, without rupture, unspecified: Secondary | ICD-10-CM

## 2021-02-10 ENCOUNTER — Encounter: Payer: Self-pay | Admitting: Surgery

## 2021-02-10 NOTE — Progress Notes (Signed)
Cardiothoracic Surgery Consultation  PCP is Mila Palmer, MD Referring Provider is Mila Palmer, MD  Chief Complaint  Patient presents with  . Thoracic Aortic Aneurysm    Surgical consult, Chest CTA 12/09/20    HPI:  The patient is a 80 year old woman with a history of hypertension, previous smoking and COPD, asthma, and coronary artery disease who was admitted in January 2022 with acute hypoxic respiratory failure due to possible COVID-19 pneumonia.  She rapidly improved on room air after treatment with a combination of steroid and remdesivir.  She had a CTA of the chest to rule out pulmonary embolism which was negative for PE but did show a 4.8 cm fusiform ascending aortic aneurysm.  She also had a CT of the abdomen and pelvis on 02/04/2021 and working up some abdominal pain and diarrhea.  This showed a left common and left internal iliac artery aneurysm measuring up to 1.5 cm but was otherwise unremarkable.  She denies any previous known history of aneurysm.  She does report that one of her sisters has an ascending aortic aneurysm.  There is no family history of connective tissue disorder or bicuspid aortic valve disease.  There is no family history of aortic dissection. Past Medical History:  Diagnosis Date  . Asthma   . Cataract   . COPD (chronic obstructive pulmonary disease) (HCC)   . Coronary artery disease   . Depression   . Hypertension   . Poor dentition   . Thyroid disease   . Tobacco use     Past Surgical History:  Procedure Laterality Date  . ABDOMINAL HYSTERECTOMY    . LEFT HEART CATHETERIZATION WITH CORONARY ANGIOGRAM N/A 10/07/2012   Procedure: LEFT HEART CATHETERIZATION WITH CORONARY ANGIOGRAM;  Surgeon: Robynn Pane, MD;  Location: [facility] CATH LAB;  Service: Cardiovascular;  Laterality: N/A;  . THYROID SURGERY      Family History  Problem Relation Age of Onset  . Heart disease Mother   . Heart attack Mother   . Stroke Mother   . Heart attack Father    . Prostate cancer Father     Social History Social History   Tobacco Use  . Smoking status: Former Smoker    Packs/day: 1.00    Types: Cigarettes    Quit date: 11/01/2017    Years since quitting: 3.2  . Smokeless tobacco: Never Used  Vaping Use  . Vaping Use: Never used  Substance Use Topics  . Alcohol use: No  . Drug use: No    Current Outpatient Medications  Medication Sig Dispense Refill  . albuterol (VENTOLIN HFA) 108 (90 Base) MCG/ACT inhaler Inhale 2 puffs into the lungs every 6 (six) hours as needed for wheezing or shortness of breath. 6.7 g 0  . amLODipine (NORVASC) 5 MG tablet Take 1 tablet (5 mg total) by mouth daily. Reported on 01/05/2016 30 tablet 0  . aspirin EC 81 MG tablet Take 162 mg by mouth daily.    . benzonatate (TESSALON PERLES) 100 MG capsule Take 1 capsule (100 mg total) by mouth every 6 (six) hours as needed for cough. 30 capsule 0  . cholecalciferol (VITAMIN D) 1000 units tablet Take 1,000 Units by mouth daily.    Marland Kitchen co-enzyme Q-10 30 MG capsule Take 30 mg by mouth every Monday, Wednesday, and Friday.    Marland Kitchen HYDROcodone-acetaminophen (NORCO/VICODIN) 5-325 MG tablet Take 1 tablet by mouth as needed for moderate pain.    Marland Kitchen levothyroxine (SYNTHROID, LEVOTHROID) 50 MCG tablet Take 1  tablet (50 mcg total) by mouth daily before breakfast. Reported on 01/05/2016 30 tablet 0  . pantoprazole (PROTONIX) 40 MG tablet Take 1 tablet (40 mg total) by mouth daily at 12 noon. 30 tablet 3  . predniSONE (DELTASONE) 10 MG tablet Take 40 mg daily for 1 day, 30 mg daily for 1 day, 20 mg daily for 1 days,10 mg daily for 1 day, then stop 10 tablet 0  . rosuvastatin (CRESTOR) 10 MG tablet Take 10 mg by mouth every Monday, Wednesday, and Friday.    . telmisartan (MICARDIS) 20 MG tablet Take 20 mg by mouth daily.    . vitamin B-12 (CYANOCOBALAMIN) 500 MCG tablet Take 500 mcg by mouth daily.     No current facility-administered medications for this visit.    Allergies  Allergen  Reactions  . Bee Venom Hives  . Coconut Fatty Acids     unknown  . Codeine Nausea And Vomiting  . Penicillins     Patient doesn't remember  . Sulfa Antibiotics     unknown    Review of Systems  Constitutional: Positive for fatigue.  HENT: Positive for dental problem.   Eyes: Positive for visual disturbance.  Respiratory: Positive for cough.   Cardiovascular: Positive for leg swelling.  Gastrointestinal: Positive for abdominal pain, constipation and diarrhea.  Endocrine: Negative.   Genitourinary: Positive for frequency.  Musculoskeletal: Positive for arthralgias, joint swelling and myalgias.  Allergic/Immunologic: Negative.   Neurological: Positive for dizziness, speech difficulty, numbness and headaches.       Memory problem  Hematological: Bruises/bleeds easily.    BP 112/77   Pulse 90   Resp 20   Ht 5\' 7"  (1.702 m)   Wt 174 lb (78.9 kg)   SpO2 95% Comment: RA  BMI 27.25 kg/m  Physical Exam Constitutional:      Appearance: Normal appearance. She is normal weight.  HENT:     Head: Normocephalic and atraumatic.  Eyes:     Extraocular Movements: Extraocular movements intact.     Conjunctiva/sclera: Conjunctivae normal.     Pupils: Pupils are equal, round, and reactive to light.  Neck:     Vascular: No carotid bruit.  Cardiovascular:     Rate and Rhythm: Normal rate and regular rhythm.     Pulses: Normal pulses.     Heart sounds: Normal heart sounds. No murmur heard.   Pulmonary:     Effort: Pulmonary effort is normal.     Breath sounds: Normal breath sounds.  Abdominal:     General: There is no distension.     Tenderness: There is no abdominal tenderness.  Musculoskeletal:        General: No swelling.     Cervical back: Normal range of motion.  Lymphadenopathy:     Cervical: No cervical adenopathy.  Skin:    General: Skin is warm and dry.  Neurological:     General: No focal deficit present.     Mental Status: She is alert and oriented to person,  place, and time.  Psychiatric:        Mood and Affect: Mood normal.        Behavior: Behavior normal.      Diagnostic Tests:  Narrative & Impression  CLINICAL DATA:  80 year old female with a history of possible pulmonary embolism  EXAM: CT ANGIOGRAPHY CHEST WITH CONTRAST  TECHNIQUE: Multidetector CT imaging of the chest was performed using the standard protocol during bolus administration of intravenous contrast. Multiplanar CT image reconstructions and MIPs  were obtained to evaluate the vascular anatomy.  CONTRAST:  65mL OMNIPAQUE IOHEXOL 350 MG/ML SOLN  COMPARISON:  02/21/2016  FINDINGS: Cardiovascular:  Heart:  Cardiomegaly again demonstrated. No pericardial fluid/thickening. Calcifications of left anterior descending, circumflex, right coronary arteries. Redemonstration of focal subendocardial fibrofatty remodeling of the free wall left ventricle.  Aorta:  Ascending aorta diameter is estimated 4.8 cm. Atherosclerotic changes of the aortic arch and descending thoracic aorta. No periaortic fluid.  Pulmonary arteries:  No central, lobar, segmental, or proximal subsegmental filling defects.  Mediastinum/Nodes: Surgical changes of the thyroid. Unremarkable thoracic inlet.  Unremarkable thoracic esophagus with small hiatal hernia. No mediastinal adenopathy.  Lungs/Pleura: Central airways are clear. No pleural effusion. No confluent airspace disease.  Mild scarring/atelectasis in the dependent lungs.  No pneumothorax.  Upper Abdomen: No acute.  Musculoskeletal: No acute displaced fracture. Degenerative changes of the spine. Scoliotic curvature again noted. Osseous hemangioma thoracic vertebral body T5.  Review of the MIP images confirms the above findings.  IMPRESSION: CT negative for pulmonary embolism.  No acute CT finding.  Ascending aorta estimated 4.8 cm. Recommend semi-annual imaging followup by CTA or MRA and referral to  cardiothoracic surgery if not already obtained. This recommendation follows 2010 ACCF/AHA/AATS/ACR/ASA/SCA/SCAI/SIR/STS/SVM Guidelines for the Diagnosis and Management of Patients With Thoracic Aortic Disease. Circulation. 2010; 121: Z610-R604: E266-e369. Aortic aneurysm NOS (ICD10-I71.9)  Aortic Atherosclerosis (ICD10-I70.0). Associated coronary artery disease.   Electronically Signed   By: Gilmer MorJaime  Wagner D.O.   On: 12/09/2020 16:23    Narrative & Impression  CLINICAL DATA:  Abd pain Symptoms x 1 month Pt reports diarrhea and bright red blood Hx thyroid ca - removed Hx hyster Former smoker Hx  EXAM: CT ABDOMEN AND PELVIS WITH CONTRAST  TECHNIQUE: Multidetector CT imaging of the abdomen and pelvis was performed using the standard protocol following bolus administration of intravenous contrast.  CONTRAST:  100mL ISOVUE-300 IOPAMIDOL (ISOVUE-300) INJECTION 61%  COMPARISON:  None.  FINDINGS: Lower chest: Atelectasis versus scarring subpleurally within the bilateral lower lobes. Coronary artery calcifications. No acute abnormality.  Hepatobiliary: The hepatic parenchyma is diffusely hypodense compared to the splenic parenchyma consistent with fatty infiltration. No focal liver abnormality. No gallstones, gallbladder wall thickening, or pericholecystic fluid. No biliary dilatation.  Pancreas: No focal lesion. Normal pancreatic contour. No surrounding inflammatory changes. No main pancreatic ductal dilatation.  Spleen: Subcentimeter hypodensities are too small to characterize. Otherwise normal in size without focal abnormality.  Adrenals/Urinary Tract: No adrenal nodule bilaterally. Bilateral kidneys enhance symmetrically. Subcentimeter hypodensities too small to characterize. No hydronephrosis. No hydroureter. The urinary bladder is unremarkable. On delayed imaging, there is no urothelial wall thickening and there are no filling defects in the opacified portions of the  bilateral collecting systems or ureters.  Stomach/Bowel: PO contrast reaches the hepatic flexure. Stomach is within normal limits. No evidence of bowel wall thickening or dilatation. Appendix appears normal.  Vascular/Lymphatic: No abdominal aorta aneurysm. The left common iliac and left internal iliac arteries are enlarged in caliber measuring up to 1.5 cm. Moderate atherosclerotic plaque of the aorta and its branches. No abdominal, pelvic, or inguinal lymphadenopathy.  Reproductive: Status post hysterectomy. No adnexal masses.  Other: No intraperitoneal free fluid. No intraperitoneal free gas. No organized fluid collection.  Musculoskeletal:  No abdominal wall hernia or abnormality  Diffusely decreased bone density. No suspicious lytic or blastic osseous lesions. No acute displaced fracture. Grade 1 anterolisthesis of L4 on L5 and L5 on S1. Associated facet arthropathy. Chronic appearing vertebral body height loss of the L5  vertebral body.  IMPRESSION: 1. Left common and left internal iliac arteries are aneurysmal measuring up to 1.5 cm. 2. Hepatic steatosis. 3. No acute intra-abdominal or intrapelvic abnormality. 4.  Aortic Atherosclerosis (ICD10-I70.0).   Electronically Signed   By: Tish Frederickson M.D.   On: 02/05/2021 16:43      Impression:  This 80 year old woman has a 4.8 cm fusiform ascending aortic aneurysm noted incidentally on recent chest CT.  Echocardiogram in 01/2019 showed a trileaflet aortic valve with trivial regurgitation and no stenosis.  She had a CT scan of the chest without contrast in March 2017 which I have personally reviewed and the ascending aorta measured about 4.4 cm at that time.  Her aortic aneurysm is still well below the surgical threshold of 5.5 cm.  I reviewed the CT images with her and her daughter and answered their questions.  I stressed the importance of good blood pressure control in preventing further enlargement and acute  aortic dissection.  Plan:  I recommended that she have a follow-up CTA of the chest in 1 year.  I spent 40 minutes performing this consultation and > 50% of this time was spent face to face counseling and coordinating the care of this patient's ascending aortic aneurysm.   Alleen Borne, MD [facility] [phone]

## 2021-03-08 ENCOUNTER — Encounter: Payer: Self-pay | Admitting: Vascular Surgery

## 2021-03-08 ENCOUNTER — Other Ambulatory Visit: Payer: Self-pay

## 2021-03-08 ENCOUNTER — Ambulatory Visit: Payer: Medicare HMO | Admitting: Vascular Surgery

## 2021-03-08 DIAGNOSIS — I723 Aneurysm of iliac artery: Secondary | ICD-10-CM | POA: Diagnosis not present

## 2021-03-08 NOTE — Progress Notes (Signed)
Patient name: Phyllis Caldwell MRN: [id number] DOB: March 04, 1941 Sex: female  REASON FOR CONSULT: Evaluate 1.5 cm aneurysm of left common iliac artery and left internal iliac artery  HPI: Phyllis Caldwell is a 80 y.o. female, with history of COPD, hypertension, remote tobacco abuse that presents for evaluation of 1.5 cm aneurysm of the left common iliac and internal iliac artery.  Patient had no prior knowledge of these aneurysms.  She was having some GI issues with blood in her stool.  Had a CT abdomen pelvis on 02/05/2021 for further work-up of her abdominal pain.  Most of her pain is up underneath the left rib cage.  She states she quit smoking about 3 years ago.  She lives with her daughter independently.  Past Medical History:  Diagnosis Date  . Asthma   . Cataract   . COPD (chronic obstructive pulmonary disease) (HCC)   . Coronary artery disease   . Depression   . Hypertension   . Poor dentition   . Thyroid disease   . Tobacco use     Past Surgical History:  Procedure Laterality Date  . ABDOMINAL HYSTERECTOMY    . LEFT HEART CATHETERIZATION WITH CORONARY ANGIOGRAM N/A 10/07/2012   Procedure: LEFT HEART CATHETERIZATION WITH CORONARY ANGIOGRAM;  Surgeon: Robynn Pane, MD;  Location: [facility] CATH LAB;  Service: Cardiovascular;  Laterality: N/A;  . THYROID SURGERY      Family History  Problem Relation Age of Onset  . Heart disease Mother   . Heart attack Mother   . Stroke Mother   . Heart attack Father   . Prostate cancer Father     SOCIAL HISTORY: Social History   Socioeconomic History  . Marital status: Widowed    Spouse name: Not on file  . Number of children: Not on file  . Years of education: Not on file  . Highest education level: Not on file  Occupational History  . Not on file  Tobacco Use  . Smoking status: Former Smoker    Packs/day: 1.00    Types: Cigarettes    Quit date: 11/01/2017    Years since quitting: 3.3  . Smokeless tobacco: Never Used  Vaping  Use  . Vaping Use: Never used  Substance and Sexual Activity  . Alcohol use: No  . Drug use: No  . Sexual activity: Never  Other Topics Concern  . Not on file  Social History Narrative  . Not on file   Social Determinants of Health   Financial Resource Strain: Not on file  Food Insecurity: Not on file  Transportation Needs: Not on file  Physical Activity: Not on file  Stress: Not on file  Social Connections: Not on file  Intimate Partner Violence: Not on file    Allergies  Allergen Reactions  . Bee Venom Hives  . Coconut Fatty Acids     unknown  . Codeine Nausea And Vomiting  . Penicillins     Patient doesn't remember  . Sulfa Antibiotics     unknown    Current Outpatient Medications  Medication Sig Dispense Refill  . albuterol (VENTOLIN HFA) 108 (90 Base) MCG/ACT inhaler Inhale 2 puffs into the lungs every 6 (six) hours as needed for wheezing or shortness of breath. 6.7 g 0  . amLODipine (NORVASC) 5 MG tablet Take 1 tablet (5 mg total) by mouth daily. Reported on 01/05/2016 30 tablet 0  . aspirin EC 81 MG tablet Take 162 mg by mouth daily.    Marland Kitchen  benzonatate (TESSALON PERLES) 100 MG capsule Take 1 capsule (100 mg total) by mouth every 6 (six) hours as needed for cough. 30 capsule 0  . cholecalciferol (VITAMIN D) 1000 units tablet Take 1,000 Units by mouth daily.    Marland Kitchen co-enzyme Q-10 30 MG capsule Take 30 mg by mouth every Monday, Wednesday, and Friday.    Marland Kitchen HYDROcodone-acetaminophen (NORCO/VICODIN) 5-325 MG tablet Take 1 tablet by mouth as needed for moderate pain.    Marland Kitchen levothyroxine (SYNTHROID, LEVOTHROID) 50 MCG tablet Take 1 tablet (50 mcg total) by mouth daily before breakfast. Reported on 01/05/2016 30 tablet 0  . pantoprazole (PROTONIX) 40 MG tablet Take 1 tablet (40 mg total) by mouth daily at 12 noon. 30 tablet 3  . rosuvastatin (CRESTOR) 10 MG tablet Take 10 mg by mouth every Monday, Wednesday, and Friday.    . telmisartan (MICARDIS) 20 MG tablet Take 20 mg by mouth  daily.    . vitamin B-12 (CYANOCOBALAMIN) 500 MCG tablet Take 500 mcg by mouth daily.    . predniSONE (DELTASONE) 10 MG tablet Take 40 mg daily for 1 day, 30 mg daily for 1 day, 20 mg daily for 1 days,10 mg daily for 1 day, then stop (Patient not taking: Reported on 03/08/2021) 10 tablet 0   No current facility-administered medications for this visit.    REVIEW OF SYSTEMS:  [X]  denotes positive finding, [ ]  denotes negative finding Cardiac  Comments:  Chest pain or chest pressure: x   Shortness of breath upon exertion: x   Short of breath when lying flat: x   Irregular heart rhythm:        Vascular    Pain in calf, thigh, or hip brought on by ambulation:    Pain in feet at night that wakes you up from your sleep:     Blood clot in your veins:    Leg swelling:  x       Pulmonary    Oxygen at home:    Productive cough:     Wheezing:         Neurologic    Sudden weakness in arms or legs:     Sudden numbness in arms or legs:     Sudden onset of difficulty speaking or slurred speech:    Temporary loss of vision in one eye:     Problems with dizziness:  x       Gastrointestinal    Blood in stool:     Vomited blood:         Genitourinary    Burning when urinating:     Blood in urine:        Psychiatric    Major depression:         Hematologic    Bleeding problems:    Problems with blood clotting too easily:        Skin    Rashes or ulcers:        Constitutional    Fever or chills:      PHYSICAL EXAM: Vitals:   03/08/21 1555  BP: 106/65  Pulse: 74  Resp: 16  Temp: 97.7 F (36.5 C)  TempSrc: Temporal  SpO2: 98%  Weight: 173 lb (78.5 kg)  Height: 5\' 9"  (1.753 m)    GENERAL: The patient is a well-nourished female, in no acute distress. The vital signs are documented above. CARDIAC: There is a regular rate and rhythm.  VASCULAR:  Palpable femoral pulses bilateral groins PULMONARY: No respiratory distress.  ABDOMEN: Soft and non-tender.  Reproducible pain up  under left costal margin. MUSCULOSKELETAL: There are no major deformities or cyanosis. NEUROLOGIC: No focal weakness or paresthesias are detected. SKIN: There are no ulcers or rashes noted. PSYCHIATRIC: The patient has a normal affect.  DATA:   I reviewed the CT abdomen pelvis from 02/05/2021 and she has some ectasia of the left common and internal iliac artery measuring 1.5 cm.  Assessment/Plan:  80 year old female presents for evaluation of 1.5 cm left common iliac and left internal iliac artery aneurysms.  I reviewed the CT scan with the patient and her daughter and discussed these are incidental findings.  I would classify these more as ectatic arteries at this time rather than a true aneurysm.  Certainly could continue to degenerate into larger aneurysms.  Discussed no role for surgical intervention at this time and discussed common iliac and internal iliac artery aneurysms are typically treated with surgery at 3 to 3.5 cm.  I recommended surveillance with another follow-up in 1 year in our [facility] with aortoiliac duplex here in our office.   Cephus Shelling, MD [facility] Office: [phone]

## 2021-03-11 DIAGNOSIS — R1011 Right upper quadrant pain: Secondary | ICD-10-CM | POA: Diagnosis not present

## 2021-03-11 DIAGNOSIS — K219 Gastro-esophageal reflux disease without esophagitis: Secondary | ICD-10-CM | POA: Diagnosis not present

## 2021-03-11 DIAGNOSIS — K649 Unspecified hemorrhoids: Secondary | ICD-10-CM | POA: Diagnosis not present

## 2021-03-11 DIAGNOSIS — R198 Other specified symptoms and signs involving the digestive system and abdomen: Secondary | ICD-10-CM | POA: Diagnosis not present

## 2021-04-22 DIAGNOSIS — N3000 Acute cystitis without hematuria: Secondary | ICD-10-CM | POA: Diagnosis not present

## 2021-04-22 DIAGNOSIS — G894 Chronic pain syndrome: Secondary | ICD-10-CM | POA: Diagnosis not present

## 2021-04-22 DIAGNOSIS — J449 Chronic obstructive pulmonary disease, unspecified: Secondary | ICD-10-CM | POA: Diagnosis not present

## 2021-04-22 DIAGNOSIS — I5043 Acute on chronic combined systolic (congestive) and diastolic (congestive) heart failure: Secondary | ICD-10-CM | POA: Diagnosis not present

## 2021-04-22 DIAGNOSIS — E039 Hypothyroidism, unspecified: Secondary | ICD-10-CM | POA: Diagnosis not present

## 2021-04-22 DIAGNOSIS — I2510 Atherosclerotic heart disease of native coronary artery without angina pectoris: Secondary | ICD-10-CM | POA: Diagnosis not present

## 2021-04-22 DIAGNOSIS — R5382 Chronic fatigue, unspecified: Secondary | ICD-10-CM | POA: Diagnosis not present

## 2021-04-22 DIAGNOSIS — U099 Post covid-19 condition, unspecified: Secondary | ICD-10-CM | POA: Diagnosis not present

## 2021-05-04 DIAGNOSIS — S61230A Puncture wound without foreign body of right index finger without damage to nail, initial encounter: Secondary | ICD-10-CM | POA: Diagnosis not present

## 2021-05-04 DIAGNOSIS — W540XXA Bitten by dog, initial encounter: Secondary | ICD-10-CM | POA: Diagnosis not present

## 2021-05-04 DIAGNOSIS — I10 Essential (primary) hypertension: Secondary | ICD-10-CM | POA: Diagnosis not present

## 2021-10-10 DIAGNOSIS — I10 Essential (primary) hypertension: Secondary | ICD-10-CM | POA: Diagnosis not present

## 2021-10-10 DIAGNOSIS — J449 Chronic obstructive pulmonary disease, unspecified: Secondary | ICD-10-CM | POA: Diagnosis not present

## 2021-10-10 DIAGNOSIS — K219 Gastro-esophageal reflux disease without esophagitis: Secondary | ICD-10-CM | POA: Diagnosis not present

## 2021-10-10 DIAGNOSIS — F0150 Vascular dementia without behavioral disturbance: Secondary | ICD-10-CM | POA: Diagnosis not present

## 2021-10-10 DIAGNOSIS — I5043 Acute on chronic combined systolic (congestive) and diastolic (congestive) heart failure: Secondary | ICD-10-CM | POA: Diagnosis not present

## 2021-10-10 DIAGNOSIS — E039 Hypothyroidism, unspecified: Secondary | ICD-10-CM | POA: Diagnosis not present

## 2021-10-10 DIAGNOSIS — J441 Chronic obstructive pulmonary disease with (acute) exacerbation: Secondary | ICD-10-CM | POA: Diagnosis not present

## 2021-10-10 DIAGNOSIS — F331 Major depressive disorder, recurrent, moderate: Secondary | ICD-10-CM | POA: Diagnosis not present

## 2021-10-10 DIAGNOSIS — E785 Hyperlipidemia, unspecified: Secondary | ICD-10-CM | POA: Diagnosis not present

## 2021-11-08 DIAGNOSIS — I25119 Atherosclerotic heart disease of native coronary artery with unspecified angina pectoris: Secondary | ICD-10-CM | POA: Diagnosis not present

## 2021-11-08 DIAGNOSIS — I771 Stricture of artery: Secondary | ICD-10-CM | POA: Diagnosis not present

## 2021-11-08 DIAGNOSIS — E559 Vitamin D deficiency, unspecified: Secondary | ICD-10-CM | POA: Diagnosis not present

## 2021-11-08 DIAGNOSIS — E785 Hyperlipidemia, unspecified: Secondary | ICD-10-CM | POA: Diagnosis not present

## 2021-11-08 DIAGNOSIS — N183 Chronic kidney disease, stage 3 unspecified: Secondary | ICD-10-CM | POA: Diagnosis not present

## 2021-11-08 DIAGNOSIS — I672 Cerebral atherosclerosis: Secondary | ICD-10-CM | POA: Diagnosis not present

## 2021-11-08 DIAGNOSIS — Z0000 Encounter for general adult medical examination without abnormal findings: Secondary | ICD-10-CM | POA: Diagnosis not present

## 2021-11-08 DIAGNOSIS — I10 Essential (primary) hypertension: Secondary | ICD-10-CM | POA: Diagnosis not present

## 2021-11-08 DIAGNOSIS — F322 Major depressive disorder, single episode, severe without psychotic features: Secondary | ICD-10-CM | POA: Diagnosis not present

## 2021-11-08 DIAGNOSIS — F0150 Vascular dementia without behavioral disturbance: Secondary | ICD-10-CM | POA: Diagnosis not present

## 2021-11-08 DIAGNOSIS — Z79899 Other long term (current) drug therapy: Secondary | ICD-10-CM | POA: Diagnosis not present

## 2022-01-10 DIAGNOSIS — F0150 Vascular dementia without behavioral disturbance: Secondary | ICD-10-CM | POA: Diagnosis not present

## 2022-01-10 DIAGNOSIS — K219 Gastro-esophageal reflux disease without esophagitis: Secondary | ICD-10-CM | POA: Diagnosis not present

## 2022-01-10 DIAGNOSIS — F331 Major depressive disorder, recurrent, moderate: Secondary | ICD-10-CM | POA: Diagnosis not present

## 2022-01-10 DIAGNOSIS — E039 Hypothyroidism, unspecified: Secondary | ICD-10-CM | POA: Diagnosis not present

## 2022-01-10 DIAGNOSIS — E785 Hyperlipidemia, unspecified: Secondary | ICD-10-CM | POA: Diagnosis not present

## 2022-01-10 DIAGNOSIS — I10 Essential (primary) hypertension: Secondary | ICD-10-CM | POA: Diagnosis not present

## 2022-01-10 DIAGNOSIS — N183 Chronic kidney disease, stage 3 unspecified: Secondary | ICD-10-CM | POA: Diagnosis not present

## 2022-01-10 DIAGNOSIS — I25119 Atherosclerotic heart disease of native coronary artery with unspecified angina pectoris: Secondary | ICD-10-CM | POA: Diagnosis not present

## 2022-01-10 DIAGNOSIS — I213 ST elevation (STEMI) myocardial infarction of unspecified site: Secondary | ICD-10-CM | POA: Diagnosis not present

## 2022-01-19 ENCOUNTER — Other Ambulatory Visit: Payer: Self-pay | Admitting: Surgery

## 2022-01-19 DIAGNOSIS — I7121 Aneurysm of the ascending aorta, without rupture: Secondary | ICD-10-CM

## 2022-03-01 ENCOUNTER — Ambulatory Visit
Admission: RE | Admit: 2022-03-01 | Discharge: 2022-03-01 | Disposition: A | Discharge status: Home / Self Care | Payer: Medicare HMO | Source: Ambulatory Visit | Attending: Surgery | Admitting: Surgery

## 2022-03-01 ENCOUNTER — Other Ambulatory Visit: Payer: Self-pay

## 2022-03-01 ENCOUNTER — Encounter: Payer: Self-pay | Admitting: Surgery

## 2022-03-01 ENCOUNTER — Ambulatory Visit: Payer: Medicare HMO | Admitting: Surgery

## 2022-03-01 VITALS — BP 167/80 | HR 55 | Resp 20 | Ht 69.0 in | Wt 173.0 lb

## 2022-03-01 DIAGNOSIS — I7121 Aneurysm of the ascending aorta, without rupture: Secondary | ICD-10-CM

## 2022-03-01 DIAGNOSIS — I712 Thoracic aortic aneurysm, without rupture, unspecified: Secondary | ICD-10-CM | POA: Diagnosis not present

## 2022-03-01 MED ORDER — IOPAMIDOL (ISOVUE-370) INJECTION 76%
75.0000 mL | Freq: Once | INTRAVENOUS | Status: AC | PRN
Start: 2022-03-01 — End: 2022-03-01
  Administered 2022-03-01: 65 mL via INTRAVENOUS

## 2022-03-01 NOTE — Progress Notes (Signed)
? ? ?HPI: ? ?The patient is a 81 year old woman with a history of hypertension, previous smoking and COPD, asthma, and coronary artery disease who had a CT of the chest in January 2022 to rule out PE which showed a 4.8 cm fusiform ascending aortic aneurysm.  A CT scan of the abdomen and pelvis at that time showed a 1.5 cm left common iliac artery aneurysm.  She returns today for follow-up CTA.  She denies any chest or back pain. ? ?Current Outpatient Medications  ?Medication Sig Dispense Refill  ? albuterol (VENTOLIN HFA) 108 (90 Base) MCG/ACT inhaler Inhale 2 puffs into the lungs every 6 (six) hours as needed for wheezing or shortness of breath. 6.7 g 0  ? aspirin EC 81 MG tablet Take 162 mg by mouth daily.    ? cholecalciferol (VITAMIN D) 1000 units tablet Take 1,000 Units by mouth daily.    ? HYDROcodone-acetaminophen (NORCO/VICODIN) 5-325 MG tablet Take 1 tablet by mouth as needed for moderate pain.    ? levothyroxine (SYNTHROID, LEVOTHROID) 50 MCG tablet Take 1 tablet (50 mcg total) by mouth daily before breakfast. Reported on 01/05/2016 30 tablet 0  ? pantoprazole (PROTONIX) 40 MG tablet Take 1 tablet (40 mg total) by mouth daily at 12 noon. 30 tablet 3  ? telmisartan (MICARDIS) 20 MG tablet Take 20 mg by mouth daily.    ? vitamin B-12 (CYANOCOBALAMIN) 500 MCG tablet Take 500 mcg by mouth daily.    ? ?No current facility-administered medications for this visit.  ? ? ? ?Physical Exam: ?BP (!) 167/80 (BP Location: Right Arm, Patient Position: Sitting)   Pulse (!) 55   Resp 20   Ht 5\' 9"  (1.753 m)   Wt 173 lb (78.5 kg)   SpO2 98% Comment: RA  BMI 25.55 kg/m?  ?She looks well. ?Cardiac exam shows a regular rate and rhythm with normal heart sounds. ?Lungs are clear. ?There is mild bilateral ankle and pedal edema. ? ?Diagnostic Tests: ? ?Narrative & Impression  ?CLINICAL DATA:  Follow-up ascending aortic aneurysm. History of ?thyroid cancer status post surgery. ?  ?EXAM: ?CT ANGIOGRAPHY CHEST WITH CONTRAST ?   ?TECHNIQUE: ?Multidetector CT imaging of the chest was performed using the ?standard protocol during bolus administration of intravenous ?contrast. Multiplanar CT image reconstructions and MIPs were ?obtained to evaluate the vascular anatomy. ?  ?RADIATION DOSE REDUCTION: This exam was performed according to the ?departmental dose-optimization program which includes automated ?exposure control, adjustment of the mA and/or kV according to ?patient size and/or use of iterative reconstruction technique. ?  ?CONTRAST:  56mL ISOVUE-370 IOPAMIDOL (ISOVUE-370) INJECTION 76% ?  ?COMPARISON:  CT angio chest 12/09/2020 ?  ?FINDINGS: ?Cardiovascular: Ascending thoracic aorta diameter measures 4.3 cm. ?Descending thoracic aorta diameter measures 3.1 cm. Normal heart ?size. No pericardial effusion. Multivessel coronary artery ?calcification. ?  ?Mediastinum/Nodes: A 1.2 cm short axis subcarinal node is not ?significantly changed compared to 02/21/2016 (series 4, image 61). ?No enlarged hilar or axillary lymph nodes. Postoperative changes of ?the thyroid. A 0.5 cm hypodense nodule is noted in the left thyroid ?gland (series 4, image 15). ?  ?Lungs/Pleura: A 0.3 cm solid nodule in the right upper lobe is ?unchanged since 02/21/2016, consistent with benignity. No focal ?consolidation, pleural effusion, or pneumothorax. ?  ?Upper Abdomen: No acute abnormality. ?  ?Musculoskeletal: No chest wall abnormality. No acute or significant ?osseous findings. ?  ?Review of the MIP images confirms the above findings. ?  ?IMPRESSION: ?1. Ascending thoracic aortic aneurysm with diameter  measuring 4.3 ?cm. Recommend annual imaging followup by CTA or MRA. This ?recommendation follows 2010 ?ACCF/AHA/AATS/ACR/ASA/SCA/SCAI/SIR/STS/SVM Guidelines for the ?Diagnosis and Management of Patients with Thoracic Aortic Disease. ?Circulation. 2010; [id number]. Aortic aneurysm NOS (ICD10-I71.9) ?2. Incidentally noted 0.5 cm hypodense nodule in the left  thyroid ?gland. Given patient's history of thyroid cancer, recommend further ?evaluation with dedicated thyroid ultrasound. ?  ?  ?Electronically Signed ?  By: Ileana Roup M.D. ?  On: 03/01/2022 15:36 ?   ? ? ?Impression: ? ?CTA of the chest today shows a 4.3 cm fusiform ascending aortic aneurysm which is significantly smaller than the 4.8 cm measurement given for her CT study done in 11/2020 to rule out pulmonary embolism.  I think that the discrepancy is due to the different technique with no CT contrast in the aorta on her prior study.  Her aortic aneurysm is small and well below the 5.5 cm surgical threshold in an 81 year old patient who I do not think would be an operative candidate anyway.  I do not think there is a need to do further scans and she is in agreement with that.  There is also a 0.5 cm hypodense nodule in the left thyroid gland.  She has a history of remote right thyroidectomy for thyroid cancer.  I reviewed the CTA results with her and answered all of her questions.  I told her that radiology recommended a dedicated thyroid ultrasound to evaluate this left thyroid nodule further.  She is going to think about it and talk to her PCP before deciding if she wants to proceed in that direction.  I stressed the importance of continued good blood pressure control in preventing further enlargement of her aorta and acute aortic dissection. ? ?Plan: ? ?She will continue to follow-up with her PCP. ? ?I spent 15 minutes performing this established patient evaluation and > 50% of this time was spent face to face counseling and coordinating the care of this patient's aortic aneurysm.  ? ? ?Gaye Pollack, MD ?[facility] ?([phone] ? ? ? ? ? ?

## 2022-04-03 DIAGNOSIS — J441 Chronic obstructive pulmonary disease with (acute) exacerbation: Secondary | ICD-10-CM | POA: Diagnosis not present

## 2022-04-03 DIAGNOSIS — E785 Hyperlipidemia, unspecified: Secondary | ICD-10-CM | POA: Diagnosis not present

## 2022-04-03 DIAGNOSIS — E039 Hypothyroidism, unspecified: Secondary | ICD-10-CM | POA: Diagnosis not present

## 2022-04-03 DIAGNOSIS — N183 Chronic kidney disease, stage 3 unspecified: Secondary | ICD-10-CM | POA: Diagnosis not present

## 2022-04-03 DIAGNOSIS — I10 Essential (primary) hypertension: Secondary | ICD-10-CM | POA: Diagnosis not present
